# Patient Record
Sex: Female | Born: 1955
Health system: Southern US, Community
[De-identification: ages and names within clinical notes are randomized; demographics above are authoritative.]

## PROBLEM LIST (undated history)

## (undated) DIAGNOSIS — K219 Gastro-esophageal reflux disease without esophagitis: Secondary | ICD-10-CM

## (undated) DIAGNOSIS — T7840XA Allergy, unspecified, initial encounter: Secondary | ICD-10-CM

## (undated) DIAGNOSIS — F32A Depression, unspecified: Secondary | ICD-10-CM

## (undated) DIAGNOSIS — M1990 Unspecified osteoarthritis, unspecified site: Secondary | ICD-10-CM

## (undated) DIAGNOSIS — E669 Obesity, unspecified: Secondary | ICD-10-CM

## (undated) DIAGNOSIS — E782 Mixed hyperlipidemia: Secondary | ICD-10-CM

## (undated) DIAGNOSIS — I10 Essential (primary) hypertension: Secondary | ICD-10-CM

## (undated) DIAGNOSIS — G4700 Insomnia, unspecified: Secondary | ICD-10-CM

## (undated) DIAGNOSIS — F329 Major depressive disorder, single episode, unspecified: Secondary | ICD-10-CM

## (undated) DIAGNOSIS — F419 Anxiety disorder, unspecified: Secondary | ICD-10-CM

## (undated) HISTORY — DX: Mixed hyperlipidemia: E78.2

## (undated) HISTORY — DX: Major depressive disorder, single episode, unspecified: F32.9

## (undated) HISTORY — DX: Insomnia, unspecified: G47.00

## (undated) HISTORY — DX: Gastro-esophageal reflux disease without esophagitis: K21.9

## (undated) HISTORY — DX: Depression, unspecified: F32.A

## (undated) HISTORY — DX: Anxiety disorder, unspecified: F41.9

## (undated) HISTORY — DX: Obesity, unspecified: E66.9

---
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
## 1997-11-02 DIAGNOSIS — I10 Essential (primary) hypertension: Secondary | ICD-10-CM

## 1997-11-02 HISTORY — DX: Essential (primary) hypertension: I10

## 1997-11-16 DIAGNOSIS — F322 Major depressive disorder, single episode, severe without psychotic features: Secondary | ICD-10-CM | POA: Insufficient documentation

## 1998-08-04 DIAGNOSIS — T7840XA Allergy, unspecified, initial encounter: Secondary | ICD-10-CM

## 1998-08-04 HISTORY — DX: Allergy, unspecified, initial encounter: T78.40XA

## 2002-08-04 HISTORY — PX: CHOLECYSTECTOMY: SHX55

## 2005-05-27 ENCOUNTER — Ambulatory Visit: Payer: Self-pay

## 2006-04-27 ENCOUNTER — Ambulatory Visit: Payer: Self-pay | Admitting: Neurology

## 2006-06-03 ENCOUNTER — Ambulatory Visit: Payer: Self-pay

## 2008-08-04 DIAGNOSIS — M1990 Unspecified osteoarthritis, unspecified site: Secondary | ICD-10-CM

## 2008-08-04 HISTORY — DX: Unspecified osteoarthritis, unspecified site: M19.90

## 2010-04-10 ENCOUNTER — Ambulatory Visit: Payer: Self-pay

## 2011-07-01 ENCOUNTER — Ambulatory Visit: Payer: Self-pay | Admitting: "Endocrinology

## 2011-08-05 HISTORY — PX: COLONOSCOPY: SHX174

## 2013-03-03 ENCOUNTER — Ambulatory Visit: Payer: Self-pay | Admitting: Gastroenterology

## 2013-03-03 LAB — HM COLONOSCOPY

## 2013-03-04 LAB — PATHOLOGY REPORT

## 2013-03-21 ENCOUNTER — Ambulatory Visit: Payer: Self-pay | Admitting: Family Medicine

## 2014-09-19 ENCOUNTER — Ambulatory Visit: Payer: Self-pay | Admitting: Family Medicine

## 2014-09-19 DIAGNOSIS — Z1231 Encounter for screening mammogram for malignant neoplasm of breast: Secondary | ICD-10-CM | POA: Diagnosis not present

## 2014-10-05 DIAGNOSIS — F411 Generalized anxiety disorder: Secondary | ICD-10-CM | POA: Diagnosis not present

## 2014-10-10 DIAGNOSIS — K219 Gastro-esophageal reflux disease without esophagitis: Secondary | ICD-10-CM | POA: Diagnosis not present

## 2014-10-10 DIAGNOSIS — Z720 Tobacco use: Secondary | ICD-10-CM | POA: Diagnosis not present

## 2014-10-10 DIAGNOSIS — I10 Essential (primary) hypertension: Secondary | ICD-10-CM | POA: Diagnosis not present

## 2014-10-10 DIAGNOSIS — G250 Essential tremor: Secondary | ICD-10-CM | POA: Diagnosis not present

## 2015-02-20 ENCOUNTER — Encounter: Payer: Self-pay | Admitting: Family Medicine

## 2015-02-20 ENCOUNTER — Other Ambulatory Visit: Payer: Self-pay | Admitting: Family Medicine

## 2015-02-20 ENCOUNTER — Ambulatory Visit (INDEPENDENT_AMBULATORY_CARE_PROVIDER_SITE_OTHER): Payer: Medicare Other | Admitting: Family Medicine

## 2015-02-20 VITALS — BP 101/69 | HR 61 | Temp 97.6°F | Resp 16 | Ht 60.0 in | Wt 180.6 lb

## 2015-02-20 DIAGNOSIS — Z889 Allergy status to unspecified drugs, medicaments and biological substances status: Secondary | ICD-10-CM

## 2015-02-20 DIAGNOSIS — Z9109 Other allergy status, other than to drugs and biological substances: Secondary | ICD-10-CM | POA: Insufficient documentation

## 2015-02-20 DIAGNOSIS — I10 Essential (primary) hypertension: Secondary | ICD-10-CM | POA: Insufficient documentation

## 2015-02-20 DIAGNOSIS — K219 Gastro-esophageal reflux disease without esophagitis: Secondary | ICD-10-CM | POA: Insufficient documentation

## 2015-02-20 MED ORDER — PROPRANOLOL HCL 20 MG PO TABS: 20.0000 mg | ORAL_TABLET | Freq: Two times a day (BID) | ORAL | Status: DC

## 2015-02-20 MED ORDER — OMEPRAZOLE 20 MG PO CPDR: 20.0000 mg | DELAYED_RELEASE_CAPSULE | Freq: Every day | ORAL | Status: DC

## 2015-02-20 NOTE — Progress Notes (Signed)
Name: Elizabeth CollieLoretta Andrews [name]   MRN: [id number]    DOB: 09/04/1955   Date:02/20/2015       Progress Note  Subjective  Chief Complaint  Chief Complaint  Patient presents with  . Hypertension    HPI  For f/u of HBP.  BP at home never above 120 systolic.  Feeling well overall.  Seeing Psych for depression.   Past Medical History  Diagnosis Date  . Anxiety   . Insomnia   . GERD (gastroesophageal reflux disease)   . Obesity   . Depression     History  Substance Use Topics  . Smoking status: Current Every Day Smoker -- 1.50 packs/day for 40 years    Types: Cigarettes  . Smokeless tobacco: Never Used  . Alcohol Use: No     Current outpatient prescriptions:  .  citalopram (CELEXA) 40 MG tablet, , Disp: , Rfl:  .  clonazePAM (KLONOPIN) 0.5 MG tablet, 0.25 mg. , Disp: , Rfl:  .  lisinopril (PRINIVIL,ZESTRIL) 5 MG tablet, , Disp: , Rfl:  .  loratadine (ALLERGY RELIEF) 10 MG tablet, Take 10 mg by mouth daily., Disp: , Rfl:  .  omeprazole (PRILOSEC) 20 MG capsule, , Disp: , Rfl:  .  propranolol (INDERAL) 20 MG tablet, , Disp: , Rfl:  .  QUEtiapine (SEROQUEL) 300 MG tablet, , Disp: , Rfl:   No Known Allergies  Review of Systems  Constitutional: Negative.  Negative for fever, chills, weight loss and malaise/fatigue.  Eyes: Negative.  Negative for blurred vision and double vision.  Respiratory: Negative for cough, sputum production, shortness of breath and wheezing.   Cardiovascular: Negative.  Negative for chest pain, palpitations, orthopnea and leg swelling.  Gastrointestinal: Negative.  Negative for heartburn, abdominal pain, constipation and blood in stool.  Genitourinary: Negative.  Negative for dysuria, urgency and frequency.  Musculoskeletal: Negative.  Negative for myalgias and joint pain.  Skin: Negative.  Negative for rash.  Neurological: Negative.  Negative for dizziness, tingling, tremors, seizures and weakness.  Psychiatric/Behavioral: Positive for depression. The  patient has insomnia.       Objective  Filed Vitals:   02/20/15 1525  BP: 101/69  Pulse: 61  Temp: 97.6 F (36.4 C)  Resp: 16  Height: 5' (1.524 m)  Weight: 180 lb 9.6 oz (81.92 kg)     Physical Exam  Constitutional: She is oriented to person, place, and time and well-developed, well-nourished, and in no distress. No distress.  HENT:  Head: Normocephalic and atraumatic.  Eyes: Conjunctivae and EOM are normal. Pupils are equal, round, and reactive to light. No scleral icterus.  Neck: Normal range of motion. Neck supple. No thyromegaly present.  Cardiovascular: Normal rate, regular rhythm, normal heart sounds and intact distal pulses.  Exam reveals no gallop and no friction rub.   No murmur heard. Pulmonary/Chest: Effort normal and breath sounds normal. No respiratory distress. She has no wheezes. She has no rales.  Abdominal: Soft. Bowel sounds are normal. She exhibits no mass. There is no tenderness.  Musculoskeletal: She exhibits no edema.  Lymphadenopathy:    She has no cervical adenopathy.  Neurological: She is alert and oriented to person, place, and time. No cranial nerve deficit.  Psychiatric:  Affect is slightly depressed.  Vitals reviewed.       Assessment & Plan  1. Essential hypertension -discontinue Lisinopril  2. Gastroesophageal reflux disease without esophagitis   3. Multiple allergies

## 2015-03-07 DIAGNOSIS — H5213 Myopia, bilateral: Secondary | ICD-10-CM | POA: Diagnosis not present

## 2015-05-22 ENCOUNTER — Encounter: Payer: Self-pay | Admitting: Family Medicine

## 2015-05-23 ENCOUNTER — Ambulatory Visit: Payer: Medicare Other | Admitting: Family Medicine

## 2015-05-30 ENCOUNTER — Encounter: Payer: Self-pay | Admitting: Family Medicine

## 2015-05-30 ENCOUNTER — Ambulatory Visit (INDEPENDENT_AMBULATORY_CARE_PROVIDER_SITE_OTHER): Payer: Medicare Other | Admitting: Family Medicine

## 2015-05-30 VITALS — BP 122/79 | HR 65 | Temp 97.6°F | Resp 16 | Ht 60.0 in | Wt 180.6 lb

## 2015-05-30 DIAGNOSIS — Z23 Encounter for immunization: Secondary | ICD-10-CM | POA: Diagnosis not present

## 2015-05-30 DIAGNOSIS — Z0000 Encounter for general adult medical examination without abnormal findings: Secondary | ICD-10-CM

## 2015-05-30 NOTE — Progress Notes (Signed)
Patient: Elizabeth [name], Female    DOB: 01/23/1956, 59 y.o.   MRN: [id number] Visit Date: 05/30/2015  Today's Provider: Fidel LevyJames Hawkins Jr, MD  Chief Complaint  Patient presents with  . Medicare Wellness    Initial  . Manic Behavior    bipolar disorder- disabled    Subjective:   Initial preventative physical exam Elizabeth [name] is a 59 y.o. female who presents today for her Initial Preventative Physical Exam. She feels well. She reports exercising . She reports she is sleeping fairly well.  HPI  Review of Systems  Social History   Social History  . Marital Status: Widowed    Spouse Name: N/A  . Number of Children: N/A  . Years of Education: N/A   Occupational History  . Not on file.   Social History Main Topics  . Smoking status: Current Every Day Smoker -- 1.50 packs/day for 40 years    Types: Cigarettes  . Smokeless tobacco: Never Used  . Alcohol Use: No  . Drug Use: No  . Sexual Activity: Yes    Birth Control/ Protection: Injection   Other Topics Concern  . Not on file   Social History Narrative    Patient Active Problem List   Diagnosis Date Noted  . Hypertension 02/20/2015  . GERD (gastroesophageal reflux disease) 02/20/2015  . Multiple allergies 02/20/2015    Past Surgical History  Procedure Laterality Date  . Cholecystectomy  2004    Her family history includes Alzheimer's disease in her mother; Hypertension in her father and sister; Multiple sclerosis in her sister; Stroke in her father.    Previous Medications   CITALOPRAM (CELEXA) 40 MG TABLET       CLONAZEPAM (KLONOPIN) 0.5 MG TABLET    0.25 mg.    LORATADINE (ALLERGY RELIEF) 10 MG TABLET    Take 10 mg by mouth daily.   OMEPRAZOLE (PRILOSEC) 20 MG CAPSULE    Take 1 capsule (20 mg total) by mouth daily.   PROPRANOLOL (INDERAL) 20 MG TABLET    Take 1 tablet (20 mg total) by mouth 2 (two) times daily.   QUETIAPINE (SEROQUEL) 300 MG TABLET        Patient Care Team: Janeann ForehandJames H  Hawkins Jr., MD as PCP - General (Family Medicine)     Objective:   Vitals: BP 122/79 mmHg  Pulse 65  Temp(Src) 97.6 F (36.4 C) (Oral)  Resp 16  Ht 5' (1.524 m)  Wt 180 lb 9.6 oz (81.92 kg)  BMI 35.27 kg/m2  Physical Exam    Hearing Screening   125Hz  250Hz  500Hz  1000Hz  2000Hz  4000Hz  8000Hz   Right ear:   Pass Pass Pass Pass   Left ear:   Pass Pass Pass Pass   Vision Screening Comments: Pt has eye exam done in August and was normal.  Activities of Daily Living In your present state of health, do you have any difficulty performing the following activities: 05/30/2015 02/20/2015  Hearing? N N  Vision? N N  Difficulty concentrating or making decisions? N N  Walking or climbing stairs? N N  Dressing or bathing? N N  Doing errands, shopping? N N  Preparing Food and eating ? N -  Using the Toilet? N -  In the past six months, have you accidently leaked urine? N -  Do you have problems with loss of bowel control? N -  Managing your Medications? N -  Managing your Finances? N -  Housekeeping or managing your Housekeeping? N -  Fall Risk Assessment Fall Risk  05/30/2015 05/30/2015  Falls in the past year? No No     Patient reports there are not safety devices in place in shower at home.   Depression Screen PHQ 2/9 Scores 05/30/2015 02/20/2015  PHQ - 2 Score 3 1  PHQ- 9 Score 9 7    MMSE MMSE - Mini Mental State Exam 05/30/2015  Orientation to time 5  Orientation to Place 5  Registration 3  Attention/ Calculation 5  Recall 3  Language- name 2 objects 2  Language- repeat 1  Language- follow 3 step command 3  Language- read & follow direction 1  Write a sentence 1  Copy design 1  Total score 30      Assessment & Plan:     Initial Preventative Physical Exam  Reviewed patient's Family Medical History Reviewed and updated list of patient's medical providers Assessment of cognitive impairment was done Assessed patient's functional ability Established a  written schedule for health screening services Health Risk Assessent Completed and Reviewed  Exercise Activities and Dietary recommendations Goals    . Weight < 200 lb (90.719 kg)     Pt wants to loose 20 pound in one year.       Immunization History  Administered Date(s) Administered  . Pneumococcal Polysaccharide-23 02/25/2013  . Tdap 02/25/2013    Health Maintenance  Topic Date Due  . INFLUENZA VACCINE  03/04/2016  . MAMMOGRAM  07/04/2016  . PAP SMEAR  06/04/2017  . TETANUS/TDAP  02/26/2023  . COLONOSCOPY  03/05/2023  . Hepatitis C Screening  Completed  . HIV Screening  Completed      Discussed health benefits of physical activity, and encouraged her to engage in regular exercise appropriate for her age and condition.    ------------------------------------------------------------------------------------------------------------   Problem List Items Addressed This Visit    None       Venora Maples, MD [facility]  05/30/2015  1. Medicare annual wellness visit, initial   2. Need for influenza vaccination  - Flu Vaccine QUAD 36+ mos PF IM (Fluarix & Fluzone Quad PF)

## 2015-05-30 NOTE — Patient Instructions (Signed)
Health Maintenance  Topic Date Due  . INFLUENZA VACCINE  03/04/2016  . MAMMOGRAM  07/04/2016  . PAP SMEAR  06/04/2017  . TETANUS/TDAP  02/26/2023  . COLONOSCOPY  03/05/2023  . Hepatitis C Screening  Completed  . HIV Screening  Completed

## 2015-07-11 ENCOUNTER — Ambulatory Visit (INDEPENDENT_AMBULATORY_CARE_PROVIDER_SITE_OTHER): Payer: Medicare Other | Admitting: Family Medicine

## 2015-07-11 ENCOUNTER — Encounter: Payer: Self-pay | Admitting: Family Medicine

## 2015-07-11 VITALS — BP 120/82 | HR 61 | Resp 16 | Ht 60.0 in | Wt 184.5 lb

## 2015-07-11 DIAGNOSIS — Z0000 Encounter for general adult medical examination without abnormal findings: Secondary | ICD-10-CM | POA: Diagnosis not present

## 2015-07-11 DIAGNOSIS — K5792 Diverticulitis of intestine, part unspecified, without perforation or abscess without bleeding: Secondary | ICD-10-CM | POA: Diagnosis not present

## 2015-07-11 DIAGNOSIS — F32A Depression, unspecified: Secondary | ICD-10-CM

## 2015-07-11 DIAGNOSIS — I10 Essential (primary) hypertension: Secondary | ICD-10-CM

## 2015-07-11 DIAGNOSIS — F329 Major depressive disorder, single episode, unspecified: Secondary | ICD-10-CM | POA: Diagnosis not present

## 2015-07-11 DIAGNOSIS — K219 Gastro-esophageal reflux disease without esophagitis: Secondary | ICD-10-CM

## 2015-07-11 DIAGNOSIS — F39 Unspecified mood [affective] disorder: Secondary | ICD-10-CM | POA: Insufficient documentation

## 2015-07-11 NOTE — Progress Notes (Signed)
Name: Elizabeth [name]   MRN: [id number]    DOB: 1955-11-20   Date:07/11/2015       Progress Note  Subjective  Chief Complaint  Chief Complaint  Patient presents with  . Annual Exam    Bladder Control is an issue     HPI Here for annual complete physical.  Has depression and sees Psychiatrist.  Also with tremors an GERD.  Doing well overall.    No problem-specific assessment & plan notes found for this encounter.   Past Medical History  Diagnosis Date  . Anxiety   . Insomnia   . GERD (gastroesophageal reflux disease)   . Obesity   . Depression     Past Surgical History  Procedure Laterality Date  . Colonoscopy  08/05/2011  . Cholecystectomy  2004    repeat 2014    Family History  Problem Relation Age of Onset  . Alzheimer's disease Mother   . Stroke Father   . Hypertension Father   . Multiple sclerosis Sister   . Hypertension Sister     Social History   Social History  . Marital Status: Widowed    Spouse Name: N/A  . Number of Children: N/A  . Years of Education: N/A   Occupational History  . Not on file.   Social History Main Topics  . Smoking status: Current Every Day Smoker -- 1.50 packs/day for 40 years    Types: Cigarettes  . Smokeless tobacco: Never Used  . Alcohol Use: No  . Drug Use: No  . Sexual Activity: Yes    Birth Control/ Protection: Injection   Other Topics Concern  . Not on file   Social History Narrative     Current outpatient prescriptions:  .  aspirin 81 MG tablet, Take 81 mg by mouth daily., Disp: , Rfl:  .  citalopram (CELEXA) 40 MG tablet, , Disp: , Rfl:  .  clonazePAM (KLONOPIN) 0.5 MG tablet, 0.25 mg. , Disp: , Rfl:  .  loratadine (ALLERGY RELIEF) 10 MG tablet, Take 10 mg by mouth daily., Disp: , Rfl:  .  omeprazole (PRILOSEC) 20 MG capsule, Take 1 capsule (20 mg total) by mouth daily., Disp: 30 capsule, Rfl: 12 .  propranolol (INDERAL) 20 MG tablet, Take 1 tablet (20 mg total) by mouth 2 (two) times daily., Disp:  60 tablet, Rfl: 12 .  QUEtiapine (SEROQUEL) 300 MG tablet, Take 150 mg by mouth at bedtime. 1/2 tab, Disp: , Rfl:   No Known Allergies   Review of Systems  Constitutional: Negative for fever, chills, weight loss and malaise/fatigue.  HENT: Positive for congestion. Negative for hearing loss.   Eyes: Negative for blurred vision and double vision.  Respiratory: Negative for cough, shortness of breath and wheezing.   Cardiovascular: Negative for chest pain, palpitations and leg swelling.  Gastrointestinal: Positive for nausea, diarrhea and blood in stool. Negative for heartburn, abdominal pain and constipation.  Genitourinary: Negative for dysuria, urgency and frequency.  Skin: Negative for itching and rash.  Neurological: Negative for dizziness, tremors, weakness and headaches.  Psychiatric/Behavioral: Positive for depression. The patient is nervous/anxious.       Objective  Filed Vitals:   07/11/15 1520  BP: 120/82  Pulse: 61  Resp: 16  Height: 5' (1.524 m)  Weight: 184 lb 8 oz (83.689 kg)    Physical Exam  Constitutional: She is oriented to person, place, and time. No distress.  HENT:  Head: Normocephalic and atraumatic.  Right Ear: External ear normal.  Left Ear: External ear normal.  Nose: Nose normal.  Mouth/Throat: Oropharynx is clear and moist.  Eyes: Conjunctivae and EOM are normal. Pupils are equal, round, and reactive to light. No scleral icterus.  Fundoscopic exam:      The right eye shows no arteriolar narrowing, no AV nicking, no exudate, no hemorrhage and no papilledema.       The left eye shows no arteriolar narrowing, no AV nicking, no exudate, no hemorrhage and no papilledema.  Neck: Normal range of motion. Neck supple. Carotid bruit is not present. No thyromegaly present.  Cardiovascular: Normal rate, regular rhythm, normal heart sounds and intact distal pulses.  Exam reveals no gallop and no friction rub.   No murmur heard. Pulmonary/Chest: Effort normal  and breath sounds normal. No respiratory distress. She has no wheezes. She has no rales. She exhibits no tenderness. Right breast exhibits no inverted nipple, no mass, no nipple discharge, no skin change and no tenderness. Left breast exhibits no inverted nipple, no mass, no nipple discharge, no skin change and no tenderness. Breasts are symmetrical.  Abdominal: Soft. Bowel sounds are normal. She exhibits no distension, no abdominal bruit and no mass. There is no tenderness.  Genitourinary: Vagina normal, uterus normal, cervix normal, right adnexa normal and left adnexa normal.  Bimanual exam only.  Musculoskeletal: Normal range of motion. She exhibits no edema.  Lymphadenopathy:    She has no cervical adenopathy.  Neurological: She is alert and oriented to person, place, and time. No cranial nerve deficit. Gait normal. Coordination normal.  Skin: Skin is warm and dry.  Vitals reviewed.      No results found for this or any previous visit (from the past 2160 hour(s)).   Assessment & Plan  Problem List Items Addressed This Visit      Cardiovascular and Mediastinum   Hypertension   Relevant Medications   aspirin 81 MG tablet   Other Relevant Orders   Comprehensive Metabolic Panel (CMET)   Lipid Profile     Digestive   GERD (gastroesophageal reflux disease)   Relevant Orders   CBC with Differential   B12   Folate     Other   Diverticulitis   Annual physical exam - Primary   Relevant Orders   MM Digital Screening   Depression   Relevant Orders   TSH   Vitamin D (25 hydroxy)      Meds ordered this encounter  Medications  . aspirin 81 MG tablet    Sig: Take 81 mg by mouth daily.   1. Annual physical exam  - MM Digital Screening; Future  2. Essential hypertension -conmt. Atenolol - Comprehensive Metabolic Panel (CMET) - Lipid Profile  3. Diverticulitis of intestine without perforation or abscess without bleeding   4. Gastroesophageal reflux disease without  esophagitis -cont. POmeprazole - CBC with Differential - B12 - Folate  5. Depression -cont. To see Psych. - TSH - Vitamin D (25 hydroxy)

## 2015-08-08 DIAGNOSIS — I10 Essential (primary) hypertension: Secondary | ICD-10-CM | POA: Diagnosis not present

## 2015-08-08 DIAGNOSIS — K219 Gastro-esophageal reflux disease without esophagitis: Secondary | ICD-10-CM | POA: Diagnosis not present

## 2015-08-09 LAB — CBC WITH DIFFERENTIAL/PLATELET
BASOS ABS: 0.1 10*3/uL (ref 0.0–0.2)
Basos: 1 %
EOS (ABSOLUTE): 0.2 10*3/uL (ref 0.0–0.4)
EOS: 2 %
HEMATOCRIT: 44.2 % (ref 34.0–46.6)
HEMOGLOBIN: 15.1 g/dL (ref 11.1–15.9)
IMMATURE GRANS (ABS): 0 10*3/uL (ref 0.0–0.1)
Immature Granulocytes: 0 %
LYMPHS: 22 %
Lymphocytes Absolute: 1.6 10*3/uL (ref 0.7–3.1)
MCH: 31.7 pg (ref 26.6–33.0)
MCHC: 34.2 g/dL (ref 31.5–35.7)
MCV: 93 fL (ref 79–97)
MONOCYTES: 6 %
Monocytes Absolute: 0.5 10*3/uL (ref 0.1–0.9)
NEUTROS ABS: 5 10*3/uL (ref 1.4–7.0)
Neutrophils: 69 %
Platelets: 396 10*3/uL — ABNORMAL HIGH (ref 150–379)
RBC: 4.77 x10E6/uL (ref 3.77–5.28)
RDW: 13.6 % (ref 12.3–15.4)
WBC: 7.3 10*3/uL (ref 3.4–10.8)

## 2015-08-09 LAB — COMPREHENSIVE METABOLIC PANEL
ALK PHOS: 89 IU/L (ref 39–117)
ALT: 9 IU/L (ref 0–32)
AST: 16 IU/L (ref 0–40)
Albumin/Globulin Ratio: 1.6 (ref 1.1–2.5)
Albumin: 4.5 g/dL (ref 3.5–5.5)
BUN/Creatinine Ratio: 12 (ref 9–23)
BUN: 16 mg/dL (ref 6–24)
Bilirubin Total: 0.7 mg/dL (ref 0.0–1.2)
CALCIUM: 9.9 mg/dL (ref 8.7–10.2)
CHLORIDE: 100 mmol/L (ref 96–106)
CO2: 21 mmol/L (ref 18–29)
CREATININE: 1.35 mg/dL — AB (ref 0.57–1.00)
GFR calc Af Amer: 50 mL/min/{1.73_m2} — ABNORMAL LOW (ref >59)
GFR, EST NON AFRICAN AMERICAN: 43 mL/min/{1.73_m2} — AB (ref >59)
GLOBULIN, TOTAL: 2.9 g/dL (ref 1.5–4.5)
GLUCOSE: 88 mg/dL (ref 65–99)
POTASSIUM: 4.9 mmol/L (ref 3.5–5.2)
SODIUM: 142 mmol/L (ref 134–144)
Total Protein: 7.4 g/dL (ref 6.0–8.5)

## 2015-08-09 LAB — FOLATE: Folate: 13.7 ng/mL (ref >3.0)

## 2015-08-09 LAB — LIPID PANEL
CHOL/HDL RATIO: 4.4 ratio (ref 0.0–4.4)
CHOLESTEROL TOTAL: 253 mg/dL — AB (ref 100–199)
HDL: 58 mg/dL (ref >39)
LDL Calculated: 173 mg/dL — ABNORMAL HIGH (ref 0–99)
TRIGLYCERIDES: 108 mg/dL (ref 0–149)
VLDL Cholesterol Cal: 22 mg/dL (ref 5–40)

## 2015-08-09 LAB — ABN OPTION 3

## 2015-08-09 LAB — VITAMIN B12: VITAMIN B 12: 410 pg/mL (ref 211–946)

## 2015-08-09 LAB — TSH: TSH: 3.7 u[IU]/mL (ref 0.450–4.500)

## 2015-08-09 LAB — VITAMIN D 25 HYDROXY (VIT D DEFICIENCY, FRACTURES): VIT D 25 HYDROXY: 14.1 ng/mL — AB (ref 30.0–100.0)

## 2015-09-25 ENCOUNTER — Ambulatory Visit: Payer: Medicare Other

## 2015-10-02 ENCOUNTER — Other Ambulatory Visit: Payer: Self-pay | Admitting: Family Medicine

## 2015-10-02 ENCOUNTER — Encounter: Payer: Self-pay | Admitting: Family Medicine

## 2015-10-02 DIAGNOSIS — R899 Unspecified abnormal finding in specimens from other organs, systems and tissues: Secondary | ICD-10-CM

## 2015-10-09 ENCOUNTER — Ambulatory Visit
Admission: RE | Admit: 2015-10-09 | Discharge: 2015-10-09 | Disposition: A | Discharge status: Home / Self Care | Payer: Medicare Other | Source: Ambulatory Visit | Attending: Family Medicine | Admitting: Family Medicine

## 2015-10-09 DIAGNOSIS — Z0000 Encounter for general adult medical examination without abnormal findings: Secondary | ICD-10-CM

## 2015-10-09 DIAGNOSIS — Z1231 Encounter for screening mammogram for malignant neoplasm of breast: Secondary | ICD-10-CM | POA: Diagnosis not present

## 2015-10-23 ENCOUNTER — Encounter: Payer: Self-pay | Admitting: Family Medicine

## 2015-12-12 DIAGNOSIS — R899 Unspecified abnormal finding in specimens from other organs, systems and tissues: Secondary | ICD-10-CM | POA: Diagnosis not present

## 2015-12-13 LAB — COMPREHENSIVE METABOLIC PANEL
ALK PHOS: 78 IU/L (ref 39–117)
ALT: 13 IU/L (ref 0–32)
AST: 13 IU/L (ref 0–40)
Albumin/Globulin Ratio: 1.5 (ref 1.2–2.2)
Albumin: 4.1 g/dL (ref 3.6–4.8)
BILIRUBIN TOTAL: 0.3 mg/dL (ref 0.0–1.2)
BUN / CREAT RATIO: 9 — AB (ref 12–28)
BUN: 11 mg/dL (ref 8–27)
CALCIUM: 9.4 mg/dL (ref 8.7–10.3)
CHLORIDE: 102 mmol/L (ref 96–106)
CO2: 21 mmol/L (ref 18–29)
CREATININE: 1.24 mg/dL — AB (ref 0.57–1.00)
GFR calc Af Amer: 55 mL/min/{1.73_m2} — ABNORMAL LOW (ref >59)
GFR calc non Af Amer: 47 mL/min/{1.73_m2} — ABNORMAL LOW (ref >59)
Globulin, Total: 2.7 g/dL (ref 1.5–4.5)
Glucose: 102 mg/dL — ABNORMAL HIGH (ref 65–99)
Potassium: 4.2 mmol/L (ref 3.5–5.2)
Sodium: 139 mmol/L (ref 134–144)
TOTAL PROTEIN: 6.8 g/dL (ref 6.0–8.5)

## 2015-12-14 ENCOUNTER — Encounter: Payer: Self-pay | Admitting: Family Medicine

## 2016-01-22 ENCOUNTER — Ambulatory Visit (INDEPENDENT_AMBULATORY_CARE_PROVIDER_SITE_OTHER): Payer: Medicare Other | Admitting: Family Medicine

## 2016-01-22 ENCOUNTER — Encounter: Payer: Self-pay | Admitting: Family Medicine

## 2016-01-22 VITALS — BP 130/80 | HR 63 | Temp 98.3°F | Resp 16 | Ht 60.0 in | Wt 183.0 lb

## 2016-01-22 DIAGNOSIS — I10 Essential (primary) hypertension: Secondary | ICD-10-CM

## 2016-01-22 DIAGNOSIS — K219 Gastro-esophageal reflux disease without esophagitis: Secondary | ICD-10-CM | POA: Diagnosis not present

## 2016-01-22 DIAGNOSIS — F329 Major depressive disorder, single episode, unspecified: Secondary | ICD-10-CM

## 2016-01-22 DIAGNOSIS — F32A Depression, unspecified: Secondary | ICD-10-CM

## 2016-01-22 MED ORDER — OMEPRAZOLE 20 MG PO CPDR: 20.0000 mg | DELAYED_RELEASE_CAPSULE | Freq: Every day | ORAL | Status: DC

## 2016-01-22 MED ORDER — PROPRANOLOL HCL 20 MG PO TABS: 20.0000 mg | ORAL_TABLET | Freq: Two times a day (BID) | ORAL | Status: DC

## 2016-01-22 NOTE — Patient Instructions (Signed)
Plan CMP, CBC, Lipid panel on return. 

## 2016-01-22 NOTE — Progress Notes (Signed)
Name: Elizabeth [name]   MRN: [id number]    DOB: 10/01/1955   Date:01/22/2016       Progress Note  Subjective  Chief Complaint  Chief Complaint  Patient presents with  . Hypertension    HPI Here for f/u of HBP.  Also has bipolar disorder and sees Psych for this.  GERD doing well.  No problem-specific assessment & plan notes found for this encounter.   Past Medical History  Diagnosis Date  . Anxiety   . Insomnia   . GERD (gastroesophageal reflux disease)   . Obesity   . Depression     Past Surgical History  Procedure Laterality Date  . Colonoscopy  08/05/2011  . Cholecystectomy  2004    repeat 2014    Family History  Problem Relation Age of Onset  . Alzheimer's disease Mother   . Stroke Father   . Hypertension Father   . Multiple sclerosis Sister   . Hypertension Sister   . Breast cancer Maternal Aunt [id number]    Social History   Social History  . Marital Status: Widowed    Spouse Name: N/A  . Number of Children: N/A  . Years of Education: N/A   Occupational History  . Not on file.   Social History Main Topics  . Smoking status: Current Every Day Smoker -- 1.50 packs/day for 40 years    Types: Cigarettes  . Smokeless tobacco: Never Used  . Alcohol Use: No  . Drug Use: No  . Sexual Activity: Yes    Birth Control/ Protection: Injection   Other Topics Concern  . Not on file   Social History Narrative     Current outpatient prescriptions:  .  aspirin 81 MG tablet, Take 81 mg by mouth daily., Disp: , Rfl:  .  Cholecalciferol (VITAMIN D-3) 1000 units CAPS, Take 1 capsule by mouth daily., Disp: , Rfl:  .  citalopram (CELEXA) 40 MG tablet, , Disp: , Rfl:  .  loratadine (ALLERGY RELIEF) 10 MG tablet, Take 10 mg by mouth daily., Disp: , Rfl:  .  Lurasidone HCl (LATUDA) 60 MG TABS, Take by mouth., Disp: , Rfl:  .  omeprazole (PRILOSEC) 20 MG capsule, Take 1 capsule (20 mg total) by mouth daily., Disp: 30 capsule, Rfl: 12 .  propranolol (INDERAL) 20 MG  tablet, Take 1 tablet (20 mg total) by mouth 2 (two) times daily., Disp: 60 tablet, Rfl: 12  Not on File   Review of Systems  Constitutional: Negative for fever, chills, weight loss and malaise/fatigue.  HENT: Negative for hearing loss.   Eyes: Negative for blurred vision and double vision.  Respiratory: Negative for cough, shortness of breath and wheezing.   Cardiovascular: Negative for chest pain, palpitations and leg swelling.  Gastrointestinal: Negative for heartburn, abdominal pain and blood in stool.  Genitourinary: Negative for dysuria, urgency and frequency.  Musculoskeletal: Negative for myalgias and joint pain.  Skin: Negative for rash.  Neurological: Negative for dizziness, tremors, weakness and headaches.  Psychiatric/Behavioral: Negative for depression. The patient is not nervous/anxious.       Objective  Filed Vitals:   01/22/16 1333 01/22/16 1432  BP: 136/84 130/80  Pulse: 63   Temp: 98.3 F (36.8 C)   TempSrc: Oral   Resp: 16   Height: 5' (1.524 m)   Weight: 183 lb (83.008 kg)     Physical Exam  Constitutional: She is oriented to person, place, and time and well-developed, well-nourished, and in no distress. No distress.  HENT:  Head: Normocephalic and atraumatic.  Eyes: Conjunctivae and EOM are normal. Pupils are equal, round, and reactive to light. No scleral icterus.  Neck: Normal range of motion. Neck supple. No thyromegaly present.  Cardiovascular: Normal rate, regular rhythm and normal heart sounds.  Exam reveals no gallop and no friction rub.   No murmur heard. Pulmonary/Chest: Effort normal and breath sounds normal. No respiratory distress. She has no wheezes. She has no rales.  Musculoskeletal: She exhibits edema.  Lymphadenopathy:    She has no cervical adenopathy.  Neurological: She is alert and oriented to person, place, and time.  Psychiatric:  Affect ok today  Vitals reviewed.      Recent Results (from the past 2160 hour(s))   Comprehensive metabolic panel     Status: Abnormal   Collection Time: 12/12/15  9:54 AM  Result Value Ref Range   Glucose 102 (H) 65 - 99 mg/dL   BUN 11 8 - 27 mg/dL   Creatinine, Ser 1.61 (H) 0.57 - 1.00 mg/dL   GFR calc non Af Amer 47 (L) >59 mL/min/1.73   GFR calc Af Amer 55 (L) >59 mL/min/1.73   BUN/Creatinine Ratio 9 (L) 12 - 28   Sodium 139 134 - 144 mmol/L   Potassium 4.2 3.5 - 5.2 mmol/L   Chloride 102 96 - 106 mmol/L   CO2 21 18 - 29 mmol/L   Calcium 9.4 8.7 - 10.3 mg/dL   Total Protein 6.8 6.0 - 8.5 g/dL   Albumin 4.1 3.6 - 4.8 g/dL   Globulin, Total 2.7 1.5 - 4.5 g/dL   Albumin/Globulin Ratio 1.5 1.2 - 2.2   Bilirubin Total 0.3 0.0 - 1.2 mg/dL   Alkaline Phosphatase 78 39 - 117 IU/L   AST 13 0 - 40 IU/L   ALT 13 0 - 32 IU/L     Assessment & Plan  Problem List Items Addressed This Visit      Cardiovascular and Mediastinum   Hypertension - Primary   Relevant Medications   propranolol (INDERAL) 20 MG tablet     Digestive   GERD (gastroesophageal reflux disease)   Relevant Medications   omeprazole (PRILOSEC) 20 MG capsule     Other   Depression      Meds ordered this encounter  Medications  . Lurasidone HCl (LATUDA) 60 MG TABS    Sig: Take by mouth.  . Cholecalciferol (VITAMIN D-3) 1000 units CAPS    Sig: Take 1 capsule by mouth daily.  . propranolol (INDERAL) 20 MG tablet    Sig: Take 1 tablet (20 mg total) by mouth 2 (two) times daily.    Dispense:  60 tablet    Refill:  12  . omeprazole (PRILOSEC) 20 MG capsule    Sig: Take 1 capsule (20 mg total) by mouth daily.    Dispense:  30 capsule    Refill:  12   1. Essential hypertension  - propranolol (INDERAL) 20 MG tablet; Take 1 tablet (20 mg total) by mouth 2 (two) times daily.  Dispense: 60 tablet; Refill: 12  2. Gastroesophageal reflux disease without esophagitis  - omeprazole (PRILOSEC) 20 MG capsule; Take 1 capsule (20 mg total) by mouth daily.  Dispense: 30 capsule; Refill: 12  3.  Depression Cont to see Psych and tgake meds from him.

## 2016-01-29 ENCOUNTER — Telehealth: Payer: Medicare Other | Admitting: Family

## 2016-01-29 ENCOUNTER — Encounter: Payer: Self-pay | Admitting: Family Medicine

## 2016-01-29 DIAGNOSIS — J3089 Other allergic rhinitis: Secondary | ICD-10-CM

## 2016-01-29 MED ORDER — FLUTICASONE PROPIONATE 50 MCG/ACT NA SUSP: 2.0000 | Freq: Every day | NASAL | Status: DC

## 2016-01-29 NOTE — Progress Notes (Signed)
E visit for Allergic Rhinitis We are sorry that you are not feeling well.  Her is how we plan to help! You are not contagious.   Based on what you have shared with me it looks like you have Allergic Rhinitis.  Rhinitis is when a reaction occurs that causes nasal congestion, runny nose, sneezing, and itching.  Most types of rhinitis are caused by an inflammation and are associated with symptoms in the eyes ears or throat. There are several types of rhinitis.  The most common are acute rhinitis, which is usually caused by a viral illness, allergic or seasonal rhinitis, and nonallergic or year-round rhinitis.  Nasal allergies occur certain times of the year.  Allergic rhinitis is caused when allergens in the air trigger the release of histamine in the body.  Histamine causes itching, swelling, and fluid to build up in the fragile linings of the nasal passages, sinuses and eyelids.  An itchy nose and clear discharge are common.  I recommend the following over the counter treatments: You should take a daily dose of antihistamine  I also would recommend a nasal spray: Flonase 2 sprays into each nostril once daily   HOME CARE:   You can use an over-the-counter saline nasal spray as needed  Avoid areas where there is heavy dust, mites, or molds  Stay indoors on windy days during the pollen season  Keep windows closed in home, at least in bedroom; use air conditioner.  Use high-efficiency house air filter  Keep windows closed in car, turn AC on re-circulate  Avoid playing out with dog during pollen season  GET HELP RIGHT AWAY IF:   If your symptoms do not improve within 10 days  You become short of breath  You develop yellow or green discharge from your nose for over 3 days  You have coughing fits  MAKE SURE YOU:   Understand these instructions  Will watch your condition  Will get help right away if you are not doing well or get worse  Thank you for choosing an e-visit. Your  e-visit answers were reviewed by a board certified advanced clinical practitioner to complete your personal care plan. Depending upon the condition, your plan could have included both over the counter or prescription medications. Please review your pharmacy choice. Be sure that the pharmacy you have chosen is open so that you can pick up your prescription now.  If there is a problem you may message your provider in MyChart to have the prescription routed to another pharmacy. Your safety is important to us. If you have drug allergies check your prescription carefully.  For the next 24 hours, you can use MyChart to ask questions about today's visit, request a non-urgent call back, or ask for a work or school excuse from your e-visit provider. You will get an email in the next two days asking about your experience. I hope that your e-visit has been valuable and will speed your recovery.

## 2016-02-20 ENCOUNTER — Encounter: Payer: Self-pay | Admitting: Family Medicine

## 2016-04-29 ENCOUNTER — Ambulatory Visit (INDEPENDENT_AMBULATORY_CARE_PROVIDER_SITE_OTHER): Payer: Medicare Other | Admitting: Family Medicine

## 2016-04-29 ENCOUNTER — Encounter: Payer: Self-pay | Admitting: Family Medicine

## 2016-04-29 VITALS — BP 130/85 | HR 71 | Temp 99.2°F | Resp 16 | Ht 60.0 in | Wt 182.0 lb

## 2016-04-29 DIAGNOSIS — Z23 Encounter for immunization: Secondary | ICD-10-CM | POA: Diagnosis not present

## 2016-04-29 DIAGNOSIS — I10 Essential (primary) hypertension: Secondary | ICD-10-CM

## 2016-04-29 DIAGNOSIS — K219 Gastro-esophageal reflux disease without esophagitis: Secondary | ICD-10-CM

## 2016-04-29 MED ORDER — OMEPRAZOLE 20 MG PO CPDR: 20.0000 mg | DELAYED_RELEASE_CAPSULE | Freq: Every day | ORAL | 12 refills | Status: DC

## 2016-04-29 MED ORDER — PROPRANOLOL HCL 20 MG PO TABS: 20.0000 mg | ORAL_TABLET | Freq: Two times a day (BID) | ORAL | 12 refills | Status: DC

## 2016-04-29 NOTE — Progress Notes (Signed)
Name: Elizabeth [name]   MRN: [id number]    DOB: 12-13-1955   Date:04/29/2016       Progress Note  Subjective  Chief Complaint  Chief Complaint  Patient presents with  . Hypertension    HPI Here for f/u of HBP and GERD.  She has bipolar disorder and is doing well on her current meds.    No problem-specific Assessment & Plan notes found for this encounter.   Past Medical History:  Diagnosis Date  . Anxiety   . Depression   . GERD (gastroesophageal reflux disease)   . Insomnia   . Obesity     Past Surgical History:  Procedure Laterality Date  . CHOLECYSTECTOMY  2004   repeat 2014  . COLONOSCOPY  08/05/2011    Family History  Problem Relation Age of Onset  . Alzheimer's disease Mother   . Stroke Father   . Hypertension Father   . Multiple sclerosis Sister   . Hypertension Sister   . Breast cancer Maternal Aunt 46    Social History   Social History  . Marital status: Widowed    Spouse name: N/A  . Number of children: N/A  . Years of education: N/A   Occupational History  . Not on file.   Social History Main Topics  . Smoking status: Current Every Day Smoker    Packs/day: 1.50    Years: 40.00    Types: Cigarettes  . Smokeless tobacco: Never Used  . Alcohol use No  . Drug use: No  . Sexual activity: Yes    Birth control/ protection: Injection   Other Topics Concern  . Not on file   Social History Narrative  . No narrative on file     Current Outpatient Prescriptions:  .  aspirin 81 MG tablet, Take 81 mg by mouth daily., Disp: , Rfl:  .  Cholecalciferol (VITAMIN D-3) 1000 units CAPS, Take 1 capsule by mouth daily., Disp: , Rfl:  .  citalopram (CELEXA) 40 MG tablet, , Disp: , Rfl:  .  fluticasone (FLONASE) 50 MCG/ACT nasal spray, Place 2 sprays into both nostrils daily., Disp: 16 g, Rfl: 0 .  loratadine (ALLERGY RELIEF) 10 MG tablet, Take 10 mg by mouth daily., Disp: , Rfl:  .  Lurasidone HCl (LATUDA) 60 MG TABS, Take by mouth., Disp: , Rfl:   .  omeprazole (PRILOSEC) 20 MG capsule, Take 1 capsule (20 mg total) by mouth daily., Disp: 30 capsule, Rfl: 12 .  propranolol (INDERAL) 20 MG tablet, Take 1 tablet (20 mg total) by mouth 2 (two) times daily., Disp: 60 tablet, Rfl: 12  Not on File   Review of Systems  Constitutional: Negative for chills, fever, malaise/fatigue and weight loss.  HENT: Negative for hearing loss.   Eyes: Negative for blurred vision and double vision.  Respiratory: Negative for cough, shortness of breath and wheezing.   Cardiovascular: Negative for chest pain, palpitations and leg swelling.  Gastrointestinal: Negative for abdominal pain, blood in stool, constipation and heartburn.  Genitourinary: Negative for dysuria, frequency and urgency.  Musculoskeletal: Negative for joint pain and myalgias.  Skin: Negative for rash.  Neurological: Negative for dizziness, tremors, weakness and headaches.  Psychiatric/Behavioral: Negative for depression. The patient is not nervous/anxious.       Objective  Vitals:   04/29/16 1339 04/29/16 1435  BP: 120/83 130/85  Pulse: 71   Resp: 16   Temp: 99.2 F (37.3 C)   TempSrc: Oral   Weight: 182 lb (82.6 kg)  Height: 5' (1.524 m)     Physical Exam  Constitutional: She is oriented to person, place, and time and well-developed, well-nourished, and in no distress. No distress.  HENT:  Head: Normocephalic and atraumatic.  Eyes: Conjunctivae and EOM are normal. Pupils are equal, round, and reactive to light. No scleral icterus.  Neck: Normal range of motion. Neck supple. Carotid bruit is not present. No thyromegaly present.  Cardiovascular: Normal rate, regular rhythm and normal heart sounds.  Exam reveals no gallop and no friction rub.   No murmur heard. Pulmonary/Chest: Effort normal and breath sounds normal. No respiratory distress. She has no wheezes. She has no rales.  Musculoskeletal: She exhibits no edema.  Lymphadenopathy:    She has no cervical  adenopathy.  Neurological: She is alert and oriented to person, place, and time.  Psychiatric: Mood, memory, affect and judgment normal.  Vitals reviewed.      No results found for this or any previous visit (from the past 2160 hour(s)).   Assessment & Plan  Problem List Items Addressed This Visit      Cardiovascular and Mediastinum   Hypertension   Relevant Medications   propranolol (INDERAL) 20 MG tablet     Digestive   GERD (gastroesophageal reflux disease)   Relevant Medications   omeprazole (PRILOSEC) 20 MG capsule    Other Visit Diagnoses    Immunization due    -  Primary   Relevant Orders   Flu Vaccine QUAD 36+ mos PF IM (Fluarix & Fluzone Quad PF)      Meds ordered this encounter  Medications  . propranolol (INDERAL) 20 MG tablet    Sig: Take 1 tablet (20 mg total) by mouth 2 (two) times daily.    Dispense:  60 tablet    Refill:  12  . omeprazole (PRILOSEC) 20 MG capsule    Sig: Take 1 capsule (20 mg total) by mouth daily.    Dispense:  30 capsule    Refill:  12   1. Essential hypertension  - propranolol (INDERAL) 20 MG tablet; Take 1 tablet (20 mg total) by mouth 2 (two) times daily.  Dispense: 60 tablet; Refill: 12  2. Gastroesophageal reflux disease without esophagitis  - omeprazole (PRILOSEC) 20 MG capsule; Take 1 capsule (20 mg total) by mouth daily.  Dispense: 30 capsule; Refill: 12  3. Immunization due  - Flu Vaccine QUAD 36+ mos PF IM (Fluarix & Fluzone Quad PF)

## 2016-10-14 ENCOUNTER — Ambulatory Visit: Payer: Medicare Other | Admitting: Family Medicine

## 2016-10-21 ENCOUNTER — Ambulatory Visit (INDEPENDENT_AMBULATORY_CARE_PROVIDER_SITE_OTHER): Payer: Medicare Other | Admitting: Family Medicine

## 2016-10-21 ENCOUNTER — Encounter: Payer: Self-pay | Admitting: Family Medicine

## 2016-10-21 VITALS — BP 123/65 | HR 58 | Temp 98.5°F | Resp 16 | Ht 60.0 in | Wt 178.0 lb

## 2016-10-21 DIAGNOSIS — F3340 Major depressive disorder, recurrent, in remission, unspecified: Secondary | ICD-10-CM

## 2016-10-21 DIAGNOSIS — K219 Gastro-esophageal reflux disease without esophagitis: Secondary | ICD-10-CM

## 2016-10-21 DIAGNOSIS — I10 Essential (primary) hypertension: Secondary | ICD-10-CM

## 2016-10-21 DIAGNOSIS — Z889 Allergy status to unspecified drugs, medicaments and biological substances status: Secondary | ICD-10-CM

## 2016-10-21 MED ORDER — FLUTICASONE PROPIONATE 50 MCG/ACT NA SUSP: 2.0000 | Freq: Every day | NASAL | 12 refills | Status: DC

## 2016-10-21 NOTE — Progress Notes (Signed)
Name: Elizabeth [name]   MRN: [id number]    DOB: Feb 11, 1956   Date:10/21/2016       Progress Note  Subjective  Chief Complaint  Chief Complaint  Patient presents with  . Hypertension  . Gastroesophageal Reflux    HPI Hedre for f/u of HBP and GERD.  Sees Psych for depression and doing ok on med combination.  No problem-specific Assessment & Plan notes found for this encounter.   Past Medical History:  Diagnosis Date  . Anxiety   . Depression   . GERD (gastroesophageal reflux disease)   . Insomnia   . Obesity     Past Surgical History:  Procedure Laterality Date  . CHOLECYSTECTOMY  2004   repeat 2014  . COLONOSCOPY  08/05/2011    Family History  Problem Relation Age of Onset  . Breast cancer Maternal Aunt 70  . Alzheimer's disease Mother   . Stroke Father   . Hypertension Father   . Multiple sclerosis Sister   . Hypertension Sister     Social History   Social History  . Marital status: Widowed    Spouse name: N/A  . Number of children: N/A  . Years of education: N/A   Occupational History  . Not on file.   Social History Main Topics  . Smoking status: Current Every Day Smoker    Packs/day: 1.50    Years: 40.00    Types: Cigarettes  . Smokeless tobacco: Never Used  . Alcohol use No  . Drug use: No  . Sexual activity: Yes    Birth control/ protection: Injection   Other Topics Concern  . Not on file   Social History Narrative  . No narrative on file     Current Outpatient Prescriptions:  .  aspirin 81 MG tablet, Take 81 mg by mouth daily., Disp: , Rfl:  .  Cholecalciferol (VITAMIN D-3) 1000 units CAPS, Take 1 capsule by mouth daily., Disp: , Rfl:  .  citalopram (CELEXA) 40 MG tablet, , Disp: , Rfl:  .  fluticasone (FLONASE) 50 MCG/ACT nasal spray, Place 2 sprays into both nostrils daily., Disp: 16 g, Rfl: 12 .  loratadine (ALLERGY RELIEF) 10 MG tablet, Take 10 mg by mouth daily., Disp: , Rfl:  .  omeprazole (PRILOSEC) 20 MG capsule, Take 1  capsule (20 mg total) by mouth daily., Disp: 30 capsule, Rfl: 12 .  propranolol (INDERAL) 20 MG tablet, Take 1 tablet (20 mg total) by mouth 2 (two) times daily., Disp: 60 tablet, Rfl: 12 .  ARIPiprazole (ABILIFY) 5 MG tablet, Take 5 mg by mouth daily., Disp: , Rfl:   Not on File   Review of Systems  Constitutional: Negative for chills, fever, malaise/fatigue and weight loss.  HENT: Negative for hearing loss and tinnitus.   Eyes: Negative for blurred vision and double vision.  Respiratory: Negative for cough, shortness of breath and wheezing.   Cardiovascular: Negative for chest pain, palpitations and leg swelling.  Gastrointestinal: Negative for abdominal pain, blood in stool and heartburn.  Genitourinary: Negative for dysuria, frequency and urgency.  Musculoskeletal: Negative for joint pain and myalgias.  Skin: Negative for rash.  Neurological: Negative for dizziness, tingling, tremors, weakness and headaches.      Objective  Vitals:   10/21/16 1531  BP: 123/65  Pulse: (!) 58  Resp: 16  Temp: 98.5 F (36.9 C)  TempSrc: Oral  Weight: 178 lb (80.7 kg)  Height: 5' (1.524 m)    Physical Exam  Constitutional: She  is oriented to person, place, and time and well-developed, well-nourished, and in no distress. No distress.  HENT:  Head: Normocephalic and atraumatic.  Eyes: Conjunctivae and EOM are normal. Pupils are equal, round, and reactive to light. No scleral icterus.  Neck: Normal range of motion. Neck supple. No thyromegaly present.  Cardiovascular: Normal rate, regular rhythm and normal heart sounds.  Exam reveals no gallop and no friction rub.   No murmur heard. Pulmonary/Chest: Effort normal and breath sounds normal. No respiratory distress. She has no wheezes. She has no rales.  Abdominal: Soft. Bowel sounds are normal. She exhibits no distension and no mass. There is no tenderness.  Musculoskeletal: She exhibits no edema.  Lymphadenopathy:    She has no cervical  adenopathy.  Neurological: She is alert and oriented to person, place, and time.  Vitals reviewed.      No results found for this or any previous visit (from the past 2160 hour(s)).   Assessment & Plan  Problem List Items Addressed This Visit      Cardiovascular and Mediastinum   Hypertension - Primary     Digestive   GERD (gastroesophageal reflux disease)     Other   Multiple allergies   Relevant Medications   fluticasone (FLONASE) 50 MCG/ACT nasal spray   Depression      Meds ordered this encounter  Medications  . ARIPiprazole (ABILIFY) 5 MG tablet    Sig: Take 5 mg by mouth daily.  . fluticasone (FLONASE) 50 MCG/ACT nasal spray    Sig: Place 2 sprays into both nostrils daily.    Dispense:  16 g    Refill:  12   1. Essential hypertension Cont Propranolol  2. Gastroesophageal reflux disease without esophagitis Cont Omeprazole.  May try to take every other day  3. Recurrent major depressive disorder, in remission (HCC) Cont to see Psych  4. Multiple allergies  - fluticasone (FLONASE) 50 MCG/ACT nasal spray; Place 2 sprays into both nostrils daily.  Dispense: 16 g; Refill: 12

## 2017-02-13 ENCOUNTER — Other Ambulatory Visit: Payer: Self-pay

## 2017-02-13 DIAGNOSIS — I10 Essential (primary) hypertension: Secondary | ICD-10-CM

## 2017-02-13 MED ORDER — PROPRANOLOL HCL 20 MG PO TABS: 20.0000 mg | ORAL_TABLET | Freq: Two times a day (BID) | ORAL | 1 refills | Status: DC

## 2017-02-17 ENCOUNTER — Ambulatory Visit (INDEPENDENT_AMBULATORY_CARE_PROVIDER_SITE_OTHER): Payer: Medicare Other

## 2017-02-17 VITALS — BP 108/70 | HR 78 | Temp 99.0°F | Resp 16 | Ht 59.0 in | Wt 173.2 lb

## 2017-02-17 DIAGNOSIS — Z0000 Encounter for general adult medical examination without abnormal findings: Secondary | ICD-10-CM | POA: Diagnosis not present

## 2017-02-17 DIAGNOSIS — Z1231 Encounter for screening mammogram for malignant neoplasm of breast: Secondary | ICD-10-CM

## 2017-02-17 NOTE — Progress Notes (Signed)
Subjective:   Elizabeth CollieLoretta Andrews [name] is a 61 y.o. female who presents for Medicare Annual (Subsequent) preventive examination.  Review of Systems:  Cardiac Risk Factors include: obesity (BMI >30kg/m2);hypertension;smoking/ tobacco exposure     Objective:     Vitals: BP 108/70 (BP Location: Left Arm, Patient Position: Sitting)   Pulse 78   Temp 99 F (37.2 C)   Resp 16   Ht 4\' 11"  (1.499 m)   Wt 173 lb 3.2 oz (78.6 kg)   BMI 34.98 kg/m   Body mass index is 34.98 kg/m.   Tobacco History  Smoking Status  . Current Every Day Smoker  . Packs/day: 0.50  . Years: 40.00  . Types: Cigarettes  Smokeless Tobacco  . Never Used     Ready to quit: No Counseling given: No   Past Medical History:  Diagnosis Date  . Anxiety   . Depression   . GERD (gastroesophageal reflux disease)   . Insomnia   . Obesity    Past Surgical History:  Procedure Laterality Date  . CHOLECYSTECTOMY  2004   repeat 2014  . COLONOSCOPY  08/05/2011   Family History  Problem Relation Age of Onset  . Breast cancer Maternal Aunt 70  . Alzheimer's disease Mother   . Stroke Father   . Hypertension Father   . Multiple sclerosis Sister   . Hypertension Sister   . Colon cancer Sister    History  Sexual Activity  . Sexual activity: Yes  . Birth control/ protection: Injection    Outpatient Encounter Prescriptions as of 02/17/2017  Medication Sig  . ARIPiprazole (ABILIFY) 5 MG tablet Take 5 mg by mouth daily.  Marland Kitchen. aspirin 81 MG tablet Take 81 mg by mouth daily.  . Cholecalciferol (VITAMIN D-3) 1000 units CAPS Take 1 capsule by mouth daily.  . citalopram (CELEXA) 40 MG tablet   . fluticasone (FLONASE) 50 MCG/ACT nasal spray Place 2 sprays into both nostrils daily.  Marland Kitchen. loratadine (ALLERGY RELIEF) 10 MG tablet Take 10 mg by mouth daily.  Marland Kitchen. omeprazole (PRILOSEC) 20 MG capsule Take 1 capsule (20 mg total) by mouth daily.  . propranolol (INDERAL) 20 MG tablet Take 1 tablet (20 mg total) by mouth 2 (two)  times daily.   No facility-administered encounter medications on file as of 02/17/2017.     Activities of Daily Living In your present state of health, do you have any difficulty performing the following activities: 02/17/2017 10/21/2016  Hearing? N N  Vision? N N  Difficulty concentrating or making decisions? N N  Walking or climbing stairs? N N  Dressing or bathing? N N  Doing errands, shopping? N N  Preparing Food and eating ? N -  Using the Toilet? N -  In the past six months, have you accidently leaked urine? N -  Do you have problems with loss of bowel control? N -  Managing your Medications? N -  Managing your Finances? N -  Housekeeping or managing your Housekeeping? N -  Some recent data might be hidden    Patient Care Team: Galen ManilaKennedy, Lauren Renee, NP as PCP - General (Nurse Practitioner) Galen ManilaKennedy, Lauren Renee, NP as Nurse Practitioner (Nurse Practitioner)    Assessment:     Exercise Activities and Dietary recommendations Current Exercise Habits: The patient does not participate in regular exercise at present, Exercise limited by: None identified  Goals    . Quit smoking / using tobacco          Smoking cessation  discussed    . Weight < 200 lb (90.719 kg)          Pt wants to loose 20 pound in one year.      Fall Risk Fall Risk  02/17/2017 10/21/2016 04/29/2016 01/22/2016 05/30/2015  Falls in the past year? No No No No No   Depression Screen PHQ 2/9 Scores 02/17/2017 10/21/2016 04/29/2016 01/22/2016  PHQ - 2 Score 0 0 0 0  PHQ- 9 Score - - - -     Cognitive Function MMSE - Mini Mental State Exam 05/30/2015  Orientation to time 5  Orientation to Place 5  Registration 3  Attention/ Calculation 5  Recall 3  Language- name 2 objects 2  Language- repeat 1  Language- follow 3 step command 3  Language- read & follow direction 1  Write a sentence 1  Copy design 1  Total score 30     6CIT Screen 02/17/2017  What Year? 0 points  What month? 0 points  What  time? 0 points  Count back from 20 0 points  Months in reverse 0 points  Repeat phrase 0 points  Total Score 0    Immunization History  Administered Date(s) Administered  . Influenza,inj,Quad PF,36+ Mos 05/30/2015, 04/29/2016  . Pneumococcal Polysaccharide-23 02/25/2013  . Tdap 02/25/2013   Screening Tests Health Maintenance  Topic Date Due  . INFLUENZA VACCINE  03/04/2017  . PAP SMEAR  06/04/2017  . MAMMOGRAM  10/08/2017  . TETANUS/TDAP  02/26/2023  . COLONOSCOPY  03/05/2023  . Hepatitis C Screening  Completed  . HIV Screening  Completed      Plan:     I have personally reviewed and addressed the Medicare Annual Wellness questionnaire and have noted the following in the patient's chart:  A. Medical and social history B. Use of alcohol, tobacco or illicit drugs  C. Current medications and supplements D. Functional ability and status E.  Nutritional status F.  Physical activity G. Advance directives H. List of other physicians I.  Hospitalizations, surgeries, and ER visits in previous 12 months J.  Vitals K. Screenings such as hearing and vision if needed, cognitive and depression L. Referrals and appointments   In addition, I have reviewed and discussed with patient certain preventive protocols, quality metrics, and best practice recommendations. A written personalized care plan for preventive services as well as general preventive health recommendations were provided to patient.   Signed,  Marin Roberts, LPN Nurse Health Advisor   MD Recommendations:none

## 2017-02-17 NOTE — Patient Instructions (Addendum)
Ms. Sacca , Thank you for taking time to come for your Medicare Wellness Visit. I appreciate your ongoing commitment to your health goals. Please review the following plan we discussed and let me know if I can assist you in the future.   Screening recommendations/referrals: Colonoscopy: completed 03/04/2013 Mammogram: order placed, call [phone] to schedule Bone Density: due at age 61 Recommended yearly ophthalmology/optometry visit for glaucoma screening and checkup Recommended yearly dental visit for hygiene and checkup  Vaccinations: Influenza vaccine: up to date, due 04/2017 Pneumococcal vaccine: up to date Tdap vaccine: up to date Shingles vaccine: due, check with your insurance company for coverage    Advanced directives: Advance directive discussed with you today. I have provided a copy for you to complete at home and have notarized. Once this is complete please bring a copy in to our office so we can scan it into your chart.  Conditions/risks identified: Smoking cessation discussed  Next appointment: Follow up on 02/24/2017 at 2:40pm with Lissa Merlin. Follow up in one year for your annual wellness exam.  Preventive Care 40-64 Years, Female Preventive care refers to lifestyle choices and visits with your health care provider that can promote health and wellness. What does preventive care include?  A yearly physical exam. This is also called an annual well check.  Dental exams once or twice a year.  Routine eye exams. Ask your health care provider how often you should have your eyes checked.  Personal lifestyle choices, including:  Daily care of your teeth and gums.  Regular physical activity.  Eating a healthy diet.  Avoiding tobacco and drug use.  Limiting alcohol use.  Practicing safe sex.  Taking low-dose aspirin daily starting at age 24.  Taking vitamin and mineral supplements as recommended by your health care provider. What happens during an annual  well check? The services and screenings done by your health care provider during your annual well check will depend on your age, overall health, lifestyle risk factors, and family history of disease. Counseling  Your health care provider may ask you questions about your:  Alcohol use.  Tobacco use.  Drug use.  Emotional well-being.  Home and relationship well-being.  Sexual activity.  Eating habits.  Work and work Statistician.  Method of birth control.  Menstrual cycle.  Pregnancy history. Screening  You may have the following tests or measurements:  Height, weight, and BMI.  Blood pressure.  Lipid and cholesterol levels. These may be checked every 5 years, or more frequently if you are over 49 years old.  Skin check.  Lung cancer screening. You may have this screening every year starting at age 71 if you have a 30-pack-year history of smoking and currently smoke or have quit within the past 15 years.  Fecal occult blood test (FOBT) of the stool. You may have this test every year starting at age 71.  Flexible sigmoidoscopy or colonoscopy. You may have a sigmoidoscopy every 5 years or a colonoscopy every 10 years starting at age 29.  Hepatitis C blood test.  Hepatitis B blood test.  Sexually transmitted disease (STD) testing.  Diabetes screening. This is done by checking your blood sugar (glucose) after you have not eaten for a while (fasting). You may have this done every 1-3 years.  Mammogram. This may be done every 1-2 years. Talk to your health care provider about when you should start having regular mammograms. This may depend on whether you have a family history of breast cancer.  BRCA-related  cancer screening. This may be done if you have a family history of breast, ovarian, tubal, or peritoneal cancers.  Pelvic exam and Pap test. This may be done every 3 years starting at age 35. Starting at age 20, this may be done every 5 years if you have a Pap test in  combination with an HPV test.  Bone density scan. This is done to screen for osteoporosis. You may have this scan if you are at high risk for osteoporosis. Discuss your test results, treatment options, and if necessary, the need for more tests with your health care provider. Vaccines  Your health care provider may recommend certain vaccines, such as:  Influenza vaccine. This is recommended every year.  Tetanus, diphtheria, and acellular pertussis (Tdap, Td) vaccine. You may need a Td booster every 10 years.  Zoster vaccine. You may need this after age 61.  Pneumococcal 13-valent conjugate (PCV13) vaccine. You may need this if you have certain conditions and were not previously vaccinated.  Pneumococcal polysaccharide (PPSV23) vaccine. You may need one or two doses if you smoke cigarettes or if you have certain conditions. Talk to your health care provider about which screenings and vaccines you need and how often you need them. This information is not intended to replace advice given to you by your health care provider. Make sure you discuss any questions you have with your health care provider. Document Released: 08/17/2015 Document Revised: 04/09/2016 Document Reviewed: 05/22/2015 Elsevier Interactive Patient Education  2017 [place] Prevention in the Home Falls can cause injuries. They can happen to people of all ages. There are many things you can do to make your home safe and to help prevent falls. What can I do on the outside of my home?  Regularly fix the edges of walkways and driveways and fix any cracks.  Remove anything that might make you trip as you walk through a door, such as a raised step or threshold.  Trim any bushes or trees on the path to your home.  Use bright outdoor lighting.  Clear any walking paths of anything that might make someone trip, such as rocks or tools.  Regularly check to see if handrails are loose or broken. Make sure that both  sides of any steps have handrails.  Any raised decks and porches should have guardrails on the edges.  Have any leaves, snow, or ice cleared regularly.  Use sand or salt on walking paths during winter.  Clean up any spills in your garage right away. This includes oil or grease spills. What can I do in the bathroom?  Use night lights.  Install grab bars by the toilet and in the tub and shower. Do not use towel bars as grab bars.  Use non-skid mats or decals in the tub or shower.  If you need to sit down in the shower, use a plastic, non-slip stool.  Keep the floor dry. Clean up any water that spills on the floor as soon as it happens.  Remove soap buildup in the tub or shower regularly.  Attach bath mats securely with double-sided non-slip rug tape.  Do not have throw rugs and other things on the floor that can make you trip. What can I do in the bedroom?  Use night lights.  Make sure that you have a light by your bed that is easy to reach.  Do not use any sheets or blankets that are too big for your bed. They should  not hang down onto the floor.  Have a firm chair that has side arms. You can use this for support while you get dressed.  Do not have throw rugs and other things on the floor that can make you trip. What can I do in the kitchen?  Clean up any spills right away.  Avoid walking on wet floors.  Keep items that you use a lot in easy-to-reach places.  If you need to reach something above you, use a strong step stool that has a grab bar.  Keep electrical cords out of the way.  Do not use floor polish or wax that makes floors slippery. If you must use wax, use non-skid floor wax.  Do not have throw rugs and other things on the floor that can make you trip. What can I do with my stairs?  Do not leave any items on the stairs.  Make sure that there are handrails on both sides of the stairs and use them. Fix handrails that are broken or loose. Make sure that  handrails are as long as the stairways.  Check any carpeting to make sure that it is firmly attached to the stairs. Fix any carpet that is loose or worn.  Avoid having throw rugs at the top or bottom of the stairs. If you do have throw rugs, attach them to the floor with carpet tape.  Make sure that you have a light switch at the top of the stairs and the bottom of the stairs. If you do not have them, ask someone to add them for you. What else can I do to help prevent falls?  Wear shoes that:  Do not have high heels.  Have rubber bottoms.  Are comfortable and fit you well.  Are closed at the toe. Do not wear sandals.  If you use a stepladder:  Make sure that it is fully opened. Do not climb a closed stepladder.  Make sure that both sides of the stepladder are locked into place.  Ask someone to hold it for you, if possible.  Clearly mark and make sure that you can see:  Any grab bars or handrails.  First and last steps.  Where the edge of each step is.  Use tools that help you move around (mobility aids) if they are needed. These include:  Canes.  Walkers.  Scooters.  Crutches.  Turn on the lights when you go into a dark area. Replace any light bulbs as soon as they burn out.  Set up your furniture so you have a clear path. Avoid moving your furniture around.  If any of your floors are uneven, fix them.  If there are any pets around you, be aware of where they are.  Review your medicines with your doctor. Some medicines can make you feel dizzy. This can increase your chance of falling. Ask your doctor what other things that you can do to help prevent falls. This information is not intended to replace advice given to you by your health care provider. Make sure you discuss any questions you have with your health care provider. Document Released: 05/17/2009 Document Revised: 12/27/2015 Document Reviewed: 08/25/2014 Elsevier Interactive Patient Education  2017  [employer].   Steps to Quit Smoking Smoking tobacco can be bad for your health. It can also affect almost every organ in your body. Smoking puts you and people around you at risk for many serious long-lasting (chronic) diseases. Quitting smoking is hard, but it is one of  the best things that you can do for your health. It is never too late to quit. What are the benefits of quitting smoking? When you quit smoking, you lower your risk for getting serious diseases and conditions. They can include:  Lung cancer or lung disease.  Heart disease.  Stroke.  Heart attack.  Not being able to have children (infertility).  Weak bones (osteoporosis) and broken bones (fractures).  If you have coughing, wheezing, and shortness of breath, those symptoms may get better when you quit. You may also get sick less often. If you are pregnant, quitting smoking can help to lower your chances of having a baby of low birth weight. What can I do to help me quit smoking? Talk with your doctor about what can help you quit smoking. Some things you can do (strategies) include:  Quitting smoking totally, instead of slowly cutting back how much you smoke over a period of time.  Going to in-person counseling. You are more likely to quit if you go to many counseling sessions.  Using resources and support systems, such as: ? Database administrator with a Social worker. ? Phone quitlines. ? Careers information officer. ? Support groups or group counseling. ? Text messaging programs. ? Mobile phone apps or applications.  Taking medicines. Some of these medicines may have nicotine in them. If you are pregnant or breastfeeding, do not take any medicines to quit smoking unless your doctor says it is okay. Talk with your doctor about counseling or other things that can help you.  Talk with your doctor about using more than one strategy at the same time, such as taking medicines while you are also going to in-person counseling. This  can help make quitting easier. What things can I do to make it easier to quit? Quitting smoking might feel very hard at first, but there is a lot that you can do to make it easier. Take these steps:  Talk to your family and friends. Ask them to support and encourage you.  Call phone quitlines, reach out to support groups, or work with a Social worker.  Ask people who smoke to not smoke around you.  Avoid places that make you want (trigger) to smoke, such as: ? Bars. ? Parties. ? Smoke-break areas at work.  Spend time with people who do not smoke.  Lower the stress in your life. Stress can make you want to smoke. Try these things to help your stress: ? Getting regular exercise. ? Deep-breathing exercises. ? Yoga. ? Meditating. ? Doing a body scan. To do this, close your eyes, focus on one area of your body at a time from head to toe, and notice which parts of your body are tense. Try to relax the muscles in those areas.  Download or buy apps on your mobile phone or tablet that can help you stick to your quit plan. There are many free apps, such as QuitGuide from the [employer] Office manager for Disease Control and Prevention). You can find more support from smokefree.gov and other websites.  This information is not intended to replace advice given to you by your health care provider. Make sure you discuss any questions you have with your health care provider. Document Released: 05/17/2009 Document Revised: 03/18/2016 Document Reviewed: 12/05/2014 Elsevier Interactive Patient Education  2018 [employer].

## 2017-02-24 ENCOUNTER — Ambulatory Visit: Payer: Medicare Other | Admitting: Nurse Practitioner

## 2017-03-03 ENCOUNTER — Ambulatory Visit (INDEPENDENT_AMBULATORY_CARE_PROVIDER_SITE_OTHER): Payer: Medicare Other | Admitting: Nurse Practitioner

## 2017-03-03 ENCOUNTER — Encounter: Payer: Self-pay | Admitting: Nurse Practitioner

## 2017-03-03 VITALS — BP 124/73 | HR 64 | Temp 98.9°F | Ht 59.0 in | Wt 174.0 lb

## 2017-03-03 DIAGNOSIS — Z79899 Other long term (current) drug therapy: Secondary | ICD-10-CM

## 2017-03-03 DIAGNOSIS — I10 Essential (primary) hypertension: Secondary | ICD-10-CM

## 2017-03-03 DIAGNOSIS — E559 Vitamin D deficiency, unspecified: Secondary | ICD-10-CM

## 2017-03-03 DIAGNOSIS — K219 Gastro-esophageal reflux disease without esophagitis: Secondary | ICD-10-CM

## 2017-03-03 DIAGNOSIS — Z780 Asymptomatic menopausal state: Secondary | ICD-10-CM

## 2017-03-03 MED ORDER — PROPRANOLOL HCL 20 MG PO TABS: 20.0000 mg | ORAL_TABLET | Freq: Two times a day (BID) | ORAL | 1 refills | Status: DC

## 2017-03-03 MED ORDER — SM WRIST CUFF BP MONITOR MISC: 1.0000 [IU] | Freq: Every day | 0 refills | Status: AC

## 2017-03-03 MED ORDER — OMEPRAZOLE 20 MG PO CPDR: 20.0000 mg | DELAYED_RELEASE_CAPSULE | Freq: Every day | ORAL | 12 refills | Status: DC

## 2017-03-03 NOTE — Assessment & Plan Note (Signed)
Stable.  Pt taking omeprazole 20 mg once daily without breakthrough symptoms.  Plan: 1. Continue omeprazole 20 mg once daily.  Reviewed side effects of short and long-term use. 2. Pt will need screening for osteoporosis. 3. Follow up 6 months.

## 2017-03-03 NOTE — Progress Notes (Signed)
Subjective:    Patient ID: Elizabeth CollieLoretta Andrews [name], female    DOB: 02/05/1956, 61 y.o.   MRN: [id number]  Elizabeth [name] is a 61 y.o. female presenting on 03/03/2017 for Hypertension   HPI Hypertension She is not checking BP at home.    150/101 - Current medications: propranolol 40 mg once daily, tolerating well without side effects - Pt denies headache, lightheadedness, dizziness, changes in vision, chest tightness/pressure, palpitations, leg swelling, sudden loss of speech or loss of consciousness. - Smoking 10-12 cigarettes per day.  Stable over time. Not ready to quit (worried about gaining weight). - She  reports no regular exercise routine. But is joining a gym. - her diet is moderate in salt, moderate in fat, and moderate in carbohydrates.   GERD No symptoms of heartburn w/ omeprazole 20 mg once daily.  Has tried stopping in past without success. Pt has not had screening for osteoporosis to date.   Mental Health Dr. Milus [facility]isterLatice - [facility].  Pt reports feeling well managed.   Social History  Substance Use Topics  . Smoking status: Current Every Day Smoker    Packs/day: 0.50    Years: 40.00    Types: Cigarettes  . Smokeless tobacco: Never Used  . Alcohol use No     Comment: once a month     Review of Systems Per HPI unless specifically indicated above      Objective:    BP 124/73 (BP Location: Right Arm, Patient Position: Sitting, Cuff Size: Normal)   Pulse 64   Temp 98.9 F (37.2 C) (Oral)   Ht 4\' 11"  (1.499 m)   Wt 174 lb (78.9 kg)   BMI 35.14 kg/m   Wt Readings from Last 3 Encounters:  03/03/17 174 lb (78.9 kg)  02/17/17 173 lb 3.2 oz (78.6 kg)  10/21/16 178 lb (80.7 kg)    Physical Exam  General - overweight, well-appearing, NAD HEENT - Normocephalic, atraumatic, PERRL, EOMI, patent nares w/o congestion, oropharynx clear, MMM Neck - supple, non-tender, no LAD, no thyromegaly Heart - RRR, no murmurs heard Lungs - Clear throughout  all lobes, no wheezing, crackles, or rhonchi. Normal work of breathing. Abdomen - soft, NTND, no masses, no hepatosplenomegaly, active bowel sounds Extremeties - non-tender, no edema, cap refill < 2 seconds, peripheral pulses intact +2 bilaterally Skin - warm, dry, no rashes Neuro - awake, alert, oriented x3, normal gait Psych - Normal mood and affect, normal behavior    Results for orders placed or performed in visit on 10/02/15  Comprehensive metabolic panel  Result Value Ref Range   Glucose 102 (H) 65 - 99 mg/dL   BUN 11 8 - 27 mg/dL   Creatinine, Ser 1.471.24 (H) 0.57 - 1.00 mg/dL   GFR calc non Af Amer 47 (L) >59 mL/min/1.73   GFR calc Af Amer 55 (L) >59 mL/min/1.73   BUN/Creatinine Ratio 9 (L) 12 - 28   Sodium 139 134 - 144 mmol/L   Potassium 4.2 3.5 - 5.2 mmol/L   Chloride 102 96 - 106 mmol/L   CO2 21 18 - 29 mmol/L   Calcium 9.4 8.7 - 10.3 mg/dL   Total Protein 6.8 6.0 - 8.5 g/dL   Albumin 4.1 3.6 - 4.8 g/dL   Globulin, Total 2.7 1.5 - 4.5 g/dL   Albumin/Globulin Ratio 1.5 1.2 - 2.2   Bilirubin Total 0.3 0.0 - 1.2 mg/dL   Alkaline Phosphatase 78 39 - 117 IU/L   AST 13 0 - 40  IU/L   ALT 13 0 - 32 IU/L      Assessment & Plan:   Problem List Items Addressed This Visit      Cardiovascular and Mediastinum   Hypertension    Controlled hypertension.  BP at goal.  Pt working on lifestyle modifications for low salt diet and is preparing to exercise at gym 3 days per week.  Taking medications tolerating well without side effects.   Plan: 1. Continue taking propranolol 40 mg once daily.  Pt likely needs extended release or 40 mg twice daily.  Reviewed w/ pt.  Pt to call in 1-2 weeks if BP readings at home are different daytime and nighttime. 2. Obtain labs today: lipid, cmp, a1c  3. Encouraged heart health diet and increasing exercise. 4. Check BP 1-2 x per week at home, keep log, and bring to clinic at next appointment. 5. Follow up 6 months.  Brief follow up 2 months at annual  physical.       Relevant Medications   propranolol (INDERAL) 20 MG tablet   Other Relevant Orders   Lipid panel   Comprehensive metabolic panel   Hemoglobin A1c     Digestive   GERD (gastroesophageal reflux disease) - Primary    Stable.  Pt taking omeprazole 20 mg once daily without breakthrough symptoms.  Plan: 1. Continue omeprazole 20 mg once daily.  Reviewed side effects of short and long-term use. 2. Pt will need screening for osteoporosis. 3. Follow up 6 months.      Relevant Medications   omeprazole (PRILOSEC) 20 MG capsule   Other Relevant Orders   CBC with Differential/Platelet    Other Visit Diagnoses    Encounter for long-term current use of high risk medication       Relevant Orders   Lipid panel   CBC with Differential/Platelet   Comprehensive metabolic panel   Hemoglobin A1c   Asymptomatic postmenopausal estrogen deficiency     Pt w/o prior DEXA scan.  Proceed w/ screening as pt is now postmenopausal.  Plan: 1. Pt to call to schedule when she calls for previously ordered mammogram. 2. Proceed w/ DEXA screen. 3. Follow up after results received.   Relevant Orders   DG Bone Density   Vitamin D deficiency     Pt previously deficient in vitamin D.  No recent check.  Pt previously took 1,000 IU daily vitamin D3.  Plan: 1. Recheck Vitamin D level 2. Followup at annual physical.   Relevant Orders   Vitamin D (25 hydroxy)      Meds ordered this encounter  Medications  . DISCONTD: omeprazole (PRILOSEC) 20 MG capsule    Sig: Take 1 capsule (20 mg total) by mouth daily.    Dispense:  30 capsule    Refill:  12    Order Specific Question:   Supervising Provider    Answer:   Smitty Cords [[id number]]  . Blood Pressure Monitoring (SM WRIST CUFF BP MONITOR) MISC    Sig: 1 Units by Does not apply route daily.    Dispense:  1 each    Refill:  0    Order Specific Question:   Supervising Provider    Answer:   Smitty Cords [[id number]]  .  propranolol (INDERAL) 20 MG tablet    Sig: Take 1 tablet (20 mg total) by mouth 2 (two) times daily.    Dispense:  60 tablet    Refill:  1    Order Specific Question:  Supervising Provider    Answer:   Smitty CordsKARAMALEGOS, ALEXANDER J [[id number]]  . omeprazole (PRILOSEC) 20 MG capsule    Sig: Take 1 capsule (20 mg total) by mouth daily.    Dispense:  30 capsule    Refill:  12      Follow up plan: Return in about 2 months (around 05/03/2017) for annual physical, 6 months for blood pressure.   Wilhelmina McardleLauren Harshith Pursell, DNP, AGPCNP-BC Adult Gerontology Primary Care Nurse Practitioner [facility] 03/03/2017, 3:32 PM

## 2017-03-03 NOTE — Progress Notes (Signed)
I have reviewed this encounter including the documentation in this note and/or discussed this patient with the provider, Wilhelmina McardleLauren Kennedy, AGPCNP-BC. I am certifying that I agree with the content of this note as supervising physician.  Saralyn PilarAlexander Havanah Nelms, DO [facility] 03/03/2017, 5:31 PM

## 2017-03-03 NOTE — Patient Instructions (Addendum)
Margaretha GlassingLoretta, Thank you for coming in to clinic today.  1. For your hypertension: - Keep taking propranolol 40 mg once daily. - START checking blood pressures  Let me know if your bp is higher in evening or before taking medicines. - You will be due for FASTING BLOOD WORK (no food or drink after midnight before, only water or coffee without cream/sugar on the morning of)  - Please go ahead and schedule a "Lab Only" visit in the morning at the clinic for lab draw in the next 7 days  For Lab Results, once available within 2-3 days of blood draw, you can can log in to MyChart online to view your results and a brief explanation. Also, we can discuss results at next follow-up visit.   2. For GERD: - Continue your omeprazole 20 mg once daily.   Please schedule a follow-up appointment with Elizabeth [name], AGNP. Return in about 2 months (around 05/03/2017) for annual physical, 6 months for blood pressure.  If you have any other questions or concerns, please feel free to call the clinic or send a message through MyChart. You may also schedule an earlier appointment if necessary.  You will receive a survey after today's visit either digitally by e-mail or paper by [employer] mail. Your experiences and feedback matter to us.  Please respond so we know how we are doing as we provide care for you.   Elizabeth McardleLauren Montez Cuda, DNP, AGNP-BC Adult Gerontology Nurse Practitioner [facility], [facility]

## 2017-03-03 NOTE — Assessment & Plan Note (Addendum)
Controlled hypertension.  BP at goal.  Pt working on lifestyle modifications for low salt diet and is preparing to exercise at gym 3 days per week.  Taking medications tolerating well without side effects.   Plan: 1. Continue taking propranolol 40 mg once daily.  Pt likely needs extended release or 40 mg twice daily.  Reviewed w/ pt.  Pt to call in 1-2 weeks if BP readings at home are different daytime and nighttime. 2. Obtain labs today: lipid, cmp, a1c  3. Encouraged heart health diet and increasing exercise. 4. Check BP 1-2 x per week at home, keep log, and bring to clinic at next appointment. 5. Follow up 6 months.  Brief follow up 2 months at annual physical.

## 2017-03-05 ENCOUNTER — Other Ambulatory Visit: Payer: Medicare Other

## 2017-03-05 LAB — LIPID PANEL
Cholesterol: 202 mg/dL — ABNORMAL HIGH (ref <200)
HDL: 47 mg/dL — ABNORMAL LOW (ref >50)
LDL Cholesterol: 128 mg/dL — ABNORMAL HIGH (ref <100)
Total CHOL/HDL Ratio: 4.3 Ratio (ref <5.0)
Triglycerides: 137 mg/dL (ref <150)
VLDL: 27 mg/dL (ref <30)

## 2017-03-05 LAB — CBC WITH DIFFERENTIAL/PLATELET
Basophils Absolute: 77 cells/uL (ref 0–200)
Basophils Relative: 1 %
Eosinophils Absolute: 231 cells/uL (ref 15–500)
Eosinophils Relative: 3 %
HCT: 41.4 % (ref 35.0–45.0)
Hemoglobin: 13.9 g/dL (ref 11.7–15.5)
Lymphocytes Relative: 27 %
Lymphs Abs: 2079 cells/uL (ref 850–3900)
MCH: 31.6 pg (ref 27.0–33.0)
MCHC: 33.6 g/dL (ref 32.0–36.0)
MCV: 94.1 fL (ref 80.0–100.0)
MPV: 9.8 fL (ref 7.5–12.5)
Monocytes Absolute: 385 cells/uL (ref 200–950)
Monocytes Relative: 5 %
Neutro Abs: 4928 cells/uL (ref 1500–7800)
Neutrophils Relative %: 64 %
Platelets: 344 10*3/uL (ref 140–400)
RBC: 4.4 MIL/uL (ref 3.80–5.10)
RDW: 13.4 % (ref 11.0–15.0)
WBC: 7.7 10*3/uL (ref 3.8–10.8)

## 2017-03-05 LAB — COMPREHENSIVE METABOLIC PANEL
ALT: 8 U/L (ref 6–29)
AST: 12 U/L (ref 10–35)
Albumin: 3.8 g/dL (ref 3.6–5.1)
Alkaline Phosphatase: 86 U/L (ref 33–130)
BUN: 8 mg/dL (ref 7–25)
CO2: 24 mmol/L (ref 20–31)
Calcium: 9.5 mg/dL (ref 8.6–10.4)
Chloride: 108 mmol/L (ref 98–110)
Creat: 1.17 mg/dL — ABNORMAL HIGH (ref 0.50–0.99)
Glucose, Bld: 83 mg/dL (ref 65–99)
Potassium: 4.4 mmol/L (ref 3.5–5.3)
Sodium: 139 mmol/L (ref 135–146)
Total Bilirubin: 0.5 mg/dL (ref 0.2–1.2)
Total Protein: 6.4 g/dL (ref 6.1–8.1)

## 2017-03-06 ENCOUNTER — Encounter: Payer: Self-pay | Admitting: Nurse Practitioner

## 2017-03-06 LAB — VITAMIN D 25 HYDROXY (VIT D DEFICIENCY, FRACTURES): Vit D, 25-Hydroxy: 35 ng/mL (ref 30–100)

## 2017-03-06 LAB — HEMOGLOBIN A1C
Hgb A1c MFr Bld: 5.2 % (ref <5.7)
Mean Plasma Glucose: 103 mg/dL

## 2017-04-06 ENCOUNTER — Encounter: Payer: Self-pay | Admitting: Nurse Practitioner

## 2017-04-07 ENCOUNTER — Ambulatory Visit: Payer: Medicare Other | Admitting: Nurse Practitioner

## 2017-04-07 ENCOUNTER — Telehealth: Payer: Self-pay

## 2017-04-07 NOTE — Telephone Encounter (Signed)
Pt advised as per Leotis ShamesLauren and appt scheduled.

## 2017-04-08 ENCOUNTER — Ambulatory Visit: Payer: Medicare Other | Admitting: Nurse Practitioner

## 2017-04-09 ENCOUNTER — Encounter: Payer: Self-pay | Admitting: Nurse Practitioner

## 2017-04-09 ENCOUNTER — Ambulatory Visit (INDEPENDENT_AMBULATORY_CARE_PROVIDER_SITE_OTHER): Payer: Medicare Other | Admitting: Nurse Practitioner

## 2017-04-09 VITALS — BP 153/71 | HR 57 | Temp 98.4°F | Ht 59.0 in | Wt 176.2 lb

## 2017-04-09 DIAGNOSIS — R197 Diarrhea, unspecified: Secondary | ICD-10-CM | POA: Diagnosis not present

## 2017-04-09 DIAGNOSIS — R112 Nausea with vomiting, unspecified: Secondary | ICD-10-CM

## 2017-04-09 DIAGNOSIS — R1030 Lower abdominal pain, unspecified: Secondary | ICD-10-CM | POA: Diagnosis not present

## 2017-04-09 MED ORDER — ONDANSETRON 4 MG PO TBDP: 4.0000 mg | ORAL_TABLET | Freq: Three times a day (TID) | ORAL | 0 refills | Status: DC | PRN

## 2017-04-09 MED ORDER — METRONIDAZOLE 500 MG PO TABS: 500.0000 mg | ORAL_TABLET | Freq: Three times a day (TID) | ORAL | 0 refills | Status: AC

## 2017-04-09 MED ORDER — CIPROFLOXACIN HCL 500 MG PO TABS: 500.0000 mg | ORAL_TABLET | Freq: Two times a day (BID) | ORAL | 0 refills | Status: DC

## 2017-04-09 NOTE — Patient Instructions (Addendum)
Margaretha GlassingLoretta, Thank you for coming in to clinic today.  1. CT scan will be tomorrow at med [facility] [place].  - GET labs done at [facility] today.  2. FOR antibiotics: START metronidazole 500 mg three times daily x 10 days and ciprofloxacin 500 mg two times daily x 10 days. - Take a probiotic. OTC pill or yogurt. - Continue to drink lots of fluids.  Please schedule a follow-up appointment with Wilhelmina McardleLauren Trinita Devlin, AGNP. Return 3-5 days if symptoms worsen or fail to improve.   If you have any other questions or concerns, please feel free to call the clinic or send a message through MyChart. You may also schedule an earlier appointment if necessary.  You will receive a survey after today's visit either digitally by e-mail or paper by [employer] mail. Your experiences and feedback matter to us.  Please respond so we know how we are doing as we provide care for you.   Wilhelmina McardleLauren Wyett Narine, DNP, AGNP-BC Adult Gerontology Nurse Practitioner [facility], [facility]

## 2017-04-09 NOTE — Progress Notes (Signed)
Subjective:    Patient ID: Elizabeth [name], female    DOB: 1956/04/22, 61 y.o.   MRN: [id number]  Elizabeth [name] is a 61 y.o. female presenting on 04/09/2017 for Diarrhea (nauseated x 2 weeks, pt feels like it could be her Diverticulitis)   HPI  Nausea/Diarrhea x 2 weeks - Nausea worst in am. Takes dramamine to help w/ nausea. Has had 2-3 episodes vomiting in last 2 weeks w/ clear light green emesis. - Diarrhea all night last night. Initially water x 2-3 days.  Then started (eating better/ bland foods).  Usually helps and hasn't helped this time.  - Has had pain across lower abdomen.  Hurting on right side x 2-3 days then quit hurting w/ persistent lower abdominal pain.  Occasional cramping.  Pain does not change related to eating.  - Now bowel movements small, mucousy, soft.  Denies black stool, bloody stool.  No BM immediately after eating. - is drinking lots of fluids.  Has cut back caffeine since illness.  Now sprite and water.  About 1 liter per day of Sprite (reg).  Has routine abdominal complaints but usually resolves quickly (in 1-3 days). - Avoids fried foods. - Hx cholecystectomy.  - No antibiotics in last 2 years or more.  Social History  Substance Use Topics  . Smoking status: Current Every Day Smoker    Packs/day: 0.50    Years: 40.00    Types: Cigarettes  . Smokeless tobacco: Never Used  . Alcohol use No     Comment: once a month     Review of Systems Per HPI unless specifically indicated above     Objective:    There were no vitals taken for this visit.  Wt Readings from Last 3 Encounters:  03/03/17 174 lb (78.9 kg)  02/17/17 173 lb 3.2 oz (78.6 kg)  10/21/16 178 lb (80.7 kg)    Physical Exam General - obese, ill-appearing, NAD HEENT - Normocephalic, atraumatic, PERRL, EOMI, patent nares w/o congestion, oropharynx clear, MMM Neck - supple, non-tender, no LAD Heart - RRR, no murmurs heard Lungs - Clear throughout all lobes, no wheezing,  crackles, or rhonchi. Normal work of breathing. Abdomen - soft, generalized tenderness throughout > lower abdomen (LLQ, RLQ), nondistended, no masses, no hepatosplenomegaly, active bowel sounds Extremeties - non-tender, no edema, cap refill < 2 seconds, peripheral pulses intact +2 bilaterally Skin - warm, dry, no rashes Neuro - awake, alert, oriented x3, intact muscle strength 5/5 bilaterally, intact distal sensation to light touch, normal coordination, normal gait Psych - Normal mood and affect, normal behavior    Results for orders placed or performed in visit on 03/03/17  Lipid panel  Result Value Ref Range   Cholesterol 202 (H) <200 mg/dL   Triglycerides 137 <150 mg/dL   HDL 47 (L) >50 mg/dL   Total CHOL/HDL Ratio 4.3 <5.0 Ratio   VLDL 27 <30 mg/dL   LDL Cholesterol 128 (H) <100 mg/dL  CBC with Differential/Platelet  Result Value Ref Range   WBC 7.7 3.8 - 10.8 K/uL   RBC 4.40 3.80 - 5.10 MIL/uL   Hemoglobin 13.9 11.7 - 15.5 g/dL   HCT 41.4 35.0 - 45.0 %   MCV 94.1 80.0 - 100.0 fL   MCH 31.6 27.0 - 33.0 pg   MCHC 33.6 32.0 - 36.0 g/dL   RDW 13.4 11.0 - 15.0 %   Platelets 344 140 - 400 K/uL   MPV 9.8 7.5 - 12.5 fL   Neutro Abs 4,928  1,500 - 7,800 cells/uL   Lymphs Abs 2,079 850 - 3,900 cells/uL   Monocytes Absolute 385 200 - 950 cells/uL   Eosinophils Absolute 231 15 - 500 cells/uL   Basophils Absolute 77 0 - 200 cells/uL   Neutrophils Relative % 64 %   Lymphocytes Relative 27 %   Monocytes Relative 5 %   Eosinophils Relative 3 %   Basophils Relative 1 %   Smear Review Criteria for review not met   Comprehensive metabolic panel  Result Value Ref Range   Sodium 139 135 - 146 mmol/L   Potassium 4.4 3.5 - 5.3 mmol/L   Chloride 108 98 - 110 mmol/L   CO2 24 20 - 31 mmol/L   Glucose, Bld 83 65 - 99 mg/dL   BUN 8 7 - 25 mg/dL   Creat 1.17 (H) 0.50 - 0.99 mg/dL   Total Bilirubin 0.5 0.2 - 1.2 mg/dL   Alkaline Phosphatase 86 33 - 130 U/L   AST 12 10 - 35 U/L   ALT 8 6 -  29 U/L   Total Protein 6.4 6.1 - 8.1 g/dL   Albumin 3.8 3.6 - 5.1 g/dL   Calcium 9.5 8.6 - 10.4 mg/dL  Hemoglobin A1c  Result Value Ref Range   Hgb A1c MFr Bld 5.2 <5.7 %   Mean Plasma Glucose 103 mg/dL  VITAMIN D 25 Hydroxy (Vit-D Deficiency, Fractures)  Result Value Ref Range   Vit D, 25-Hydroxy 35 30 - 100 ng/mL      Assessment & Plan:   Problem List Items Addressed This Visit    None    Visit Diagnoses    Diarrhea of presumed infectious origin    -  Primary Acute illness w/ nausea diarrhea x 2 weeks.  Is likely bacterial gastroenteritis vs diverticulitis.  Pt w/o acute abdominal pain w/ palpation.  No palpable masses.  Pt passing solid stool now so is unlikely ileus.  Pt w/ hx of cholecystectomy so excludes this as cause of symptoms. Currently appears well hydrated.  Plan: 1. Continue adequate fluids.  Encourage  2. START metronidazole 500 mg tid x 10 days.  START ciprofloxacin 500 mg bid x 10 days. 3. Take probiotic or eat yogurt w/ antibiotic. 4. Follow up 3-5 days prn if symptoms persist or do not improve.   Relevant Medications   metroNIDAZOLE (FLAGYL) 500 MG tablet   ciprofloxacin (CIPRO) 500 MG tablet   ondansetron (ZOFRAN-ODT) 4 MG disintegrating tablet   Lower abdominal pain     Non-specific abdominal pain.  Pt w/ history of diverticulosis and diverticulitis.  See AP for diarrhea above.    Plan: 1. CBC, CMP today at [facility]. 2. CT scan evaluate diverticulosis tomorrow at [facility]. 3. Follow up if no improvement on antibiotics.  Reviewed return criteria and emergency situations of change in status (sudden sharp pain, sudden relief of pain, fever, chills, dehydration).   Relevant Medications   metroNIDAZOLE (FLAGYL) 500 MG tablet   ciprofloxacin (CIPRO) 500 MG tablet      Other Relevant Orders   CT ABDOMEN PELVIS WO CONTRAST   CBC with Differential/Platelet (Completed)   Comprehensive metabolic panel (Completed)   Non-intractable vomiting with nausea,  unspecified vomiting type     Acute, transient nausea w/ occasional vomiting.  Pt using dramamine for relief.   Plan: 1. Start ondansetron 4 mg tid prn nausea. 2. Encourage fluids.  If unable to keep adequate hydration, seek care in ED. 3. Follow up 3-5 days prn.   Relevant  Medications   ondansetron (ZOFRAN-ODT) 4 MG disintegrating tablet      Meds ordered this encounter  Medications  . Omega-3 Fatty Acids (FISH OIL) 500 MG CAPS    Sig: Take 1 capsule by mouth 2 (two) times daily.  Marland Kitchen dimenhyDRINATE (MOTION SICKNESS) 50 MG tablet    Sig: Take 50 mg by mouth every 8 (eight) hours as needed.  Marland Kitchen DISCONTD: ondansetron (ZOFRAN-ODT) 4 MG disintegrating tablet    Sig: Take 1 tablet (4 mg total) by mouth every 8 (eight) hours as needed for nausea or vomiting.    Dispense:  20 tablet    Refill:  0  . metroNIDAZOLE (FLAGYL) 500 MG tablet    Sig: Take 1 tablet (500 mg total) by mouth 3 (three) times daily.    Dispense:  30 tablet    Refill:  0  . ciprofloxacin (CIPRO) 500 MG tablet    Sig: Take 1 tablet (500 mg total) by mouth 2 (two) times daily.    Dispense:  20 tablet    Refill:  0  . ondansetron (ZOFRAN-ODT) 4 MG disintegrating tablet    Sig: Take 1 tablet (4 mg total) by mouth every 8 (eight) hours as needed for nausea or vomiting.    Dispense:  20 tablet    Refill:  0    Follow up plan: Return 3-5 days if symptoms worsen or fail to improve.   Cassell Smiles, DNP, AGPCNP-BC Adult Gerontology Primary Care Nurse Practitioner [facility] 04/09/2017, 1:30 PM

## 2017-04-10 LAB — COMPREHENSIVE METABOLIC PANEL
ALT: 11 IU/L (ref 0–32)
AST: 15 IU/L (ref 0–40)
Albumin/Globulin Ratio: 1.4 (ref 1.2–2.2)
Albumin: 3.4 g/dL — ABNORMAL LOW (ref 3.6–4.8)
Alkaline Phosphatase: 83 IU/L (ref 39–117)
BUN/Creatinine Ratio: 5 — ABNORMAL LOW (ref 12–28)
BUN: 6 mg/dL — ABNORMAL LOW (ref 8–27)
Bilirubin Total: 0.5 mg/dL (ref 0.0–1.2)
CO2: 23 mmol/L (ref 20–29)
Calcium: 8.6 mg/dL — ABNORMAL LOW (ref 8.7–10.3)
Chloride: 107 mmol/L — ABNORMAL HIGH (ref 96–106)
Creatinine, Ser: 1.11 mg/dL — ABNORMAL HIGH (ref 0.57–1.00)
GFR calc Af Amer: 62 mL/min/{1.73_m2} (ref >59)
GFR calc non Af Amer: 54 mL/min/{1.73_m2} — ABNORMAL LOW (ref >59)
Globulin, Total: 2.5 g/dL (ref 1.5–4.5)
Glucose: 83 mg/dL (ref 65–99)
Potassium: 3.7 mmol/L (ref 3.5–5.2)
Sodium: 145 mmol/L — ABNORMAL HIGH (ref 134–144)
Total Protein: 5.9 g/dL — ABNORMAL LOW (ref 6.0–8.5)

## 2017-04-10 LAB — CBC WITH DIFFERENTIAL/PLATELET
Basophils Absolute: 0 10*3/uL (ref 0.0–0.2)
Basos: 0 %
EOS (ABSOLUTE): 0.2 10*3/uL (ref 0.0–0.4)
Eos: 2 %
Hematocrit: 34.5 % (ref 34.0–46.6)
Hemoglobin: 11.6 g/dL (ref 11.1–15.9)
Immature Grans (Abs): 0 10*3/uL (ref 0.0–0.1)
Immature Granulocytes: 0 %
Lymphocytes Absolute: 1.6 10*3/uL (ref 0.7–3.1)
Lymphs: 19 %
MCH: 31 pg (ref 26.6–33.0)
MCHC: 33.6 g/dL (ref 31.5–35.7)
MCV: 92 fL (ref 79–97)
Monocytes Absolute: 0.6 10*3/uL (ref 0.1–0.9)
Monocytes: 7 %
Neutrophils Absolute: 6 10*3/uL (ref 1.4–7.0)
Neutrophils: 72 %
Platelets: 376 10*3/uL (ref 150–379)
RBC: 3.74 x10E6/uL — ABNORMAL LOW (ref 3.77–5.28)
RDW: 13.7 % (ref 12.3–15.4)
WBC: 8.4 10*3/uL (ref 3.4–10.8)

## 2017-04-15 ENCOUNTER — Other Ambulatory Visit: Payer: Self-pay

## 2017-04-15 DIAGNOSIS — R1030 Lower abdominal pain, unspecified: Secondary | ICD-10-CM

## 2017-04-27 ENCOUNTER — Encounter: Payer: Self-pay | Admitting: Nurse Practitioner

## 2017-04-27 ENCOUNTER — Ambulatory Visit
Admission: RE | Admit: 2017-04-27 | Discharge: 2017-04-27 | Disposition: A | Discharge status: Home / Self Care | Payer: Medicare Other | Source: Ambulatory Visit | Attending: Nurse Practitioner | Admitting: Nurse Practitioner

## 2017-04-27 DIAGNOSIS — Z780 Asymptomatic menopausal state: Secondary | ICD-10-CM | POA: Insufficient documentation

## 2017-04-27 DIAGNOSIS — Z1231 Encounter for screening mammogram for malignant neoplasm of breast: Secondary | ICD-10-CM | POA: Diagnosis present

## 2017-04-27 DIAGNOSIS — M85851 Other specified disorders of bone density and structure, right thigh: Secondary | ICD-10-CM | POA: Insufficient documentation

## 2017-04-27 DIAGNOSIS — M8580 Other specified disorders of bone density and structure, unspecified site: Secondary | ICD-10-CM | POA: Insufficient documentation

## 2017-05-05 ENCOUNTER — Encounter: Payer: Self-pay | Admitting: Nurse Practitioner

## 2017-05-05 ENCOUNTER — Other Ambulatory Visit: Payer: Self-pay | Admitting: Nurse Practitioner

## 2017-05-05 ENCOUNTER — Ambulatory Visit (INDEPENDENT_AMBULATORY_CARE_PROVIDER_SITE_OTHER): Payer: Medicare Other | Admitting: Nurse Practitioner

## 2017-05-05 VITALS — BP 151/77 | HR 55 | Temp 98.4°F | Ht 59.0 in | Wt 171.4 lb

## 2017-05-05 DIAGNOSIS — Z0000 Encounter for general adult medical examination without abnormal findings: Secondary | ICD-10-CM

## 2017-05-05 DIAGNOSIS — I10 Essential (primary) hypertension: Secondary | ICD-10-CM

## 2017-05-05 DIAGNOSIS — R259 Unspecified abnormal involuntary movements: Secondary | ICD-10-CM | POA: Diagnosis not present

## 2017-05-05 DIAGNOSIS — B3731 Acute candidiasis of vulva and vagina: Secondary | ICD-10-CM

## 2017-05-05 DIAGNOSIS — Z23 Encounter for immunization: Secondary | ICD-10-CM

## 2017-05-05 DIAGNOSIS — B373 Candidiasis of vulva and vagina: Secondary | ICD-10-CM

## 2017-05-05 DIAGNOSIS — G252 Other specified forms of tremor: Secondary | ICD-10-CM | POA: Insufficient documentation

## 2017-05-05 DIAGNOSIS — Z124 Encounter for screening for malignant neoplasm of cervix: Secondary | ICD-10-CM | POA: Diagnosis not present

## 2017-05-05 MED ORDER — PROPRANOLOL HCL 40 MG PO TABS: 40.0000 mg | ORAL_TABLET | Freq: Two times a day (BID) | ORAL | 5 refills | Status: DC

## 2017-05-05 MED ORDER — ZOSTER VAC RECOMB ADJUVANTED 50 MCG/0.5ML IM SUSR: 0.5000 mL | Freq: Once | INTRAMUSCULAR | 1 refills | Status: AC

## 2017-05-05 MED ORDER — FLUCONAZOLE 150 MG PO TABS: 150.0000 mg | ORAL_TABLET | Freq: Once | ORAL | 0 refills | Status: DC

## 2017-05-05 NOTE — Progress Notes (Signed)
Subjective:    Patient ID: Elizabeth [name], female    DOB: Jan 30, 1956, 61 y.o.   MRN: [id number]  Marieke Lubke is a 61 y.o. female presenting on 05/05/2017 for Annual Exam   HPI Annual Physical Exam Patient has been feeling well.  They have acute concerns today about worsening resting tremor. Sleeps 9-10 hours per night uninterrupted and feels is "just enough."  HEALTH MAINTENANCE: Weight/BMI: obese, but down 5 lbs  Goal is 140 lbs. Physical activity: no extra exercise, has gym membership.  Housework otherwise. Diet: cutting back portion sizes, some fried foods, low sugars/sweets, limiting w/ carbs, vegetables and fruits Seatbelt: always Sunscreen: regularly PAP: due today (Hx genital warts)  Mammogram: 04/27/17 - awaiting result DEXA: complete 04/27/17 Colon Cancer Screen: 03/2013 HIV/HEP C: done and negative Optometry: two years ago Dentistry: 2007 - no trouble with teeth.  Sensitivity on right side, chip on left side.  VACCINES: Tetanus: up to date Influenza: needs today Pneumonia: has had one Shingles: interested, but medicare will not cover in clinic - go to Rx.  Resting Tremor Pt notes she is having resting tremor more frequently than in past.  Feels her propranolol has helped in past, but doesn't seem to be helping now.  Otherwise is not having any other symptoms.  Past Medical History:  Diagnosis Date  . Anxiety   . Depression   . GERD (gastroesophageal reflux disease)   . Insomnia   . Obesity    Past Surgical History:  Procedure Laterality Date  . CHOLECYSTECTOMY  2004   repeat 2014  . COLONOSCOPY  08/05/2011   Social History   Social History  . Marital status: Widowed    Spouse name: N/A  . Number of children: N/A  . Years of education: N/A   Occupational History  . Not on file.   Social History Main Topics  . Smoking status: Current Every Day Smoker    Packs/day: 0.25    Years: 40.00    Types: Cigarettes  . Smokeless tobacco:  Never Used     Comment: pt smoking 8 cigs daily  . Alcohol use No     Comment: once a month   . Drug use: No  . Sexual activity: Yes    Birth control/ protection: Injection   Other Topics Concern  . Not on file   Social History Narrative  . No narrative on file   Family History  Problem Relation Age of Onset  . Breast cancer Maternal Aunt 70  . Alzheimer's disease Mother   . Stroke Father   . Hypertension Father   . Multiple sclerosis Sister   . Hypertension Sister   . Colon cancer Sister   . Breast cancer Cousin        first cousin   Current Outpatient Prescriptions on File Prior to Visit  Medication Sig  . ARIPiprazole (ABILIFY) 5 MG tablet Take 5 mg by mouth daily.  Marland Kitchen aspirin 81 MG tablet Take 81 mg by mouth daily.  . Blood Pressure Monitoring (SM WRIST CUFF BP MONITOR) MISC 1 Units by Does not apply route daily.  . Cholecalciferol (VITAMIN D-3) 1000 units CAPS Take 1 capsule by mouth daily.  . citalopram (CELEXA) 40 MG tablet   . fluticasone (FLONASE) 50 MCG/ACT nasal spray Place 2 sprays into both nostrils daily.  Marland Kitchen loratadine (ALLERGY RELIEF) 10 MG tablet Take 10 mg by mouth daily.  . Omega-3 Fatty Acids (FISH OIL) 500 MG CAPS Take 1 capsule by mouth  2 (two) times daily.  Marland Kitchen omeprazole (PRILOSEC) 20 MG capsule Take 1 capsule (20 mg total) by mouth daily.  . propranolol (INDERAL) 20 MG tablet Take 1 tablet (20 mg total) by mouth 2 (two) times daily.   No current facility-administered medications on file prior to visit.     Review of Systems  Neurological: Positive for tremors.   Per HPI unless specifically indicated above     Objective:    BP (!) 151/77 (BP Location: Right Arm, Patient Position: Sitting, Cuff Size: Normal)   Pulse (!) 55   Temp 98.4 F (36.9 C) (Oral)   Ht  (1.499 m)   Wt 171 lb 6.4 oz (77.7 kg)   BMI 34.62 kg/m   Wt Readings from Last 3 Encounters:  05/05/17 171 lb 6.4 oz (77.7 kg)  04/09/17 176 lb 3.2 oz (79.9 kg)  03/03/17 174  lb (78.9 kg)    Physical Exam  General - obese, well-appearing, NAD HEENT - Normocephalic, atraumatic, PERRL, EOMI, patent nares w/o congestion, oropharynx clear, MMM Neck - supple, non-tender, no LAD, no thyromegaly, no carotid bruit Heart - RRR, no murmurs heard Lungs - Clear throughout all lobes, no wheezing, crackles, or rhonchi. Normal work of breathing. Abdomen - soft, NTND, no masses, no hepatosplenomegaly, active bowel sounds GU - Normal external female genitalia without lesions or fusion. Vaginal canal without lesions. Normal appearing cervix without lesions or friability. Thick white discharge on exam. Bimanual exam without adnexal masses, enlarged uterus, or cervical motion tenderness. Extremeties - non-tender, no edema, cap refill < 2 seconds, peripheral pulses intact +2 bilaterally Skin - warm, dry, no rashes Neuro - awake, alert, oriented x3, CN II-X intact, intact muscle strength 5/5 bilaterally, intact distal sensation to light touch, normal coordination, normal gait, essential tremor present at rest in right arm/hand resolves w/ movement/muscle activation Psych - Normal mood and affect, normal behavior   Results for orders placed or performed in visit on 04/09/17  CBC with Differential/Platelet  Result Value Ref Range   WBC 8.4 3.4 - 10.8 x10E3/uL   RBC 3.74 (L) 3.77 - 5.28 x10E6/uL   Hemoglobin 11.6 11.1 - 15.9 g/dL   Hematocrit 91.4 78.2 - 46.6 %   MCV 92 79 - 97 fL   MCH 31.0 26.6 - 33.0 pg   MCHC 33.6 31.5 - 35.7 g/dL   RDW 95.6 21.3 - 08.6 %   Platelets 376 150 - 379 x10E3/uL   Neutrophils 72 Not Estab. %   Lymphs 19 Not Estab. %   Monocytes 7 Not Estab. %   Eos 2 Not Estab. %   Basos 0 Not Estab. %   Neutrophils Absolute 6.0 1.4 - 7.0 x10E3/uL   Lymphocytes Absolute 1.6 0.7 - 3.1 x10E3/uL   Monocytes Absolute 0.6 0.1 - 0.9 x10E3/uL   EOS (ABSOLUTE) 0.2 0.0 - 0.4 x10E3/uL   Basophils Absolute 0.0 0.0 - 0.2 x10E3/uL   Immature Granulocytes 0 Not Estab. %    Immature Grans (Abs) 0.0 0.0 - 0.1 x10E3/uL  Comprehensive metabolic panel  Result Value Ref Range   Glucose 83 65 - 99 mg/dL   BUN 6 (L) 8 - 27 mg/dL   Creatinine, Ser 5.78 (H) 0.57 - 1.00 mg/dL   GFR calc non Af Amer 54 (L) >59 mL/min/1.73   GFR calc Af Amer 62 >59 mL/min/1.73   BUN/Creatinine Ratio 5 (L) 12 - 28   Sodium 145 (H) 134 - 144 mmol/L   Potassium 3.7 3.5 - 5.2 mmol/L  Chloride 107 (H) 96 - 106 mmol/L   CO2 23 20 - 29 mmol/L   Calcium 8.6 (L) 8.7 - 10.3 mg/dL   Total Protein 5.9 (L) 6.0 - 8.5 g/dL   Albumin 3.4 (L) 3.6 - 4.8 g/dL   Globulin, Total 2.5 1.5 - 4.5 g/dL   Albumin/Globulin Ratio 1.4 1.2 - 2.2   Bilirubin Total 0.5 0.0 - 1.2 mg/dL   Alkaline Phosphatase 83 39 - 117 IU/L   AST 15 0 - 40 IU/L   ALT 11 0 - 32 IU/L      Assessment & Plan:   Problem List Items Addressed This Visit      Cardiovascular and Mediastinum   Hypertension    Uncontrolled hypertension today w/ BP above goal.  Pt working on lifestyle modifications for low salt diet and is preparing to exercise at gym 3 days per week.  Taking medications tolerating well without side effects.   Plan: 1. INCREASE propranolol: Take 40 mg twice daily.   2. Encouraged heart healthy diet and increasing exercise. 3. Check BP 1-2 x per week at home, keep log, and bring to clinic at next appointment. 4. Follow up in January as scheduled.       Relevant Medications   propranolol (INDERAL) 40 MG tablet     Other   Resting tremor    Worsening severity, but not interfering w/ daily activities.  Plan: 1. Increase propranolol to 40 mg bid instead of once daily. 2. Continue to monitor.  May be start of parkinson's disease.  Consider neurology referral if needed. 3. Follow up as scheduled in January.       Other Visit Diagnoses    Needs flu shot    -  Primary Administer quadrivalent flu today age < 37.   Relevant Orders   Flu Vaccine QUAD 6+ mos PF IM (Fluarix Quad PF) (Completed)   Need for  shingles vaccine     Order sent to commercial pharmacy.  Ensure is repeated in 2-6 months after first dose.    Vaginal candidiasis     Identified on pap.  Treat w/ diflucan 150 mg tablet.  Take one tablet once.    Cervical cancer screening     Cervical cancer screening due.  Pt agrees to pap testing today.  Vaginal and bimanual exam are normal.  Plan: 1. ThinPrep pap w/ reflex HPV.    Relevant Orders   Pap Lb, rfx HPV ASCU   Encounter for annual physical exam     Physical exam with findings as addressed above.  Well adult with no other acute concerns.  Plan: 1. Obtain health maintenance screenings. 2. Return 1 year for annual physical.       Meds ordered this encounter  Medications  . Zoster Vac Recomb Adjuvanted [facility]) injection    Sig: Inject 0.5 mLs into the muscle once. Repeat in 2 months    Dispense:  0.5 mL    Refill:  1    Order Specific Question:   Supervising Provider    Answer:   Smitty Cords [[id number]]  . propranolol (INDERAL) 40 MG tablet    Sig: Take 1 tablet (40 mg total) by mouth 2 (two) times daily.    Dispense:  60 tablet    Refill:  5    Order Specific Question:   Supervising Provider    Answer:   Smitty Cords [[id number]]  . fluconazole (DIFLUCAN) 150 MG tablet    Sig: Take 1 tablet (150  mg total) by mouth once.    Dispense:  1 tablet    Refill:  0    Order Specific Question:   Supervising Provider    Answer:   Smitty Cords [[id number]]      Follow up plan: Return as scheduled in January.  Wilhelmina Mcardle, DNP, AGPCNP-BC Adult Gerontology Primary Care Nurse Practitioner [facility] 05/05/2017, 2:48 PM

## 2017-05-05 NOTE — Patient Instructions (Addendum)
Martine, Thank you for coming in to clinic today.  1. If you do not hear about your mammogram results in about 2 weeks, please call us or Breast center in [place].  2. You have a yeast infection. Take diflucan  once today.  3. FOr your tremor and blood pressure: - INCREASE propranolol to total of 40 mg twice daily.  Until new bottle, take 2 tablets twice daily.   When you get your new bottle, Take 40 mg tablet.  One tablet twice daily.  Please schedule a follow-up appointment with Wilhelmina Mcardle, AGNP. Return for as scheduled in January.  If you have any other questions or concerns, please feel free to call the clinic or send a message through MyChart. You may also schedule an earlier appointment if necessary.  You will receive a survey after today's visit either digitally by e-mail or paper by [employer]. Your experiences and feedback matter to [place].  Please respond so we know how we are doing as we provide care for you.   Wilhelmina Mcardle, DNP, AGNP-BC Adult Gerontology Nurse Practitioner [facility], [facility]

## 2017-05-06 LAB — PAP LB, RFX HPV ASCU: PAP Smear Comment: 0

## 2017-05-06 MED ORDER — FLUCONAZOLE 150 MG PO TABS: 150.0000 mg | ORAL_TABLET | Freq: Once | ORAL | 0 refills | Status: AC

## 2017-05-07 ENCOUNTER — Encounter: Payer: Self-pay | Admitting: Nurse Practitioner

## 2017-05-12 NOTE — Assessment & Plan Note (Signed)
Uncontrolled hypertension today w/ BP above goal.  Pt working on lifestyle modifications for low salt diet and is preparing to exercise at gym 3 days per week.  Taking medications tolerating well without side effects.   Plan: 1. INCREASE propranolol: Take 40 mg twice daily.   2. Encouraged heart healthy diet and increasing exercise. 3. Check BP 1-2 x per week at home, keep log, and bring to clinic at next appointment. 4. Follow up in January as scheduled.

## 2017-05-12 NOTE — Assessment & Plan Note (Signed)
Worsening severity, but not interfering w/ daily activities.  Plan: 1. Increase propranolol to 40 mg bid instead of once daily. 2. Continue to monitor.  May be start of parkinson's disease.  Consider neurology referral if needed. 3. Follow up as scheduled in January.

## 2017-05-15 ENCOUNTER — Ambulatory Visit: Payer: Medicare Other

## 2017-05-25 ENCOUNTER — Encounter: Payer: Self-pay | Admitting: Nurse Practitioner

## 2017-07-02 ENCOUNTER — Encounter: Payer: Self-pay | Admitting: Nurse Practitioner

## 2017-07-06 ENCOUNTER — Other Ambulatory Visit: Payer: Self-pay

## 2017-07-06 DIAGNOSIS — Z889 Allergy status to unspecified drugs, medicaments and biological substances status: Secondary | ICD-10-CM

## 2017-07-06 DIAGNOSIS — K219 Gastro-esophageal reflux disease without esophagitis: Secondary | ICD-10-CM

## 2017-07-06 DIAGNOSIS — I10 Essential (primary) hypertension: Secondary | ICD-10-CM

## 2017-07-06 MED ORDER — OMEPRAZOLE 20 MG PO CPDR: 20.0000 mg | DELAYED_RELEASE_CAPSULE | Freq: Every day | ORAL | 3 refills | Status: DC

## 2017-07-06 MED ORDER — FLUTICASONE PROPIONATE 50 MCG/ACT NA SUSP: 2.0000 | Freq: Every day | NASAL | 1 refills | Status: DC

## 2017-07-06 MED ORDER — PROPRANOLOL HCL 40 MG PO TABS: 40.0000 mg | ORAL_TABLET | Freq: Two times a day (BID) | ORAL | 1 refills | Status: DC

## 2017-08-24 ENCOUNTER — Other Ambulatory Visit: Payer: Self-pay | Admitting: Nurse Practitioner

## 2017-08-24 DIAGNOSIS — I10 Essential (primary) hypertension: Secondary | ICD-10-CM

## 2017-09-01 ENCOUNTER — Ambulatory Visit (INDEPENDENT_AMBULATORY_CARE_PROVIDER_SITE_OTHER): Payer: Medicare Other | Admitting: Nurse Practitioner

## 2017-09-01 ENCOUNTER — Encounter: Payer: Self-pay | Admitting: Nurse Practitioner

## 2017-09-01 VITALS — BP 158/52 | Temp 98.6°F | Ht 59.0 in | Wt 177.4 lb

## 2017-09-01 DIAGNOSIS — R259 Unspecified abnormal involuntary movements: Secondary | ICD-10-CM | POA: Diagnosis not present

## 2017-09-01 DIAGNOSIS — E782 Mixed hyperlipidemia: Secondary | ICD-10-CM

## 2017-09-01 DIAGNOSIS — I10 Essential (primary) hypertension: Secondary | ICD-10-CM | POA: Diagnosis not present

## 2017-09-01 DIAGNOSIS — G252 Other specified forms of tremor: Secondary | ICD-10-CM

## 2017-09-01 MED ORDER — OMEGA-3-ACID ETHYL ESTERS 1 G PO CAPS: 1.0000 g | ORAL_CAPSULE | Freq: Two times a day (BID) | ORAL | 2 refills | Status: DC

## 2017-09-01 MED ORDER — PROPRANOLOL HCL 40 MG PO TABS: 60.0000 mg | ORAL_TABLET | Freq: Two times a day (BID) | ORAL | 0 refills | Status: DC

## 2017-09-01 NOTE — Progress Notes (Signed)
Subjective:    Patient ID: Elizabeth [name], female    DOB: 27-Apr-1956, 62 y.o.   MRN: [id number]  Elizabeth [name] is a 62 y.o. female presenting on 09/01/2017 for Hypertension (follow-up)   HPI Hypertension - She is checking BP at home or outside of clinic.  Readings inaccurate on home machine - Current medications: propranolol 40 mg bid, tolerating well without side effects - She is not currently symptomatic. - Pt denies headache, lightheadedness, dizziness, changes in vision, chest tightness/pressure, palpitations, leg swelling, sudden loss of speech or loss of consciousness. - She  reports no regular exercise routine. - Her diet is high in salt, high in fat, and moderate in carbohydrates. Is increasing vegetables.  Tremor No significant improvement in tremor on propranolol.  Pt notes she had previously taken a higher dose of propranolol and it was effective at helping control her tremor.  Abilify as prescribed most recently by psychiatry caused increased tremor.  Pt notes she had tremor and has taken propranolol since initial onset of tremors with an antipsychotic many years ago.  Is only a resting tremor.  Does not occur with activity.  Pt does have a family history of parkinson's disease and is somewhat concerned about her tremor as a symptom of Parkinson's.  Tremor is only noted in hands and occurs bilaterally.  Social History   Tobacco Use  . Smoking status: Current Every Day Smoker    Packs/day: 0.25    Years: 40.00    Pack years: 10.00    Types: Cigarettes  . Smokeless tobacco: Never Used  . Tobacco comment: pt smoking 8 cigs daily  Substance Use Topics  . Alcohol use: No    Comment: once a month   . Drug use: No    Review of Systems Per HPI unless specifically indicated above     Objective:    BP (!) 158/52 (BP Location: Right Wrist, Patient Position: Sitting) Comment: pt peronal bp medication  Temp 98.6 F (37 C) (Oral)   Ht 4\' 11"  (1.499 m)   Wt  177 lb 6.4 oz (80.5 kg)   BMI 35.83 kg/m  Pulse: Wt Readings from Last 3 Encounters:  09/01/17 177 lb 6.4 oz (80.5 kg)  05/05/17 171 lb 6.4 oz (77.7 kg)  04/09/17 176 lb 3.2 oz (79.9 kg)    Physical Exam  General - obese, well-appearing, NAD HEENT - Normocephalic, atraumatic Heart - RRR, normal S1/S2, no murmurs heard Lungs - Clear throughout all lobes, no wheezing, crackles, or rhonchi. Normal work of breathing. Extremeties - non-tender, no edema, cap refill < 2 seconds, peripheral pulses intact +2 bilaterally Skin - warm, dry Neuro - awake, alert, oriented x3, CN II-X intact, intact muscle strength 5/5 bilaterally, intact distal sensation to light touch, normal coordination, normal gait, resting tremor Psych - Normal mood and affect, normal behavior   Results for orders placed or performed in visit on 05/05/17  Pap Lb, rfx HPV ASCU  Result Value Ref Range   DIAGNOSIS: Comment    Specimen adequacy: Comment    Clinician Provided ICD10 Comment    Performed by: Comment    PAP Smear Comment .    Note: Comment    PAP Reflex Comment       Assessment & Plan:   Problem List Items Addressed This Visit      Cardiovascular and Mediastinum   Hypertension    Uncontrolled hypertension today w/ BP above goal.  BP also elevated at last visit.  Pt  has increased to propranolol 40 mg bid, but has persistently elevated BP.  Pt working on lifestyle modifications for low salt diet.  SHe has not increased physical activity.  Taking medications tolerating well without side effects.   Plan: 1. INCREASE propranolol: Take 60 mg twice daily.  If well tolerated without HR < 55 or any dizziness, will change to Propranolol XR 120 mg tablet. - Consider lisinopril or HCTZ if hypertensive with increased propranolol. 2. Encouraged heart healthy diet and increasing exercise. 3. Check BP 1-2 x per week at home, keep log, and bring to clinic at next appointment. 4. Follow up 3 months.       Relevant  Medications   propranolol (INDERAL) 40 MG tablet   omega-3 acid ethyl esters (LOVAZA) 1 g capsule     Other   Resting tremor    Stable for pt, but notes her daughter is now speaking about it more frequently.  No change with propranolol 40 mg bid.  Is not interfering w/ daily activities.  Plan: 1. Increase propranolol to 60 mg bid instead of once daily. 2. Continue to monitor.  May be start of parkinson's disease.  Consider neurology referral if needed. 3. Follow up 3 months and make neurology referral at that time with no improvement.      Mixed hyperlipidemia - Primary    Patient with mixed hyperlipidemia.  Should start statin medication if any ASCVD event symptoms, but currently pt declines and is not absolutely indicated.  Pt is not able to afford fish oil currently, so requests possible prescription.  START lovaza one tablet 2x daily for fish oil supplement.      Relevant Medications   propranolol (INDERAL) 40 MG tablet   omega-3 acid ethyl esters (LOVAZA) 1 g capsule      Meds ordered this encounter  Medications  . propranolol (INDERAL) 40 MG tablet    Sig: Take 1.5 tablets (60 mg total) by mouth 2 (two) times daily.    Dispense:  180 tablet    Refill:  0    Order Specific Question:   Supervising Provider    Answer:   Smitty CordsKARAMALEGOS, ALEXANDER J [[id number]]  . omega-3 acid ethyl esters (LOVAZA) 1 g capsule    Sig: Take 1 capsule (1 g total) by mouth 2 (two) times daily.    Dispense:  60 capsule    Refill:  2    Order Specific Question:   Supervising Provider    Answer:   Smitty CordsKARAMALEGOS, ALEXANDER J [[id number]]   Follow up plan: Return in about 3 months (around 11/30/2017) for hypertension and tremor.  Wilhelmina McardleLauren Odel Schmid, DNP, AGPCNP-BC Adult Gerontology Primary Care Nurse Practitioner [facility] 09/01/2017, 5:55 PM

## 2017-09-01 NOTE — Assessment & Plan Note (Signed)
Patient with mixed hyperlipidemia.  Should start statin medication if any ASCVD event symptoms, but currently pt declines and is not absolutely indicated.  Pt is not able to afford fish oil currently, so requests possible prescription.  START lovaza one tablet 2x daily for fish oil supplement.

## 2017-09-01 NOTE — Patient Instructions (Addendum)
Elizabeth [name], Thank you for coming in to clinic today.  1. For your blood pressure, you currently have great control.  Your tremor is most likely causing the wrist cuff to provide false readings.  If you get another BP machine, make sure it is an upper arm cuff.  2. For your tremor, Increase medication to 60 mg twice daily.  Take 1.5 tablets twice daily.  We can switch to a long acting pill if you keep your HR above 55 with this dose.    Please schedule a follow-up appointment with Wilhelmina McardleLauren Joud Pettinato, AGNP. Return in about 3 months (around 11/30/2017) for hypertension and tremor.  If you have any other questions or concerns, please feel free to call the clinic or send a message through MyChart. You may also schedule an earlier appointment if necessary.  You will receive a survey after today's visit either digitally by e-mail or paper by [employer] mail. Your experiences and feedback matter to us.  Please respond so we know how we are doing as we provide care for you.   Wilhelmina McardleLauren Karolyn Messing, DNP, AGNP-BC Adult Gerontology Nurse Practitioner [facility], [facility]

## 2017-09-01 NOTE — Assessment & Plan Note (Signed)
Uncontrolled hypertension today w/ BP above goal.  BP also elevated at last visit.  Pt has increased to propranolol 40 mg bid, but has persistently elevated BP.  Pt working on lifestyle modifications for low salt diet.  SHe has not increased physical activity.  Taking medications tolerating well without side effects.   Plan: 1. INCREASE propranolol: Take 60 mg twice daily.  If well tolerated without HR < 55 or any dizziness, will change to Propranolol XR 120 mg tablet. - Consider lisinopril or HCTZ if hypertensive with increased propranolol. 2. Encouraged heart healthy diet and increasing exercise. 3. Check BP 1-2 x per week at home, keep log, and bring to clinic at next appointment. 4. Follow up 3 months.

## 2017-09-01 NOTE — Assessment & Plan Note (Signed)
Stable for pt, but notes her daughter is now speaking about it more frequently.  No change with propranolol 40 mg bid.  Is not interfering w/ daily activities.  Plan: 1. Increase propranolol to 60 mg bid instead of once daily. 2. Continue to monitor.  May be start of parkinson's disease.  Consider neurology referral if needed. 3. Follow up 3 months and make neurology referral at that time with no improvement.

## 2017-09-21 ENCOUNTER — Encounter: Payer: Self-pay | Admitting: Nurse Practitioner

## 2017-09-21 DIAGNOSIS — G252 Other specified forms of tremor: Secondary | ICD-10-CM

## 2017-09-21 DIAGNOSIS — R259 Unspecified abnormal involuntary movements: Principal | ICD-10-CM

## 2017-09-21 MED ORDER — PROPRANOLOL HCL ER 120 MG PO CP24: 120.0000 mg | ORAL_CAPSULE | Freq: Every day | ORAL | 1 refills | Status: DC

## 2017-09-30 ENCOUNTER — Encounter: Payer: Self-pay | Admitting: Nurse Practitioner

## 2017-11-22 ENCOUNTER — Other Ambulatory Visit: Payer: Self-pay | Admitting: Nurse Practitioner

## 2017-11-22 DIAGNOSIS — Z889 Allergy status to unspecified drugs, medicaments and biological substances status: Secondary | ICD-10-CM

## 2017-12-01 ENCOUNTER — Ambulatory Visit: Payer: Medicare Other | Admitting: Nurse Practitioner

## 2017-12-09 ENCOUNTER — Telehealth: Payer: Self-pay

## 2017-12-10 NOTE — Telephone Encounter (Signed)
Medication refill

## 2017-12-11 ENCOUNTER — Encounter: Payer: Self-pay | Admitting: Nurse Practitioner

## 2017-12-11 ENCOUNTER — Ambulatory Visit (INDEPENDENT_AMBULATORY_CARE_PROVIDER_SITE_OTHER): Payer: Medicare Other | Admitting: Nurse Practitioner

## 2017-12-11 VITALS — BP 142/72 | HR 57 | Temp 98.5°F | Resp 16 | Ht 59.0 in | Wt 182.6 lb

## 2017-12-11 DIAGNOSIS — I10 Essential (primary) hypertension: Secondary | ICD-10-CM | POA: Diagnosis not present

## 2017-12-11 DIAGNOSIS — F39 Unspecified mood [affective] disorder: Secondary | ICD-10-CM

## 2017-12-11 DIAGNOSIS — R259 Unspecified abnormal involuntary movements: Secondary | ICD-10-CM | POA: Diagnosis not present

## 2017-12-11 DIAGNOSIS — G252 Other specified forms of tremor: Secondary | ICD-10-CM

## 2017-12-11 DIAGNOSIS — F419 Anxiety disorder, unspecified: Secondary | ICD-10-CM | POA: Insufficient documentation

## 2017-12-11 MED ORDER — CITALOPRAM HYDROBROMIDE 40 MG PO TABS: 40.0000 mg | ORAL_TABLET | Freq: Every day | ORAL | 3 refills | Status: DC

## 2017-12-11 MED ORDER — LISINOPRIL 10 MG PO TABS: 10.0000 mg | ORAL_TABLET | Freq: Every day | ORAL | 1 refills | Status: DC

## 2017-12-11 MED ORDER — ARIPIPRAZOLE 5 MG PO TABS: 5.0000 mg | ORAL_TABLET | Freq: Every day | ORAL | 3 refills | Status: DC

## 2017-12-11 MED ORDER — FISH OIL 500 MG PO CAPS: 1.0000 | ORAL_CAPSULE | Freq: Every day | ORAL | Status: DC

## 2017-12-11 NOTE — Patient Instructions (Addendum)
Elizabeth [name],   Thank you for coming in to clinic today.  1. Add lisinopril 10 mg once daily for blood pressure.  Labs in 2 weeks  Please schedule a follow-up appointment with Wilhelmina Mcardle, AGNP. Return in about 3 months (around 03/13/2018) for hypertension.  If you have any other questions or concerns, please feel free to call the clinic or send a message through MyChart. You may also schedule an earlier appointment if necessary.  You will receive a survey after today's visit either digitally by e-mail or paper by [employer]. Your experiences and feedback matter to [place].  Please respond so we know how we are doing as we provide care for you.   Wilhelmina Mcardle, DNP, AGNP-BC Adult Gerontology Nurse Practitioner [facility], [facility]

## 2017-12-11 NOTE — Progress Notes (Signed)
Subjective:    Patient ID: Elizabeth [name], female    DOB: 06-15-1956, 62 y.o.   MRN: [id number]  Elizabeth [name] is a 62 y.o. female presenting on 12/11/2017 for Hypertension   HPI Hypertension - She is checking BP at home or outside of clinic.    - Current medications propranolol tolerating well without side effects - She is not currently symptomatic. - Pt denies headache, lightheadedness, dizziness, changes in vision, chest tightness/pressure, palpitations, leg swelling, sudden loss of speech or loss of consciousness.  Tremor Stable and slightly improved from 80 mg to 120 mg dose.  Pt tolerating propranolol ER 120 mg once daily without side effects.  Mood Disorder Has been very stable with [facility] management and is no longer working on anything with her therapist.  She has very little interaction with psychiatrist.   - Uses redirection and refocusing on positive.  Now has a rescue dog that is providing companionship. Depression screen [facility] 2/9 12/11/2017 02/17/2017 10/21/2016 04/29/2016 01/22/2016  Decreased Interest 0 0 0 0 0  Down, Depressed, Hopeless 0 0 0 0 0  PHQ - 2 Score 0 0 0 0 0  Altered sleeping 0 - - - -  Tired, decreased energy 1 - - - -  Change in appetite 0 - - - -  Feeling bad or failure about yourself  0 - - - -  Trouble concentrating 0 - - - -  Moving slowly or fidgety/restless 0 - - - -  Suicidal thoughts 0 - - - -  PHQ-9 Score 1 - - - -  Difficult doing work/chores Somewhat difficult - - - -    GAD 7 : Generalized Anxiety Score 12/11/2017  Nervous, Anxious, on Edge 2  Control/stop worrying 2  Worry too much - different things 2  Trouble relaxing 2  Restless 2  Easily annoyed or irritable 2  Afraid - awful might happen 2  Total GAD 7 Score 14  Anxiety Difficulty Somewhat difficult     Social History   Tobacco Use  . Smoking status: Current Every Day Smoker    Packs/day: 0.25    Years: 40.00    Pack years: 10.00    Types: Cigarettes  .  Smokeless tobacco: Never Used  . Tobacco comment: pt smoking 8 cigs daily  Substance Use Topics  . Alcohol use: No    Comment: once a month   . Drug use: No    Review of Systems Per HPI unless specifically indicated above     Objective:    BP (!) 142/72   Pulse (!) 57   Temp 98.5 F (36.9 C) (Oral)   Resp 16   Ht  (1.499 m)   Wt 182 lb 9.6 oz (82.8 kg)   BMI 36.88 kg/m    Wt Readings from Last 3 Encounters:  12/11/17 182 lb 9.6 oz (82.8 kg)  09/01/17 177 lb 6.4 oz (80.5 kg)  05/05/17 171 lb 6.4 oz (77.7 kg)    Physical Exam  Constitutional: She is oriented to person, place, and time. She appears well-developed and well-nourished. No distress.  HENT:  Head: Normocephalic and atraumatic.  Neck: Normal range of motion. Neck supple. Carotid bruit is not present.  Cardiovascular: Normal rate, regular rhythm, S1 normal, S2 normal, normal heart sounds and intact distal pulses.  Pulmonary/Chest: Effort normal and breath sounds normal. No respiratory distress.  Abdominal: Soft. Bowel sounds are normal. She exhibits no distension.  Musculoskeletal: She exhibits no edema (pedal).  Neurological: She is alert and oriented to person, place, and time. She has normal strength. She displays tremor (resting tremor of hands bilaterally). No cranial nerve deficit or sensory deficit. She displays a negative Romberg sign. Coordination and gait normal.  Skin: Skin is warm and dry.  Psychiatric: She has a normal mood and affect. Her behavior is normal. Judgment and thought content normal. Cognition and memory are normal. She expresses no homicidal and no suicidal ideation. She is communicative. She is attentive.  Vitals reviewed.     Results for orders placed or performed in visit on 05/05/17  Pap Lb, rfx HPV ASCU  Result Value Ref Range   DIAGNOSIS: Comment    Specimen adequacy: Comment    Clinician Provided ICD10 Comment    Performed by: Comment    PAP Smear Comment .    Note:  Comment    PAP Reflex Comment       Assessment & Plan:   Problem List Items Addressed This Visit      Cardiovascular and Mediastinum   Hypertension - Primary    Uncontrolled hypertension today w/ BP above goal.  BP also elevated at last visit.  Pt has increased to propranolol 40 mg bid, but has persistently elevated BP.  Pt working on lifestyle modifications for low salt diet.  SHe has not increased physical activity.  Taking medications tolerating well without side effects.   Plan: 1. INCREASE propranolol: Take 60 mg twice daily.  If well tolerated without HR < 55 or any dizziness, will change to Propranolol XR 120 mg tablet. - START lisinopril 10 mg once daily.  Discussed possible side effects of angioedema (rare), cough (common and reversible), kidney damage (rare, increase monitoring with start). 2. Encouraged heart healthy diet and increasing exercise. 3. Check BP 1-2 x per week at home, keep log, and bring to clinic at next appointment. 4. Follow up 3 months.       Relevant Medications   lisinopril (PRINIVIL,ZESTRIL) 10 MG tablet   Other Relevant Orders   Basic Metabolic Panel (BMET) (Completed)     Other   Mood disorder (HCC)    Stable in past and currently.  Pt requests transition to PCP as she has not had any significant changes in psychiatric care in last 12 months.  Encouraged pt to use her coping mechanisms for non-pharm stress management.  Continue medication today without changes. Refills provided today.  Followup 6 months.      Relevant Medications   ARIPiprazole (ABILIFY) 5 MG tablet   citalopram (CELEXA) 40 MG tablet   Resting tremor    Stable for pt and improved on propranolol at 80 mg daily.  Is not interfering w/ daily activities.  Plan: 1. Continue propranolol ER at 120 mg daily. 2. Continue to monitor.  May be start of parkinson's disease.  Consider neurology referral if needed. 3. Follow up 3 months and make neurology referral at that time if needed.  Pt  may consider in future.      Anxiety   Relevant Medications   ARIPiprazole (ABILIFY) 5 MG tablet   citalopram (CELEXA) 40 MG tablet      Meds ordered this encounter  Medications  . lisinopril (PRINIVIL,ZESTRIL) 10 MG tablet    Sig: Take 1 tablet (10 mg total) by mouth daily.    Dispense:  90 tablet    Refill:  1    Order Specific Question:   Supervising Provider    Answer:   Smitty Cords [[id number]]  .  ARIPiprazole (ABILIFY) 5 MG tablet    Sig: Take 1 tablet (5 mg total) by mouth daily.    Dispense:  30 tablet    Refill:  3    Order Specific Question:   Supervising Provider    Answer:   Smitty Cords [[id number]]  . citalopram (CELEXA) 40 MG tablet    Sig: Take 1 tablet (40 mg total) by mouth daily.    Dispense:  30 tablet    Refill:  3    Order Specific Question:   Supervising Provider    Answer:   Smitty Cords [[id number]]  . Omega-3 Fatty Acids (FISH OIL) 500 MG CAPS    Sig: Take 1 capsule (500 mg total) by mouth daily.    Order Specific Question:   Supervising Provider    Answer:   Smitty Cords [[id number]]    Follow up plan: Return in about 3 months (around 03/13/2018) for hypertension.  Wilhelmina Mcardle, DNP, AGPCNP-BC Adult Gerontology Primary Care Nurse Practitioner [facility] 01/01/2018, 2:20 PM

## 2017-12-23 ENCOUNTER — Other Ambulatory Visit: Payer: Medicare Other

## 2017-12-23 ENCOUNTER — Other Ambulatory Visit: Payer: Self-pay

## 2017-12-23 DIAGNOSIS — I10 Essential (primary) hypertension: Secondary | ICD-10-CM

## 2017-12-24 LAB — BASIC METABOLIC PANEL
BUN/Creatinine Ratio: 13 (calc) (ref 6–22)
BUN: 17 mg/dL (ref 7–25)
CO2: 27 mmol/L (ref 20–32)
Calcium: 9.9 mg/dL (ref 8.6–10.4)
Chloride: 107 mmol/L (ref 98–110)
Creat: 1.27 mg/dL — ABNORMAL HIGH (ref 0.50–0.99)
Glucose, Bld: 98 mg/dL (ref 65–99)
Potassium: 4 mmol/L (ref 3.5–5.3)
Sodium: 139 mmol/L (ref 135–146)

## 2018-01-01 ENCOUNTER — Encounter: Payer: Self-pay | Admitting: Nurse Practitioner

## 2018-01-01 NOTE — Assessment & Plan Note (Addendum)
Stable for pt and improved on propranolol at 80 mg daily.  Is not interfering w/ daily activities.  Plan: 1. Continue propranolol ER at 120 mg daily. 2. Continue to monitor.  May be start of parkinson's disease.  Consider neurology referral if needed. 3. Follow up 3 months and make neurology referral at that time if needed.  Pt may consider in future.

## 2018-01-01 NOTE — Assessment & Plan Note (Signed)
Uncontrolled hypertension today w/ BP above goal.  BP also elevated at last visit.  Pt has increased to propranolol 40 mg bid, but has persistently elevated BP.  Pt working on lifestyle modifications for low salt diet.  SHe has not increased physical activity.  Taking medications tolerating well without side effects.   Plan: 1. INCREASE propranolol: Take 60 mg twice daily.  If well tolerated without HR < 55 or any dizziness, will change to Propranolol XR 120 mg tablet. - START lisinopril 10 mg once daily.  Discussed possible side effects of angioedema (rare), cough (common and reversible), kidney damage (rare, increase monitoring with start). 2. Encouraged heart healthy diet and increasing exercise. 3. Check BP 1-2 x per week at home, keep log, and bring to clinic at next appointment. 4. Follow up 3 months.

## 2018-01-01 NOTE — Assessment & Plan Note (Signed)
Stable in past and currently.  Pt requests transition to PCP as she has not had any significant changes in psychiatric care in last 12 months.  Encouraged pt to use her coping mechanisms for non-pharm stress management.  Continue medication today without changes. Refills provided today.  Followup 6 months.

## 2018-01-20 ENCOUNTER — Encounter: Payer: Self-pay | Admitting: Nurse Practitioner

## 2018-01-20 DIAGNOSIS — T464X5A Adverse effect of angiotensin-converting-enzyme inhibitors, initial encounter: Secondary | ICD-10-CM

## 2018-01-20 DIAGNOSIS — I10 Essential (primary) hypertension: Secondary | ICD-10-CM

## 2018-01-20 DIAGNOSIS — R05 Cough: Secondary | ICD-10-CM

## 2018-01-21 ENCOUNTER — Other Ambulatory Visit: Payer: Self-pay | Admitting: Nurse Practitioner

## 2018-01-21 DIAGNOSIS — R259 Unspecified abnormal involuntary movements: Principal | ICD-10-CM

## 2018-01-21 DIAGNOSIS — G252 Other specified forms of tremor: Secondary | ICD-10-CM

## 2018-01-25 MED ORDER — LOSARTAN POTASSIUM 50 MG PO TABS: 50.0000 mg | ORAL_TABLET | Freq: Every day | ORAL | 3 refills | Status: DC

## 2018-01-25 NOTE — Addendum Note (Signed)
Addended by: Wilhelmina McardleKENNEDY, Ariday Brinker R on: 01/25/2018 01:26 PM   Modules accepted: Orders

## 2018-01-25 NOTE — Telephone Encounter (Signed)
Called pt to follow up.  Will start losartan 50 mg once daily.  Pt verbalizes understanding and agrees with this plan.

## 2018-02-23 ENCOUNTER — Telehealth: Payer: Self-pay | Admitting: Nurse Practitioner

## 2018-02-23 NOTE — Telephone Encounter (Signed)
Called to schedule Medicare Annual Wellness Visit with Nurse Health Advisor. If patient returns call, please schedule AWV with [facility] any date  °Thank you! °For any questions please contact: °Kathryn Brown [phone]  °Or Skype me at: [email or web address]  ° ° °

## 2018-03-15 ENCOUNTER — Ambulatory Visit: Payer: Medicare Other | Admitting: Nurse Practitioner

## 2018-03-23 ENCOUNTER — Other Ambulatory Visit: Payer: Self-pay

## 2018-03-23 ENCOUNTER — Ambulatory Visit (INDEPENDENT_AMBULATORY_CARE_PROVIDER_SITE_OTHER): Payer: Medicare Other

## 2018-03-23 ENCOUNTER — Ambulatory Visit (INDEPENDENT_AMBULATORY_CARE_PROVIDER_SITE_OTHER): Payer: Medicare Other | Admitting: Nurse Practitioner

## 2018-03-23 ENCOUNTER — Encounter: Payer: Self-pay | Admitting: Nurse Practitioner

## 2018-03-23 VITALS — BP 120/82 | HR 64 | Temp 98.4°F | Ht 59.0 in | Wt 183.2 lb

## 2018-03-23 VITALS — BP 120/82 | HR 64 | Temp 98.4°F | Resp 16 | Ht 59.0 in | Wt 183.2 lb

## 2018-03-23 DIAGNOSIS — Z0000 Encounter for general adult medical examination without abnormal findings: Secondary | ICD-10-CM

## 2018-03-23 DIAGNOSIS — F419 Anxiety disorder, unspecified: Secondary | ICD-10-CM

## 2018-03-23 DIAGNOSIS — I10 Essential (primary) hypertension: Secondary | ICD-10-CM | POA: Diagnosis not present

## 2018-03-23 DIAGNOSIS — F39 Unspecified mood [affective] disorder: Secondary | ICD-10-CM

## 2018-03-23 DIAGNOSIS — K219 Gastro-esophageal reflux disease without esophagitis: Secondary | ICD-10-CM

## 2018-03-23 DIAGNOSIS — R259 Unspecified abnormal involuntary movements: Secondary | ICD-10-CM

## 2018-03-23 DIAGNOSIS — Z1231 Encounter for screening mammogram for malignant neoplasm of breast: Secondary | ICD-10-CM | POA: Diagnosis not present

## 2018-03-23 DIAGNOSIS — Z1239 Encounter for other screening for malignant neoplasm of breast: Secondary | ICD-10-CM

## 2018-03-23 DIAGNOSIS — G252 Other specified forms of tremor: Secondary | ICD-10-CM

## 2018-03-23 MED ORDER — CITALOPRAM HYDROBROMIDE 40 MG PO TABS: 40.0000 mg | ORAL_TABLET | Freq: Every day | ORAL | 3 refills | Status: DC

## 2018-03-23 MED ORDER — ARIPIPRAZOLE 5 MG PO TABS: 5.0000 mg | ORAL_TABLET | Freq: Every day | ORAL | 3 refills | Status: DC

## 2018-03-23 MED ORDER — LOSARTAN POTASSIUM 50 MG PO TABS: 50.0000 mg | ORAL_TABLET | Freq: Every day | ORAL | 3 refills | Status: DC

## 2018-03-23 MED ORDER — PROPRANOLOL HCL ER 120 MG PO CP24: 120.0000 mg | ORAL_CAPSULE | Freq: Every day | ORAL | 1 refills | Status: DC

## 2018-03-23 MED ORDER — OMEPRAZOLE 20 MG PO CPDR: 20.0000 mg | DELAYED_RELEASE_CAPSULE | Freq: Every day | ORAL | 3 refills | Status: DC

## 2018-03-23 NOTE — Assessment & Plan Note (Signed)
Significantly improved with self management and reduction in life stress.  GAD7 much improved and shows anxiety is in partial remission.  Plan: 1. Continue abilify 5 mg once daily. 2. Continue citalopram 40 mg once daily. 3. Followup 6 months.  Reviewed signs and symptoms of when patient would need to seek care here sooner or in emergency room.

## 2018-03-23 NOTE — Progress Notes (Signed)
Subjective:    Patient ID: Elizabeth [name], female    DOB: [birth year], 62 y.o.   MRN: [id number]  Elizabeth [name] is a 62 y.o. female presenting on 03/23/2018 for Hypertension   HPI Hypertension - She is checking BP at home or outside of clinic.  Readings 125/80s, lower in 118/70s late at night - Current medications: losartan, propranolol, tolerating well without side effects - She is not currently symptomatic. - Pt denies headache, lightheadedness, dizziness, changes in vision, chest tightness/pressure, palpitations, leg swelling, sudden loss of speech or loss of consciousness. - She  reports no regular exercise routine. - Her diet is moderate in salt, moderate in fat, and moderate in carbohydrates.   Tremor Stable and improved on propranolol.  Patient continues to have resting tremor, that improves with action.  She also has noted some positional increase of tremor in right hand.  Patient also questions whether tremor is related to past use of antipsychotic medications.  No current acute concerns about tremor.  GERD - She is not currently symptomatic.  - Symptoms started several years ago. This has been associated with heartburn, but none has been experienced in several months.   - Symptoms appear to be worsened by lying down after eating and tomato sauce.   - She denies melena, hematochezia, hematemesis, and coffee ground emesis. They have tried proton pump inhibitor: omeprazole 20 mg onc edaily with relief. Risk factors present for GERD include obesity and NSAID use.  - No prior EGD.   Anxiety and Depression Anxiousness is still experienced.  Has been able to "talk myself down."  No panic attacks.   Overall, feels very well.  Denies SI/HI and has no plans to carry out if SI/HI arise.   Depression screen [facility] 2/9 03/23/2018 03/23/2018 12/11/2017 02/17/2017 10/21/2016  Decreased Interest 0 0 0 0 0  Down, Depressed, Hopeless 0 0 0 0 0  PHQ - 2 Score 0 0 0 0 0  Altered sleeping  1 - 0 - -  Tired, decreased energy 1 - 1 - -  Change in appetite 0 - 0 - -  Feeling bad or failure about yourself  0 - 0 - -  Trouble concentrating 0 - 0 - -  Moving slowly or fidgety/restless 0 - 0 - -  Suicidal thoughts 0 - 0 - -  PHQ-9 Score 2 - 1 - -  Difficult doing work/chores Not difficult at all - Somewhat difficult - -    GAD 7 : Generalized Anxiety Score 03/23/2018 12/11/2017  Nervous, Anxious, on Edge 0 2  Control/stop worrying 1 2  Worry too much - different things 0 2  Trouble relaxing 0 2  Restless 1 2  Easily annoyed or irritable 0 2  Afraid - awful might happen 0 2  Total GAD 7 Score 2 14  Anxiety Difficulty Not difficult at all Somewhat difficult    Social History   Tobacco Use  . Smoking status: Current Every Day Smoker    Packs/day: 0.25    Years: 40.00    Pack years: 10.00    Types: Cigarettes  . Smokeless tobacco: Never Used  . Tobacco comment: pt smoking 8 cigs daily  Substance Use Topics  . Alcohol use: No    Comment: about once a month   . Drug use: No    Review of Systems Per HPI unless specifically indicated above     Objective:    BP 120/82 (BP Location: Right Arm, Patient Position: Sitting,  Cuff Size: Normal)   Pulse 64   Temp 98.4 F (36.9 C) (Oral)   Ht 4\' 11"  (1.499 m)   Wt 183 lb 3.2 oz (83.1 kg)   BMI 37.00 kg/m   Wt Readings from Last 3 Encounters:  03/23/18 183 lb 3.2 oz (83.1 kg)  03/23/18 183 lb 3.2 oz (83.1 kg)  12/11/17 182 lb 9.6 oz (82.8 kg)    Physical Exam  Constitutional: She is oriented to person, place, and time. She appears well-developed and well-nourished. No distress.  HENT:  Head: Normocephalic and atraumatic.  Cardiovascular: Normal rate, regular rhythm, S1 normal, S2 normal, normal heart sounds and intact distal pulses.  Pulmonary/Chest: Effort normal and breath sounds normal. No respiratory distress.  Abdominal: Soft. Bowel sounds are normal. She exhibits no distension. There is no hepatosplenomegaly.  There is no tenderness. No hernia.  Neurological: She is alert and oriented to person, place, and time. She has normal reflexes. She displays tremor (Resting > r arm ,present in bilateral upper extremities). No cranial nerve deficit. She displays a negative Romberg sign. Coordination and gait normal.  Skin: Skin is warm and dry. Capillary refill takes less than 2 seconds.  Psychiatric: She has a normal mood and affect. Her speech is normal and behavior is normal. Judgment and thought content normal. Cognition and memory are normal.  Vitals reviewed.    Results for orders placed or performed in visit on 12/23/17  Basic Metabolic Panel (BMET)  Result Value Ref Range   Glucose, Bld 98 65 - 99 mg/dL   BUN 17 7 - 25 mg/dL   Creat 0.861.27 (H) 5.780.50 - 0.99 mg/dL   BUN/Creatinine Ratio 13 6 - 22 (calc)   Sodium 139 135 - 146 mmol/L   Potassium 4.0 3.5 - 5.3 mmol/L   Chloride 107 98 - 110 mmol/L   CO2 27 20 - 32 mmol/L   Calcium 9.9 8.6 - 10.4 mg/dL      Assessment & Plan:   Problem List Items Addressed This Visit      Cardiovascular and Mediastinum   Hypertension - Primary    Controlled hypertension today w/ BP at goal < 130/80.  BP also controlled and at goal when checking at home.  She has not increased physical activity.  Taking medications tolerating well without side effects.   Plan: 1. Continue propranolol ER 120 mg once daily.  - Continue losartan 50 mg once daily. 2. Encouraged heart healthy diet and increasing exercise. 3. Check BP 1-2 x per week at home, keep log, and bring to clinic at next appointment. 4. Follow up 6 months.       Relevant Medications   losartan (COZAAR) 50 MG tablet   propranolol ER (INDERAL LA) 120 MG 24 hr capsule     Digestive   GERD (gastroesophageal reflux disease)    Stable.  Pt taking omeprazole 20 mg once daily without breakthrough symptoms.  Plan: 1. Continue omeprazole 20 mg once daily.  Reviewed side effects of short and long-term use. 2.  Pt with osteopenia left femur.  Is not taking calcium supplement.  Patient verbalizes understanding of mildly increased risk of worsening bone density due to mild decrease in calcium absorption on PPI 3. Follow up 6 months.      Relevant Medications   omeprazole (PRILOSEC) 20 MG capsule     Other   Mood disorder (HCC)    Stable in past and currently.  Pt continues to feel well and has had more improvement  on PHQ9 today in partial remission on medications.  Encouraged pt to use her coping mechanisms for non-pharm stress management.  Continue medication today without changes. Refills provided today.  Followup 6 months.      Relevant Medications   ARIPiprazole (ABILIFY) 5 MG tablet   citalopram (CELEXA) 40 MG tablet   Resting tremor    Stable for pt and improved on propranolol ER 120 mg daily.  Is not interfering w/ daily activities.  Plan: 1. Continue propranolol ER at 120 mg daily. 2. Referral to Parkinson's disease.  3. Follow up 3-6 months.       Relevant Medications   propranolol ER (INDERAL LA) 120 MG 24 hr capsule   Other Relevant Orders   Ambulatory referral to Neurology   Anxiety    Significantly improved with self management and reduction in life stress.  GAD7 much improved and shows anxiety is in partial remission.  Plan: 1. Continue abilify 5 mg once daily. 2. Continue citalopram 40 mg once daily. 3. Followup 6 months.  Reviewed signs and symptoms of when patient would need to seek care here sooner or in emergency room.      Relevant Medications   ARIPiprazole (ABILIFY) 5 MG tablet   citalopram (CELEXA) 40 MG tablet      Meds ordered this encounter  Medications  . ARIPiprazole (ABILIFY) 5 MG tablet    Sig: Take 1 tablet (5 mg total) by mouth daily.    Dispense:  30 tablet    Refill:  3    Order Specific Question:   Supervising Provider    Answer:   Smitty CordsKARAMALEGOS, ALEXANDER J [[id number]]  . citalopram (CELEXA) 40 MG tablet    Sig: Take 1 tablet (40 mg total) by  mouth daily.    Dispense:  30 tablet    Refill:  3    Order Specific Question:   Supervising Provider    Answer:   Smitty CordsKARAMALEGOS, ALEXANDER J [[id number]]  . losartan (COZAAR) 50 MG tablet    Sig: Take 1 tablet (50 mg total) by mouth daily.    Dispense:  90 tablet    Refill:  3    Order Specific Question:   Supervising Provider    Answer:   Smitty CordsKARAMALEGOS, ALEXANDER J [[id number]]  . propranolol ER (INDERAL LA) 120 MG 24 hr capsule    Sig: Take 1 capsule (120 mg total) by mouth daily.    Dispense:  90 capsule    Refill:  1    Order Specific Question:   Supervising Provider    Answer:   Smitty CordsKARAMALEGOS, ALEXANDER J [[id number]]  . omeprazole (PRILOSEC) 20 MG capsule    Sig: Take 1 capsule (20 mg total) by mouth daily.    Dispense:  90 capsule    Refill:  3    Order Specific Question:   Supervising Provider    Answer:   Smitty CordsKARAMALEGOS, ALEXANDER J [[id number]]    Follow up plan: Return in about 3 months (around 06/23/2018) for annual physical AND 6 months for hypertension, anxiety.  Wilhelmina McardleLauren Arabelle Bollig, DNP, AGPCNP-BC Adult Gerontology Primary Care Nurse Practitioner [facility] 03/23/2018, 3:01 PM

## 2018-03-23 NOTE — Patient Instructions (Signed)
Elizabeth CollieLoretta Andrews [name],   Thank you for coming in to clinic today.  1. Continue all medications without changes today.  2. For your tremor: [facility] [address] [place], [place] [address] Phone: [phone]   Please schedule a follow-up appointment with Wilhelmina McardleLauren Kearney Evitt, AGNP. Return in about 3 months (around 06/23/2018) for annual physical AND 6 months for hypertension, anxiety.  If you have any other questions or concerns, please feel free to call the clinic or send a message through MyChart. You may also schedule an earlier appointment if necessary.  You will receive a survey after today's visit either digitally by e-mail or paper by [employer] mail. Your experiences and feedback matter to us.  Please respond so we know how we are doing as we provide care for you.   Wilhelmina McardleLauren Laci Frenkel, DNP, AGNP-BC Adult Gerontology Nurse Practitioner [facility], [facility]

## 2018-03-23 NOTE — Assessment & Plan Note (Signed)
Stable for pt and improved on propranolol ER 120 mg daily.  Is not interfering w/ daily activities.  Plan: 1. Continue propranolol ER at 120 mg daily. 2. Referral to Parkinson's disease.  3. Follow up 3-6 months.

## 2018-03-23 NOTE — Patient Instructions (Signed)
Elizabeth [name] , Thank you for taking time to come for your Medicare Wellness Visit. I appreciate your ongoing commitment to your health goals. Please review the following plan we discussed and let me know if I can assist you in the future.   Screening recommendations/referrals: Colonoscopy: completed 03/04/2013 Mammogram: completed 04/27/2017 Please call [phone] to schedule your mammogram.  Bone Density: completed 04/27/2017 Recommended yearly ophthalmology/optometry visit for glaucoma screening and checkup Recommended yearly dental visit for hygiene and checkup  Vaccinations: Influenza vaccine: due 04/2018 Pneumococcal vaccine: due at age 53 Tdap vaccine: up to date Shingles vaccine: shingrix eligible, check with your insurance company for coverage     Advanced directives: Advance directive discussed with you today. I have provided a copy for you to complete at home and have notarized. Once this is complete please bring a copy in to our office so we can scan it into your chart.  Conditions/risks identified: smoking cessation discussed  Next appointment: Follow up in one year for your annual wellness exam.   Preventive Care 40-64 Years, Female Preventive care refers to lifestyle choices and visits with your health care provider that can promote health and wellness. What does preventive care include?  A yearly physical exam. This is also called an annual well check.  Dental exams once or twice a year.  Routine eye exams. Ask your health care provider how often you should have your eyes checked.  Personal lifestyle choices, including:  Daily care of your teeth and gums.  Regular physical activity.  Eating a healthy diet.  Avoiding tobacco and drug use.  Limiting alcohol use.  Practicing safe sex.  Taking low-dose aspirin daily starting at age 52.  Taking vitamin and mineral supplements as recommended by your health care provider. What happens during an annual well  check? The services and screenings done by your health care provider during your annual well check will depend on your age, overall health, lifestyle risk factors, and family history of disease. Counseling  Your health care provider may ask you questions about your:  Alcohol use.  Tobacco use.  Drug use.  Emotional well-being.  Home and relationship well-being.  Sexual activity.  Eating habits.  Work and work Statistician.  Method of birth control.  Menstrual cycle.  Pregnancy history. Screening  You may have the following tests or measurements:  Height, weight, and BMI.  Blood pressure.  Lipid and cholesterol levels. These may be checked every 5 years, or more frequently if you are over 72 years old.  Skin check.  Lung cancer screening. You may have this screening every year starting at age 62 if you have a 30-pack-year history of smoking and currently smoke or have quit within the past 15 years.  Fecal occult blood test (FOBT) of the stool. You may have this test every year starting at age 68.  Flexible sigmoidoscopy or colonoscopy. You may have a sigmoidoscopy every 5 years or a colonoscopy every 10 years starting at age 35.  Hepatitis C blood test.  Hepatitis B blood test.  Sexually transmitted disease (STD) testing.  Diabetes screening. This is done by checking your blood sugar (glucose) after you have not eaten for a while (fasting). You may have this done every 1-3 years.  Mammogram. This may be done every 1-2 years. Talk to your health care provider about when you should start having regular mammograms. This may depend on whether you have a family history of breast cancer.  BRCA-related cancer screening. This may be done  if you have a family history of breast, ovarian, tubal, or peritoneal cancers.  Pelvic exam and Pap test. This may be done every 3 years starting at age 54. Starting at age 3, this may be done every 5 years if you have a Pap test in  combination with an HPV test.  Bone density scan. This is done to screen for osteoporosis. You may have this scan if you are at high risk for osteoporosis. Discuss your test results, treatment options, and if necessary, the need for more tests with your health care provider. Vaccines  Your health care provider may recommend certain vaccines, such as:  Influenza vaccine. This is recommended every year.  Tetanus, diphtheria, and acellular pertussis (Tdap, Td) vaccine. You may need a Td booster every 10 years.  Zoster vaccine. You may need this after age 34.  Pneumococcal 13-valent conjugate (PCV13) vaccine. You may need this if you have certain conditions and were not previously vaccinated.  Pneumococcal polysaccharide (PPSV23) vaccine. You may need one or two doses if you smoke cigarettes or if you have certain conditions. Talk to your health care provider about which screenings and vaccines you need and how often you need them. This information is not intended to replace advice given to you by your health care provider. Make sure you discuss any questions you have with your health care provider. Document Released: 08/17/2015 Document Revised: 04/09/2016 Document Reviewed: 05/22/2015 Elsevier Interactive Patient Education  2017 [place] Prevention in the Home Falls can cause injuries. They can happen to people of all ages. There are many things you can do to make your home safe and to help prevent falls. What can I do on the outside of my home?  Regularly fix the edges of walkways and driveways and fix any cracks.  Remove anything that might make you trip as you walk through a door, such as a raised step or threshold.  Trim any bushes or trees on the path to your home.  Use bright outdoor lighting.  Clear any walking paths of anything that might make someone trip, such as rocks or tools.  Regularly check to see if handrails are loose or broken. Make sure that both  sides of any steps have handrails.  Any raised decks and porches should have guardrails on the edges.  Have any leaves, snow, or ice cleared regularly.  Use sand or salt on walking paths during winter.  Clean up any spills in your garage right away. This includes oil or grease spills. What can I do in the bathroom?  Use night lights.  Install grab bars by the toilet and in the tub and shower. Do not use towel bars as grab bars.  Use non-skid mats or decals in the tub or shower.  If you need to sit down in the shower, use a plastic, non-slip stool.  Keep the floor dry. Clean up any water that spills on the floor as soon as it happens.  Remove soap buildup in the tub or shower regularly.  Attach bath mats securely with double-sided non-slip rug tape.  Do not have throw rugs and other things on the floor that can make you trip. What can I do in the bedroom?  Use night lights.  Make sure that you have a light by your bed that is easy to reach.  Do not use any sheets or blankets that are too big for your bed. They should not hang down onto the floor.  Have a firm chair that has side arms. You can use this for support while you get dressed.  Do not have throw rugs and other things on the floor that can make you trip. What can I do in the kitchen?  Clean up any spills right away.  Avoid walking on wet floors.  Keep items that you use a lot in easy-to-reach places.  If you need to reach something above you, use a strong step stool that has a grab bar.  Keep electrical cords out of the way.  Do not use floor polish or wax that makes floors slippery. If you must use wax, use non-skid floor wax.  Do not have throw rugs and other things on the floor that can make you trip. What can I do with my stairs?  Do not leave any items on the stairs.  Make sure that there are handrails on both sides of the stairs and use them. Fix handrails that are broken or loose. Make sure that  handrails are as long as the stairways.  Check any carpeting to make sure that it is firmly attached to the stairs. Fix any carpet that is loose or worn.  Avoid having throw rugs at the top or bottom of the stairs. If you do have throw rugs, attach them to the floor with carpet tape.  Make sure that you have a light switch at the top of the stairs and the bottom of the stairs. If you do not have them, ask someone to add them for you. What else can I do to help prevent falls?  Wear shoes that:  Do not have high heels.  Have rubber bottoms.  Are comfortable and fit you well.  Are closed at the toe. Do not wear sandals.  If you use a stepladder:  Make sure that it is fully opened. Do not climb a closed stepladder.  Make sure that both sides of the stepladder are locked into place.  Ask someone to hold it for you, if possible.  Clearly mark and make sure that you can see:  Any grab bars or handrails.  First and last steps.  Where the edge of each step is.  Use tools that help you move around (mobility aids) if they are needed. These include:  Canes.  Walkers.  Scooters.  Crutches.  Turn on the lights when you go into a dark area. Replace any light bulbs as soon as they burn out.  Set up your furniture so you have a clear path. Avoid moving your furniture around.  If any of your floors are uneven, fix them.  If there are any pets around you, be aware of where they are.  Review your medicines with your doctor. Some medicines can make you feel dizzy. This can increase your chance of falling. Ask your doctor what other things that you can do to help prevent falls. This information is not intended to replace advice given to you by your health care provider. Make sure you discuss any questions you have with your health care provider. Document Released: 05/17/2009 Document Revised: 12/27/2015 Document Reviewed: 08/25/2014 Elsevier Interactive Patient Education  2017  [employer].    Steps to Quit Smoking Smoking tobacco can be bad for your health. It can also affect almost every organ in your body. Smoking puts you and people around you at risk for many serious long-lasting (chronic) diseases. Quitting smoking is hard, but it is one of the best things that you can  do for your health. It is never too late to quit. What are the benefits of quitting smoking? When you quit smoking, you lower your risk for getting serious diseases and conditions. They can include:  Lung cancer or lung disease.  Heart disease.  Stroke.  Heart attack.  Not being able to have children (infertility).  Weak bones (osteoporosis) and broken bones (fractures).  If you have coughing, wheezing, and shortness of breath, those symptoms may get better when you quit. You may also get sick less often. If you are pregnant, quitting smoking can help to lower your chances of having a baby of low birth weight. What can I do to help me quit smoking? Talk with your doctor about what can help you quit smoking. Some things you can do (strategies) include:  Quitting smoking totally, instead of slowly cutting back how much you smoke over a period of time.  Going to in-person counseling. You are more likely to quit if you go to many counseling sessions.  Using resources and support systems, such as: ? Database administrator with a Social worker. ? Phone quitlines. ? Careers information officer. ? Support groups or group counseling. ? Text messaging programs. ? Mobile phone apps or applications.  Taking medicines. Some of these medicines may have nicotine in them. If you are pregnant or breastfeeding, do not take any medicines to quit smoking unless your doctor says it is okay. Talk with your doctor about counseling or other things that can help you.  Talk with your doctor about using more than one strategy at the same time, such as taking medicines while you are also going to in-person counseling.  This can help make quitting easier. What things can I do to make it easier to quit? Quitting smoking might feel very hard at first, but there is a lot that you can do to make it easier. Take these steps:  Talk to your family and friends. Ask them to support and encourage you.  Call phone quitlines, reach out to support groups, or work with a Social worker.  Ask people who smoke to not smoke around you.  Avoid places that make you want (trigger) to smoke, such as: ? Bars. ? Parties. ? Smoke-break areas at work.  Spend time with people who do not smoke.  Lower the stress in your life. Stress can make you want to smoke. Try these things to help your stress: ? Getting regular exercise. ? Deep-breathing exercises. ? Yoga. ? Meditating. ? Doing a body scan. To do this, close your eyes, focus on one area of your body at a time from head to toe, and notice which parts of your body are tense. Try to relax the muscles in those areas.  Download or buy apps on your mobile phone or tablet that can help you stick to your quit plan. There are many free apps, such as QuitGuide from the [employer] Office manager for Disease Control and Prevention). You can find more support from smokefree.gov and other websites.  This information is not intended to replace advice given to you by your health care provider. Make sure you discuss any questions you have with your health care provider. Document Released: 05/17/2009 Document Revised: 03/18/2016 Document Reviewed: 12/05/2014 Elsevier Interactive Patient Education  2018 [employer].

## 2018-03-23 NOTE — Assessment & Plan Note (Addendum)
Controlled hypertension today w/ BP at goal < 130/80.  BP also controlled and at goal when checking at home.  She has not increased physical activity.  Taking medications tolerating well without side effects.   Plan: 1. Continue propranolol ER 120 mg once daily.  - Continue losartan 50 mg once daily. 2. Encouraged heart healthy diet and increasing exercise. 3. Check BP 1-2 x per week at home, keep log, and bring to clinic at next appointment. 4. Follow up 6 months.

## 2018-03-23 NOTE — Assessment & Plan Note (Signed)
Stable in past and currently.  Pt continues to feel well and has had more improvement on PHQ9 today in partial remission on medications.  Encouraged pt to use her coping mechanisms for non-pharm stress management.  Continue medication today without changes. Refills provided today.  Followup 6 months.

## 2018-03-23 NOTE — Progress Notes (Signed)
Subjective:   Elizabeth [name] is a 62 y.o. female who presents for Medicare Annual (Subsequent) preventive examination.  Review of Systems:   Cardiac Risk Factors include: advanced age (>44men, >65 women);obesity (BMI >30kg/m2);hypertension;dyslipidemia;smoking/ tobacco exposure     Objective:     Vitals: BP 120/82 (BP Location: Left Arm, Patient Position: Sitting)   Pulse 64   Temp 98.4 F (36.9 C) (Oral)   Resp 16   Ht 4\' 11"  (1.499 m)   Wt 183 lb 3.2 oz (83.1 kg)   BMI 37.00 kg/m   Body mass index is 37 kg/m.  Advanced Directives 03/23/2018 02/17/2017 05/30/2015  Does Patient Have a Medical Advance Directive? No No Yes  Does patient want to make changes to medical advance directive? - - Yes - information given  Would patient like information on creating a medical advance directive? Yes (MAU/Ambulatory/Procedural Areas - Information given) Yes (MAU/Ambulatory/Procedural Areas - Information given) -    Tobacco Social History   Tobacco Use  Smoking Status Current Every Day Smoker  . Packs/day: 0.25  . Years: 40.00  . Pack years: 10.00  . Types: Cigarettes  Smokeless Tobacco Never Used  Tobacco Comment   pt smoking 8 cigs daily     Ready to quit: No Counseling given: Yes Comment: pt smoking 8 cigs daily   Clinical Intake:  Pre-visit preparation completed: Yes  Pain : No/denies pain     Nutritional Status: BMI > 30  Obese Nutritional Risks: None Diabetes: No  How often do you need to have someone help you when you read instructions, pamphlets, or other written materials from your doctor or pharmacy?: 1 - Never What is the last grade level you completed in school?: 12th grade  Interpreter Needed?: No  Information entered by :: Tiffany Hill,LPN   Past Medical History:  Diagnosis Date  . Anxiety   . Depression   . GERD (gastroesophageal reflux disease)   . Insomnia   . Obesity    Past Surgical History:  Procedure Laterality Date  .  CHOLECYSTECTOMY  2004   repeat 2014  . COLONOSCOPY  08/05/2011   Family History  Problem Relation Age of Onset  . Breast cancer Maternal Aunt 70  . Alzheimer's disease Mother   . Stroke Father   . Hypertension Father   . Multiple sclerosis Sister   . Hypertension Sister   . Colon cancer Sister   . Liver cancer Sister   . Breast cancer Cousin        first cousin  . Parkinson's disease Paternal Uncle    Social History   Socioeconomic History  . Marital status: Widowed    Spouse name: Not on file  . Number of children: Not on file  . Years of education: Not on file  . Highest education level: High school graduate  Occupational History  . Not on file  Social Needs  . Financial resource strain: Not hard at all  . Food insecurity:    Worry: Sometimes true    Inability: Never true  . Transportation needs:    Medical: No    Non-medical: No  Tobacco Use  . Smoking status: Current Every Day Smoker    Packs/day: 0.25    Years: 40.00    Pack years: 10.00    Types: Cigarettes  . Smokeless tobacco: Never Used  . Tobacco comment: pt smoking 8 cigs daily  Substance and Sexual Activity  . Alcohol use: No    Comment: about once a month   .  Drug use: No  . Sexual activity: Yes    Birth control/protection: Injection  Lifestyle  . Physical activity:    Days per week: 0 days    Minutes per session: 0 min  . Stress: Not at all  Relationships  . Social connections:    Talks on phone: More than three times a week    Gets together: More than three times a week    Attends religious service: Never    Active member of club or organization: No    Attends meetings of clubs or organizations: Never    Relationship status: Widowed  Other Topics Concern  . Not on file  Social History Narrative  . Not on file    Outpatient Encounter Medications as of 03/23/2018  Medication Sig  . ARIPiprazole (ABILIFY) 5 MG tablet Take 1 tablet (5 mg total) by mouth daily.  Marland Kitchen. aspirin 81 MG tablet  Take 81 mg by mouth daily.  . Cetirizine HCl 10 MG CAPS   . Cholecalciferol (VITAMIN D-3) 1000 units CAPS Take 1 capsule by mouth daily. 2000 iu  . citalopram (CELEXA) 40 MG tablet Take 1 tablet (40 mg total) by mouth daily.  . fluticasone (FLONASE) 50 MCG/ACT nasal spray USE 2 SPRAYS IN EACH  NOSTRIL DAILY  . ibuprofen (ADVIL,MOTRIN) 200 MG tablet Take 200 mg by mouth every 6 (six) hours as needed.  Marland Kitchen. losartan (COZAAR) 50 MG tablet Take 1 tablet (50 mg total) by mouth daily.  . Omega-3 Fatty Acids (FISH OIL) 500 MG CAPS Take 1 capsule (500 mg total) by mouth daily.  Marland Kitchen. omeprazole (PRILOSEC) 20 MG capsule Take 1 capsule (20 mg total) by mouth daily.  . propranolol ER (INDERAL LA) 120 MG 24 hr capsule TAKE 1 CAPSULE BY MOUTH  DAILY  . Blood Pressure Monitoring (SM WRIST CUFF BP MONITOR) MISC 1 Units by Does not apply route daily.  Marland Kitchen. loratadine (ALLERGY RELIEF) 10 MG tablet Take 10 mg by mouth daily.  Marland Kitchen. omega-3 acid ethyl esters (LOVAZA) 1 g capsule Take 1 capsule (1 g total) by mouth 2 (two) times daily.   No facility-administered encounter medications on file as of 03/23/2018.     Activities of Daily Living In your present state of health, do you have any difficulty performing the following activities: 03/23/2018  Hearing? N  Vision? N  Comment reading glasses, contacts   Difficulty concentrating or making decisions? N  Walking or climbing stairs? N  Dressing or bathing? N  Doing errands, shopping? N  Preparing Food and eating ? N  Using the Toilet? N  In the past six months, have you accidently leaked urine? Y  Comment wears pad for protection  Do you have problems with loss of bowel control? N  Managing your Medications? N  Managing your Finances? N  Housekeeping or managing your Housekeeping? N  Some recent data might be hidden    Patient Care Team: Galen ManilaKennedy, Lauren Renee, NP as PCP - General (Nurse Practitioner)    Assessment:   This is a routine wellness examination for  Elizabeth [name].  Exercise Activities and Dietary recommendations Current Exercise Habits: The patient does not participate in regular exercise at present, Exercise limited by: None identified  Goals    . Quit smoking / using tobacco     Smoking cessation discussed    . Weight < 200 lb (90.719 kg)     Pt wants to loose 20 pound in one year.       Fall Risk  Fall Risk  03/23/2018 02/17/2017 10/21/2016 04/29/2016 01/22/2016  Falls in the past year? No No No No No   Is the patient's home free of loose throw rugs in walkways, pet beds, electrical cords, etc?   yes      Grab bars in the bathroom? no      Handrails on the stairs?   yes      Adequate lighting?   yes  Timed Get Up and Go performed: Completed in 8 seconds with no use of assistive devices, steady gait. No intervention needed at this time.   Depression Screen PHQ 2/9 Scores 03/23/2018 12/11/2017 02/17/2017 10/21/2016  PHQ - 2 Score 0 0 0 0  PHQ- 9 Score - 1 - -     Cognitive Function MMSE - Mini Mental State Exam 05/30/2015  Orientation to time 5  Orientation to Place 5  Registration 3  Attention/ Calculation 5  Recall 3  Language- name 2 objects 2  Language- repeat 1  Language- follow 3 step command 3  Language- read & follow direction 1  Write a sentence 1  Copy design 1  Total score 30     6CIT Screen 03/23/2018 02/17/2017  What Year? 0 points 0 points  What month? 0 points 0 points  What time? 0 points 0 points  Count back from 20 0 points 0 points  Months in reverse 0 points 0 points  Repeat phrase 2 points 0 points  Total Score 2 0    Immunization History  Administered Date(s) Administered  . Influenza,inj,Quad PF,6+ Mos 05/30/2015, 04/29/2016, 05/05/2017  . Pneumococcal Polysaccharide-23 02/25/2013  . Tdap 02/25/2013    Qualifies for Shingles Vaccine? Yes, discussed shingrix vaccine   Screening Tests Health Maintenance  Topic Date Due  . INFLUENZA VACCINE  03/04/2018  . MAMMOGRAM  04/28/2019  . PAP  SMEAR  05/05/2020  . TETANUS/TDAP  02/26/2023  . COLONOSCOPY  03/05/2023  . Hepatitis C Screening  Completed  . HIV Screening  Completed    Cancer Screenings: Lung: Low Dose CT Chest recommended if Age 66-80 years, 30 pack-year currently smoking OR have quit w/in 15years. Patient does not qualify. Breast:  Up to date on Mammogram? Yes  04/27/2017 Up to date of Bone Density/Dexa? Yes 04/27/2017 Colorectal: completed 03/04/2016  Additional Screenings:  Hepatitis C Screening: completed 05/30/2015     Plan:    I have personally reviewed and addressed the Medicare Annual Wellness questionnaire and have noted the following in the patient's chart:  A. Medical and social history B. Use of alcohol, tobacco or illicit drugs  C. Current medications and supplements D. Functional ability and status E.  Nutritional status F.  Physical activity G. Advance directives H. List of other physicians I.  Hospitalizations, surgeries, and ER visits in previous 12 months J.  Vitals K. Screenings such as hearing and vision if needed, cognitive and depression L. Referrals and appointments   In addition, I have reviewed and discussed with patient certain preventive protocols, quality metrics, and best practice recommendations. A written personalized care plan for preventive services as well as general preventive health recommendations were provided to patient.   Signed,  Marin Robertsiffany Hill, LPN Nurse Health Advisor   Nurse Notes:none

## 2018-03-23 NOTE — Assessment & Plan Note (Addendum)
Stable.  Pt taking omeprazole 20 mg once daily without breakthrough symptoms.  Plan: 1. Continue omeprazole 20 mg once daily.  Reviewed side effects of short and long-term use. 2. Pt with osteopenia left femur.  Is not taking calcium supplement.  Patient verbalizes understanding of mildly increased risk of worsening bone density due to mild decrease in calcium absorption on PPI 3. Follow up 6 months.

## 2018-03-24 ENCOUNTER — Encounter: Payer: Self-pay | Admitting: Nurse Practitioner

## 2018-03-24 DIAGNOSIS — F419 Anxiety disorder, unspecified: Secondary | ICD-10-CM

## 2018-03-24 DIAGNOSIS — F39 Unspecified mood [affective] disorder: Secondary | ICD-10-CM

## 2018-03-24 MED ORDER — ARIPIPRAZOLE 5 MG PO TABS: 5.0000 mg | ORAL_TABLET | Freq: Every day | ORAL | 6 refills | Status: DC

## 2018-03-24 MED ORDER — ARIPIPRAZOLE 5 MG PO TABS: 5.0000 mg | ORAL_TABLET | Freq: Every day | ORAL | 3 refills | Status: DC

## 2018-03-30 ENCOUNTER — Other Ambulatory Visit: Payer: Self-pay | Admitting: Nurse Practitioner

## 2018-03-30 DIAGNOSIS — K219 Gastro-esophageal reflux disease without esophagitis: Secondary | ICD-10-CM

## 2018-03-30 DIAGNOSIS — F419 Anxiety disorder, unspecified: Secondary | ICD-10-CM

## 2018-03-30 DIAGNOSIS — F39 Unspecified mood [affective] disorder: Secondary | ICD-10-CM

## 2018-03-30 MED ORDER — PANTOPRAZOLE SODIUM 20 MG PO TBEC: 20.0000 mg | DELAYED_RELEASE_TABLET | Freq: Every day | ORAL | 3 refills | Status: DC

## 2018-04-20 ENCOUNTER — Encounter: Payer: Self-pay | Admitting: Nurse Practitioner

## 2018-05-04 ENCOUNTER — Other Ambulatory Visit: Payer: Self-pay | Admitting: Nurse Practitioner

## 2018-05-04 DIAGNOSIS — Z889 Allergy status to unspecified drugs, medicaments and biological substances status: Secondary | ICD-10-CM

## 2018-05-04 DIAGNOSIS — J301 Allergic rhinitis due to pollen: Secondary | ICD-10-CM

## 2018-05-04 IMAGING — MG MM DIGITAL SCREENING BILAT W/ CAD
4 series · 4 of 4 positions shown · non-contrast
Comparison: Previous exam(s).

CLINICAL DATA: Screening.

EXAM:
DIGITAL SCREENING BILATERAL MAMMOGRAM WITH CAD

[R CC]
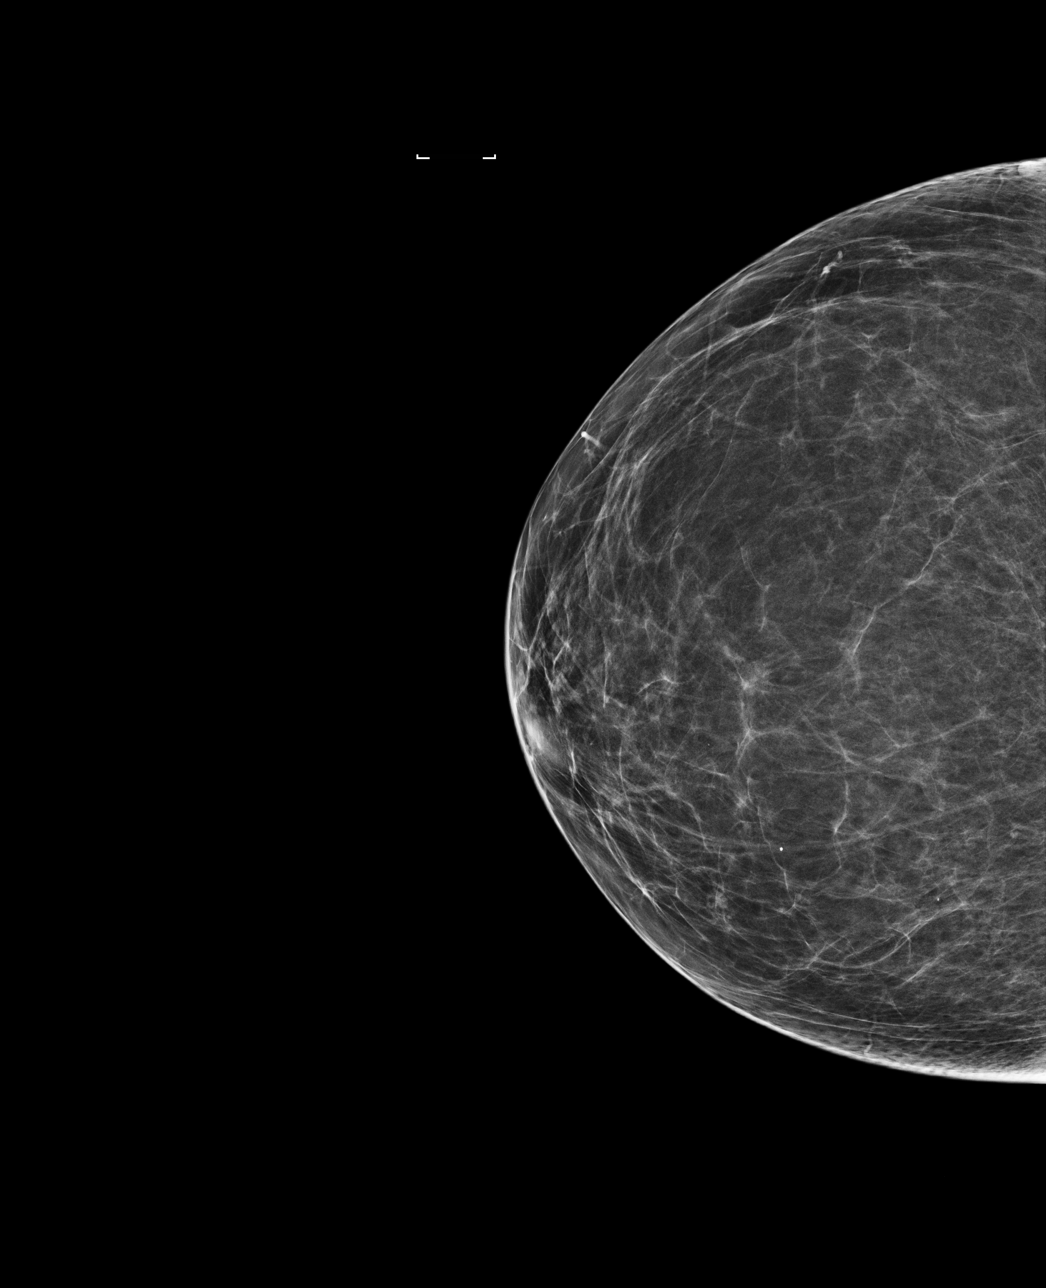

[L CC]
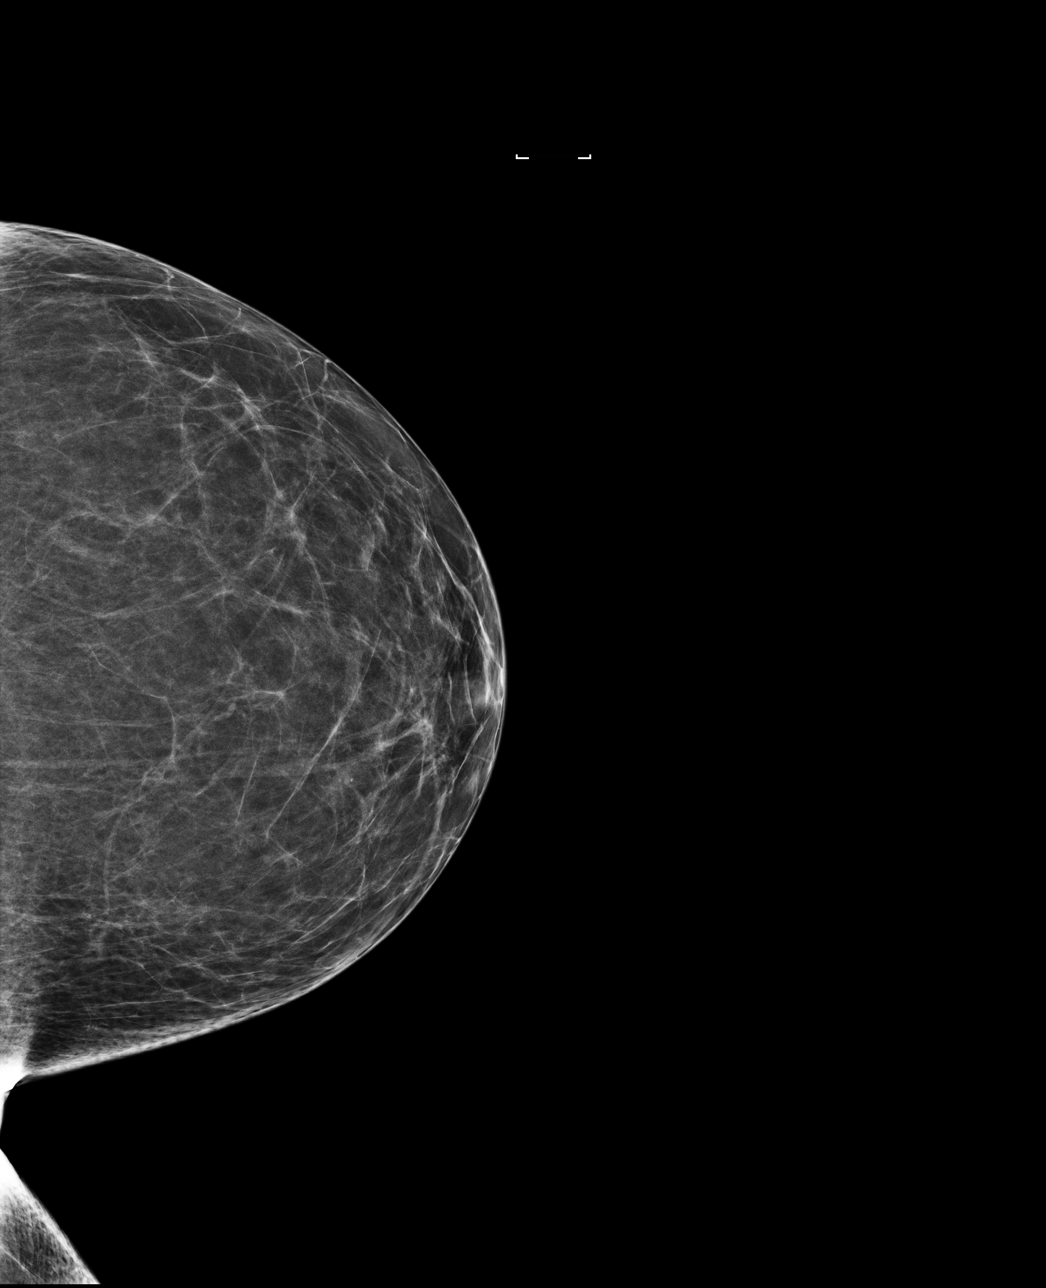

[L MLO]
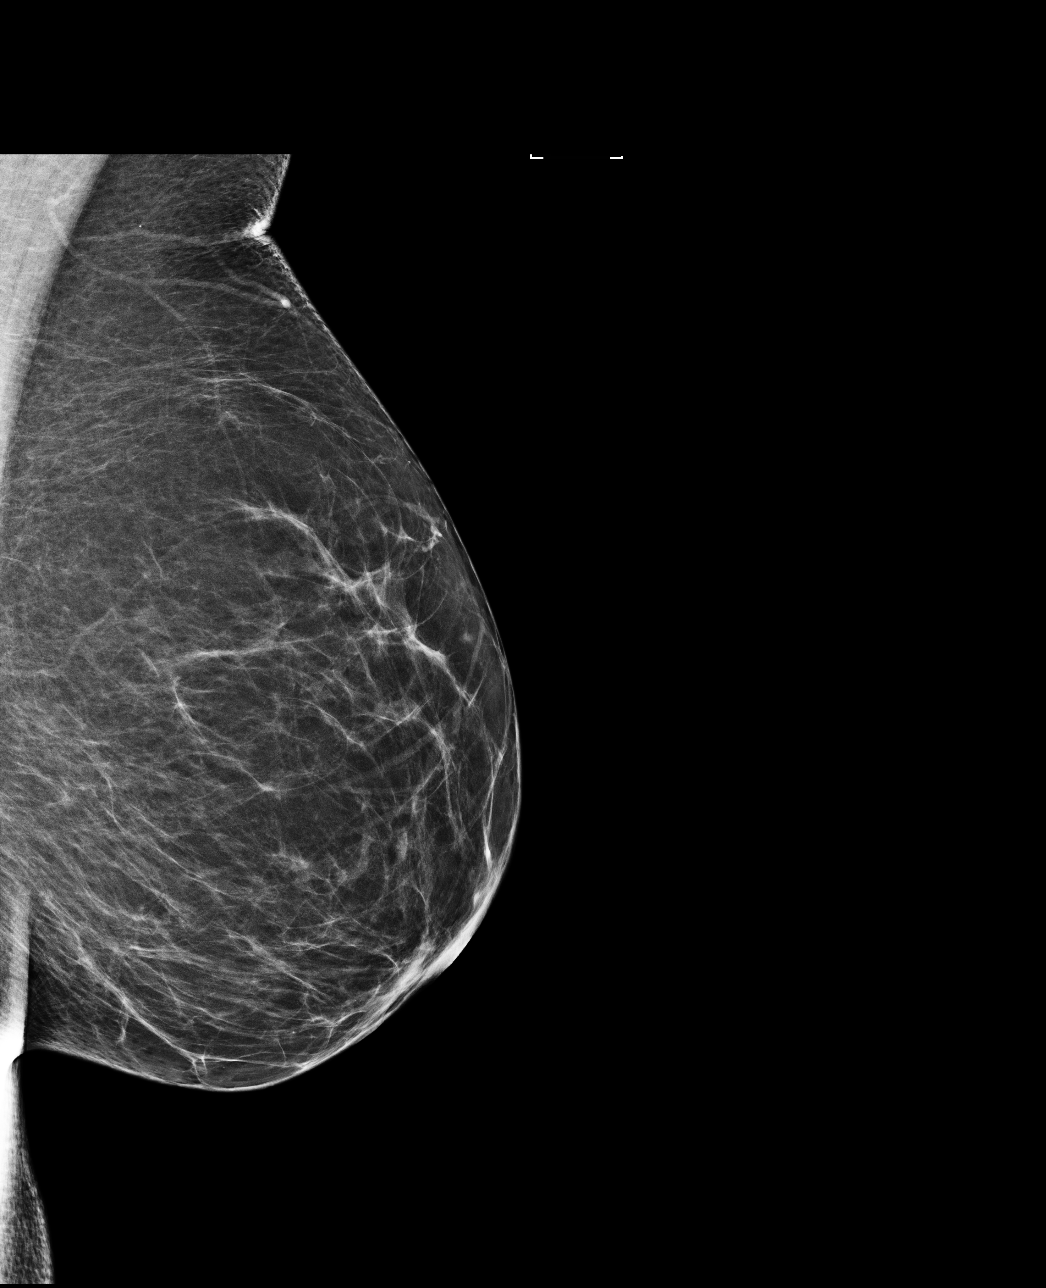

[R MLO]
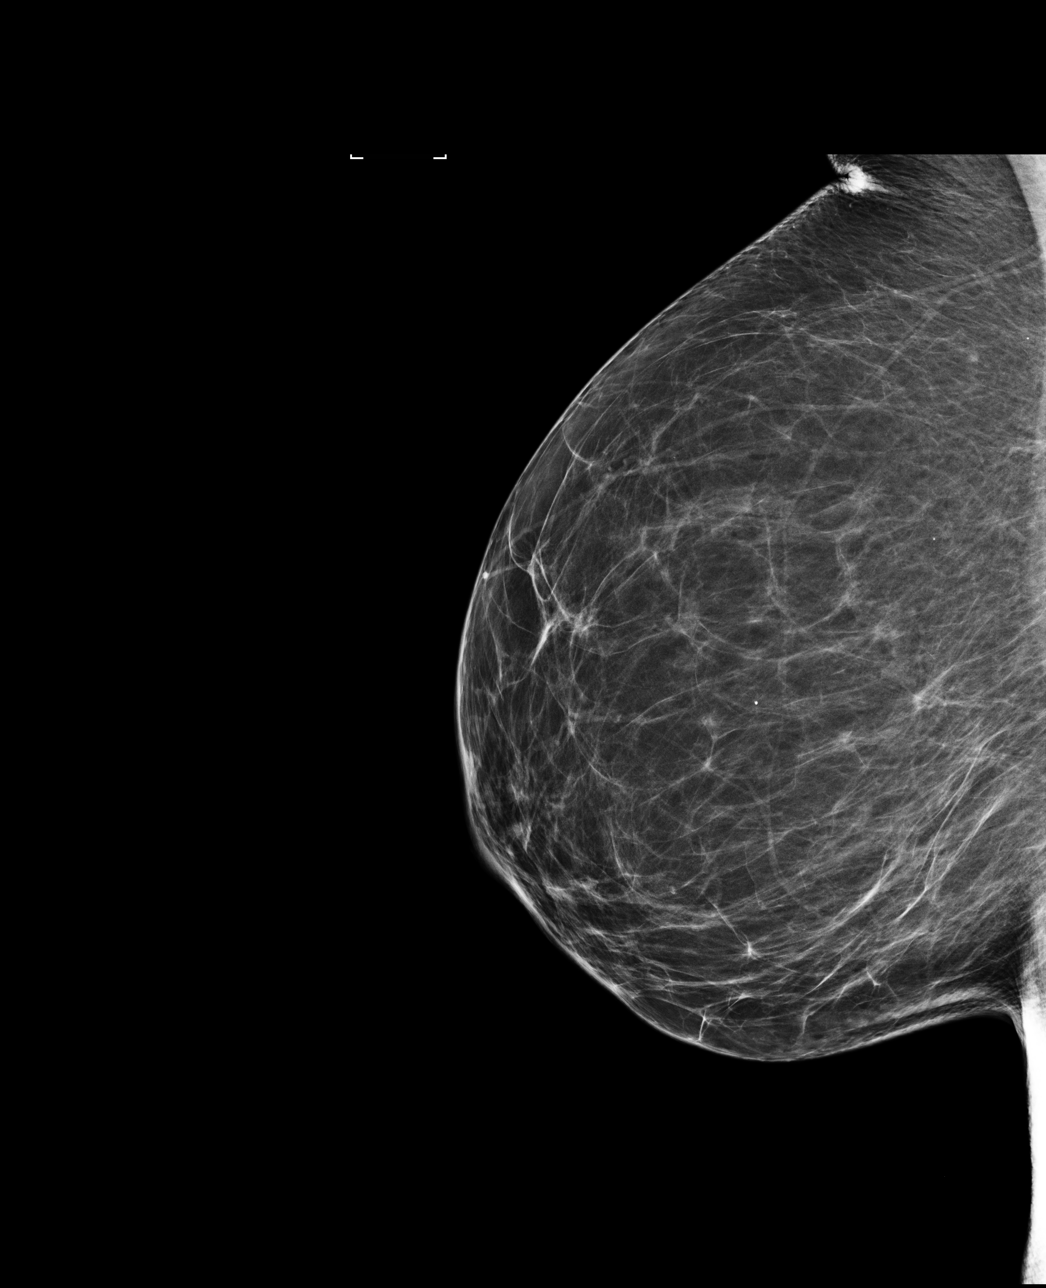

[4 of 4 positions shown; findings below may reference images not displayed]

ACR Breast Density Category b: There are scattered areas of
fibroglandular density.
FINDINGS: There are no findings suspicious for malignancy. Images were
processed with CAD.
IMPRESSION: No mammographic evidence of malignancy. A result letter of this
screening mammogram will be mailed directly to the patient.

RECOMMENDATION:
Screening mammogram in one year. (Code:AS-G-LCT)

BI-RADS CATEGORY  1: Negative.

## 2018-05-04 MED ORDER — FLUTICASONE PROPIONATE 50 MCG/ACT NA SUSP: 2.0000 | Freq: Every day | NASAL | 1 refills | Status: DC

## 2018-05-13 ENCOUNTER — Encounter: Payer: Self-pay | Admitting: Nurse Practitioner

## 2018-05-13 NOTE — Telephone Encounter (Signed)
Mychart message

## 2018-05-26 ENCOUNTER — Ambulatory Visit (INDEPENDENT_AMBULATORY_CARE_PROVIDER_SITE_OTHER): Payer: Medicare Other

## 2018-05-26 DIAGNOSIS — Z23 Encounter for immunization: Secondary | ICD-10-CM | POA: Diagnosis not present

## 2018-06-24 ENCOUNTER — Encounter: Payer: Medicare Other | Admitting: Nurse Practitioner

## 2018-07-06 ENCOUNTER — Encounter: Payer: Medicare Other | Admitting: Nurse Practitioner

## 2018-08-06 ENCOUNTER — Other Ambulatory Visit: Payer: Self-pay | Admitting: Nurse Practitioner

## 2018-08-06 DIAGNOSIS — F39 Unspecified mood [affective] disorder: Secondary | ICD-10-CM

## 2018-08-06 DIAGNOSIS — F419 Anxiety disorder, unspecified: Secondary | ICD-10-CM

## 2018-08-10 DIAGNOSIS — E559 Vitamin D deficiency, unspecified: Secondary | ICD-10-CM | POA: Diagnosis not present

## 2018-08-10 DIAGNOSIS — G2119 Other drug induced secondary parkinsonism: Secondary | ICD-10-CM | POA: Diagnosis not present

## 2018-08-10 DIAGNOSIS — E538 Deficiency of other specified B group vitamins: Secondary | ICD-10-CM | POA: Diagnosis not present

## 2018-08-10 DIAGNOSIS — R0683 Snoring: Secondary | ICD-10-CM | POA: Diagnosis not present

## 2018-08-11 ENCOUNTER — Other Ambulatory Visit: Payer: Self-pay | Admitting: Neurology

## 2018-08-11 ENCOUNTER — Other Ambulatory Visit: Payer: Self-pay | Admitting: Nurse Practitioner

## 2018-08-11 DIAGNOSIS — F39 Unspecified mood [affective] disorder: Secondary | ICD-10-CM

## 2018-08-11 DIAGNOSIS — F419 Anxiety disorder, unspecified: Secondary | ICD-10-CM

## 2018-08-11 DIAGNOSIS — G2119 Other drug induced secondary parkinsonism: Secondary | ICD-10-CM

## 2018-08-13 DIAGNOSIS — G2119 Other drug induced secondary parkinsonism: Secondary | ICD-10-CM | POA: Insufficient documentation

## 2018-08-17 ENCOUNTER — Other Ambulatory Visit: Payer: Self-pay | Admitting: Nurse Practitioner

## 2018-08-17 ENCOUNTER — Encounter: Payer: Self-pay | Admitting: Nurse Practitioner

## 2018-08-17 NOTE — Telephone Encounter (Signed)
Please advise 

## 2018-08-18 ENCOUNTER — Encounter: Payer: Self-pay | Admitting: Nurse Practitioner

## 2018-08-18 NOTE — Telephone Encounter (Signed)
Incoming message

## 2018-08-18 NOTE — Telephone Encounter (Signed)
Mychart message

## 2018-08-21 ENCOUNTER — Ambulatory Visit
Admission: RE | Admit: 2018-08-21 | Discharge: 2018-08-21 | Disposition: A | Discharge status: Home / Self Care | Payer: Medicare Other | Source: Ambulatory Visit | Attending: Neurology | Admitting: Neurology

## 2018-08-21 DIAGNOSIS — R251 Tremor, unspecified: Secondary | ICD-10-CM | POA: Diagnosis not present

## 2018-08-21 DIAGNOSIS — G2119 Other drug induced secondary parkinsonism: Secondary | ICD-10-CM | POA: Diagnosis not present

## 2018-08-31 ENCOUNTER — Other Ambulatory Visit: Payer: Self-pay

## 2018-08-31 ENCOUNTER — Encounter: Payer: Self-pay | Admitting: Nurse Practitioner

## 2018-08-31 ENCOUNTER — Other Ambulatory Visit: Payer: Self-pay | Admitting: Nurse Practitioner

## 2018-08-31 ENCOUNTER — Ambulatory Visit (INDEPENDENT_AMBULATORY_CARE_PROVIDER_SITE_OTHER): Payer: Medicare Other | Admitting: Nurse Practitioner

## 2018-08-31 VITALS — BP 116/69 | HR 59 | Resp 16 | Ht 59.0 in | Wt 182.0 lb

## 2018-08-31 DIAGNOSIS — I10 Essential (primary) hypertension: Secondary | ICD-10-CM

## 2018-08-31 DIAGNOSIS — Z130 Encounter for screening for diseases of the blood and blood-forming organs and certain disorders involving the immune mechanism: Secondary | ICD-10-CM | POA: Diagnosis not present

## 2018-08-31 DIAGNOSIS — F419 Anxiety disorder, unspecified: Secondary | ICD-10-CM

## 2018-08-31 DIAGNOSIS — R259 Unspecified abnormal involuntary movements: Secondary | ICD-10-CM

## 2018-08-31 DIAGNOSIS — Z0000 Encounter for general adult medical examination without abnormal findings: Secondary | ICD-10-CM

## 2018-08-31 DIAGNOSIS — Z13228 Encounter for screening for other metabolic disorders: Secondary | ICD-10-CM | POA: Diagnosis not present

## 2018-08-31 DIAGNOSIS — Z1329 Encounter for screening for other suspected endocrine disorder: Secondary | ICD-10-CM

## 2018-08-31 DIAGNOSIS — F39 Unspecified mood [affective] disorder: Secondary | ICD-10-CM

## 2018-08-31 DIAGNOSIS — K219 Gastro-esophageal reflux disease without esophagitis: Secondary | ICD-10-CM

## 2018-08-31 DIAGNOSIS — G252 Other specified forms of tremor: Secondary | ICD-10-CM

## 2018-08-31 MED ORDER — PROPRANOLOL HCL ER 120 MG PO CP24: 120.0000 mg | ORAL_CAPSULE | Freq: Every day | ORAL | 1 refills | Status: DC

## 2018-08-31 MED ORDER — CITALOPRAM HYDROBROMIDE 40 MG PO TABS: 40.0000 mg | ORAL_TABLET | Freq: Every day | ORAL | 1 refills | Status: DC

## 2018-08-31 MED ORDER — ARIPIPRAZOLE 5 MG PO TABS: 5.0000 mg | ORAL_TABLET | Freq: Every day | ORAL | 1 refills | Status: DC

## 2018-08-31 MED ORDER — LOSARTAN POTASSIUM 50 MG PO TABS: 50.0000 mg | ORAL_TABLET | Freq: Every day | ORAL | 1 refills | Status: DC

## 2018-08-31 NOTE — Patient Instructions (Addendum)
[name],   Thank you for coming in to clinic today.  1. Call nephrology for referral and appointment status. [facility] [address] [place], [place] [address] Phone: [phone]   [address] [place], [place]  [address]  Phone: [phone]  2. Shingrix - Shingles vaccine: 2 doses 2-6 months apart.  [email or web address] for more information if you have questions. This can be given at [facility] pharmacy if your local pharmacy does not have it in stock.  Please call our clinic and we can send the order.  [facility] - Shingrix vaccine [address] [place] [address].   Enter building, walk to end of straight hallway and open door on the RIGHT. [phone]  3. Your mammogram order has been placed.  Call the Scheduling phone number at [phone] to schedule your mammogram at your convenience.  You can choose to go to either location listed below.  Let the scheduler know which location you prefer.  [facility]  [facility]  [address]  [place], [place] [address]   [facility] [address] [place], [place] [address]  Please schedule a follow-up appointment with Wilhelmina Mcardle, AGNP. Return in about 6 months (around 03/01/2019) for hypertension.  If you have any other questions or concerns, please feel free to call the clinic or send a message through MyChart. You may also schedule an earlier appointment if necessary.  You will receive a survey after today's visit either digitally by e-mail or paper by [employer]. Your experiences and feedback matter to [place].  Please respond so we know how we are doing as we provide care for you.   Wilhelmina Mcardle, DNP, AGNP-BC Adult Gerontology Nurse Practitioner [facility], [facility]

## 2018-09-02 ENCOUNTER — Other Ambulatory Visit: Payer: Self-pay | Admitting: Nurse Practitioner

## 2018-09-02 DIAGNOSIS — Z1231 Encounter for screening mammogram for malignant neoplasm of breast: Secondary | ICD-10-CM

## 2018-09-03 NOTE — Progress Notes (Signed)
Subjective:    Patient ID: Elizabeth [name], female    DOB: 1955-11-24, 63 y.o.   MRN: [id number]  Elizabeth [name] is a 63 y.o. female presenting on 08/31/2018 for Annual Exam (will call and schedule mammogram lost the paper with number but order is in the system. )   HPI Annual Physical Exam Patient has been feeling well.  They have no acute concerns today. Has not heard from [facility] for nephrology referral. Sleeps 8-9 hours per night uninterrupted, but still occasionally falls asleep shortly after waking up.  HEALTH MAINTENANCE: Weight/BMI: stable Physical activity: no regular exercise, lives fairly sedentary lifestyle Diet: generally healthy Seatbelt: always Sunscreen: regularly PAP: normal 05/2017 Mammogram: due DEXA: not due Colon Cancer Screen: last in 2014 HIV/HEP C: neg Optometry: not recently Dentistry: not regular - last cleaning about 2007, has two chipped teeth without sensitivity  VACCINES: Tetanus: 2014 Influenza: received this season Shingles: due   Vitamin B12 deficiency ID'd at Dr. Sherryll Burger.  Has not been taking any B12 replacements. Past Medical History:  Diagnosis Date  . Anxiety   . Depression   . GERD (gastroesophageal reflux disease)   . Insomnia   . Obesity    Past Surgical History:  Procedure Laterality Date  . CHOLECYSTECTOMY  2004   repeat 2014  . COLONOSCOPY  08/05/2011   Social History   Socioeconomic History  . Marital status: Widowed    Spouse name: Not on file  . Number of children: Not on file  . Years of education: Not on file  . Highest education level: High school graduate  Occupational History  . Not on file  Social Needs  . Financial resource strain: Not hard at all  . Food insecurity:    Worry: Sometimes true    Inability: Never true  . Transportation needs:    Medical: No    Non-medical: No  Tobacco Use  . Smoking status: Current Every Day Smoker    Packs/day: 0.25    Years: 40.00    Pack years: 10.00      Types: Cigarettes  . Smokeless tobacco: Never Used  . Tobacco comment: pt smoking 8 cigs daily  Substance and Sexual Activity  . Alcohol use: No    Comment: about once a month   . Drug use: No  . Sexual activity: Yes    Birth control/protection: Injection  Lifestyle  . Physical activity:    Days per week: 0 days    Minutes per session: 0 min  . Stress: Not at all  Relationships  . Social connections:    Talks on phone: More than three times a week    Gets together: More than three times a week    Attends religious service: Never    Active member of club or organization: No    Attends meetings of clubs or organizations: Never    Relationship status: Widowed  . Intimate partner violence:    Fear of current or ex partner: No    Emotionally abused: No    Physically abused: No    Forced sexual activity: No  Other Topics Concern  . Not on file  Social History Narrative  . Not on file   Family History  Problem Relation Age of Onset  . Breast cancer Maternal Aunt 70  . Alzheimer's disease Mother   . Stroke Father   . Hypertension Father   . Multiple sclerosis Sister   . Hypertension Sister   . Colon cancer Sister   .  Liver cancer Sister   . Breast cancer Cousin        first cousin  . Parkinson's disease Paternal Uncle    Current Outpatient Medications on File Prior to Visit  Medication Sig  . aspirin 81 MG tablet Take 81 mg by mouth daily.  . Blood Pressure Monitoring (SM WRIST CUFF BP MONITOR) MISC 1 Units by Does not apply route daily.  . Cetirizine HCl 10 MG CAPS   . Cholecalciferol (VITAMIN D-3) 1000 units CAPS Take 1 capsule by mouth daily. 2000 iu  . Omega-3 Fatty Acids (FISH OIL) 500 MG CAPS Take 1 capsule (500 mg total) by mouth daily.  . pantoprazole (PROTONIX) 20 MG tablet Take 1 tablet (20 mg total) by mouth daily.   No current facility-administered medications on file prior to visit.     Review of Systems  Constitutional: Negative for activity  change, appetite change and fatigue.  Respiratory: Negative for cough and shortness of breath.   Cardiovascular: Negative for chest pain, palpitations and leg swelling.  Gastrointestinal: Negative for constipation, diarrhea, nausea and vomiting.  Endocrine: Negative for polydipsia, polyphagia and polyuria.  Genitourinary: Negative for dysuria, frequency and urgency.  Musculoskeletal: Negative for arthralgias and myalgias.  Skin: Negative for rash.  Allergic/Immunologic: Positive for environmental allergies. Negative for food allergies.  Neurological: Positive for tremors (stable). Negative for dizziness and headaches.  Psychiatric/Behavioral: Negative for dysphoric mood, self-injury, sleep disturbance and suicidal ideas. The patient is not nervous/anxious.    Per HPI unless specifically indicated above      Objective:    BP 116/69   Pulse (!) 59   Resp 16   Ht 4\' 11"  (1.499 m)   Wt 182 lb (82.6 kg)   SpO2 99%   BMI 36.76 kg/m   Wt Readings from Last 3 Encounters:  08/31/18 182 lb (82.6 kg)  03/23/18 183 lb 3.2 oz (83.1 kg)  03/23/18 183 lb 3.2 oz (83.1 kg)    Physical Exam Vitals signs and nursing note reviewed.  Constitutional:      General: She is not in acute distress.    Appearance: She is well-developed.  HENT:     Head: Normocephalic and atraumatic.     Right Ear: External ear normal.     Left Ear: External ear normal.     Nose: Nose normal.     Mouth/Throat:     Mouth: Mucous membranes are moist.     Pharynx: Oropharynx is clear.  Eyes:     Conjunctiva/sclera: Conjunctivae normal.     Pupils: Pupils are equal, round, and reactive to light.  Neck:     Musculoskeletal: Normal range of motion and neck supple.     Thyroid: No thyromegaly.     Vascular: No JVD.     Trachea: No tracheal deviation.  Cardiovascular:     Rate and Rhythm: Normal rate and regular rhythm.     Heart sounds: Normal heart sounds. No murmur. No friction rub. No gallop.   Pulmonary:      Effort: Pulmonary effort is normal. No respiratory distress.     Breath sounds: Normal breath sounds.  Chest:     Comments: Breast - Normal exam w/ symmetric breasts, no mass, no nipple discharge, no skin changes or tenderness.  Abdominal:     General: Bowel sounds are normal. There is no distension.     Palpations: Abdomen is soft.     Tenderness: There is no abdominal tenderness.  Musculoskeletal: Normal range of motion.  Lymphadenopathy:     Cervical: No cervical adenopathy.  Skin:    General: Skin is warm and dry.     Capillary Refill: Capillary refill takes less than 2 seconds.  Neurological:     Mental Status: She is alert and oriented to person, place, and time. Mental status is at baseline.     GCS: GCS eye subscore is 4. GCS verbal subscore is 5. GCS motor subscore is 6.     Cranial Nerves: No cranial nerve deficit.     Motor: Tremor (stable) present.  Psychiatric:        Attention and Perception: Attention normal.        Mood and Affect: Mood normal. Affect is flat.        Behavior: Behavior normal.        Thought Content: Thought content normal.        Judgment: Judgment normal.    Results for orders placed or performed in visit on 12/23/17  Basic Metabolic Panel (BMET)  Result Value Ref Range   Glucose, Bld 98 65 - 99 mg/dL   BUN 17 7 - 25 mg/dL   Creat 1.611.27 (H) 0.960.50 - 0.99 mg/dL   BUN/Creatinine Ratio 13 6 - 22 (calc)   Sodium 139 135 - 146 mmol/L   Potassium 4.0 3.5 - 5.3 mmol/L   Chloride 107 98 - 110 mmol/L   CO2 27 20 - 32 mmol/L   Calcium 9.9 8.6 - 10.4 mg/dL      Assessment & Plan:   Problem List Items Addressed This Visit    None    Visit Diagnoses    Encounter for annual physical exam    -  Primary   Screening for endocrine, metabolic and immunity disorder       Relevant Orders   TSH   Lipid panel   Hemoglobin A1c   COMPLETE METABOLIC PANEL WITH GFR   Screening for deficiency anemia       Relevant Orders   CBC with Differential/Platelet      Annual physical exam with no new findings.  Well adult with no acute concerns.  Patient verbally validated with me that medicare covers her physical.  Plan: 1. Obtain health maintenance screenings as above according to age. - Increase physical activity to 30 minutes most days of the week.  - Eat healthy diet high in vegetables and fruits; low in refined carbohydrates. - Labs for screenings as above. 2. Return 6 months for hypertension.  Continue Annual medicare wellness visits.    Follow up plan: Return in about 6 months (around 03/01/2019) for hypertension.  Wilhelmina McardleLauren Pepe Mineau, DNP, AGPCNP-BC Adult Gerontology Primary Care Nurse Practitioner [facility] 09/03/2018, 9:04 AM

## 2018-09-07 ENCOUNTER — Other Ambulatory Visit: Payer: Medicare Other

## 2018-09-07 DIAGNOSIS — Z1329 Encounter for screening for other suspected endocrine disorder: Secondary | ICD-10-CM | POA: Diagnosis not present

## 2018-09-07 DIAGNOSIS — E785 Hyperlipidemia, unspecified: Secondary | ICD-10-CM | POA: Diagnosis not present

## 2018-09-07 DIAGNOSIS — Z130 Encounter for screening for diseases of the blood and blood-forming organs and certain disorders involving the immune mechanism: Secondary | ICD-10-CM | POA: Diagnosis not present

## 2018-09-07 DIAGNOSIS — Z13228 Encounter for screening for other metabolic disorders: Secondary | ICD-10-CM | POA: Diagnosis not present

## 2018-09-07 DIAGNOSIS — Z79899 Other long term (current) drug therapy: Secondary | ICD-10-CM | POA: Diagnosis not present

## 2018-09-08 LAB — HEMOGLOBIN A1C
Hgb A1c MFr Bld: 5.3 % of total Hgb (ref <5.7)
Mean Plasma Glucose: 105 (calc)
eAG (mmol/L): 5.8 (calc)

## 2018-09-08 LAB — COMPLETE METABOLIC PANEL WITH GFR
AG Ratio: 1.6 (calc) (ref 1.0–2.5)
ALT: 10 U/L (ref 6–29)
AST: 14 U/L (ref 10–35)
Albumin: 3.8 g/dL (ref 3.6–5.1)
Alkaline phosphatase (APISO): 64 U/L (ref 37–153)
BUN/Creatinine Ratio: 13 (calc) (ref 6–22)
BUN: 14 mg/dL (ref 7–25)
CO2: 24 mmol/L (ref 20–32)
Calcium: 9.2 mg/dL (ref 8.6–10.4)
Chloride: 105 mmol/L (ref 98–110)
Creat: 1.09 mg/dL — ABNORMAL HIGH (ref 0.50–0.99)
GFR, Est African American: 63 mL/min/{1.73_m2} (ref >=60)
GFR, Est Non African American: 54 mL/min/{1.73_m2} — ABNORMAL LOW (ref >=60)
Globulin: 2.4 g/dL (calc) (ref 1.9–3.7)
Glucose, Bld: 97 mg/dL (ref 65–99)
Potassium: 4.3 mmol/L (ref 3.5–5.3)
Sodium: 136 mmol/L (ref 135–146)
Total Bilirubin: 0.4 mg/dL (ref 0.2–1.2)
Total Protein: 6.2 g/dL (ref 6.1–8.1)

## 2018-09-08 LAB — CBC WITH DIFFERENTIAL/PLATELET
Absolute Monocytes: 464 cells/uL (ref 200–950)
Basophils Absolute: 73 cells/uL (ref 0–200)
Basophils Relative: 1.2 %
Eosinophils Absolute: 232 cells/uL (ref 15–500)
Eosinophils Relative: 3.8 %
HCT: 39.5 % (ref 35.0–45.0)
Hemoglobin: 13.7 g/dL (ref 11.7–15.5)
Lymphs Abs: 1861 cells/uL (ref 850–3900)
MCH: 32.2 pg (ref 27.0–33.0)
MCHC: 34.7 g/dL (ref 32.0–36.0)
MCV: 92.9 fL (ref 80.0–100.0)
MPV: 10.4 fL (ref 7.5–12.5)
Monocytes Relative: 7.6 %
Neutro Abs: 3471 cells/uL (ref 1500–7800)
Neutrophils Relative %: 56.9 %
Platelets: 316 10*3/uL (ref 140–400)
RBC: 4.25 10*6/uL (ref 3.80–5.10)
RDW: 12.5 % (ref 11.0–15.0)
Total Lymphocyte: 30.5 %
WBC: 6.1 10*3/uL (ref 3.8–10.8)

## 2018-09-08 LAB — TSH: TSH: 3.43 mIU/L (ref 0.40–4.50)

## 2018-09-08 LAB — LIPID PANEL
Cholesterol: 224 mg/dL — ABNORMAL HIGH (ref <200)
HDL: 52 mg/dL (ref >=50)
LDL Cholesterol (Calc): 152 mg/dL (calc) — ABNORMAL HIGH
Non-HDL Cholesterol (Calc): 172 mg/dL (calc) — ABNORMAL HIGH (ref <130)
Total CHOL/HDL Ratio: 4.3 (calc) (ref <5.0)
Triglycerides: 93 mg/dL (ref <150)

## 2018-09-21 ENCOUNTER — Ambulatory Visit: Payer: Medicare Other

## 2018-09-22 DIAGNOSIS — H2513 Age-related nuclear cataract, bilateral: Secondary | ICD-10-CM | POA: Diagnosis not present

## 2018-10-20 ENCOUNTER — Encounter: Payer: Self-pay | Admitting: Nurse Practitioner

## 2018-10-24 ENCOUNTER — Other Ambulatory Visit: Payer: Self-pay | Admitting: Nurse Practitioner

## 2018-10-24 DIAGNOSIS — Z889 Allergy status to unspecified drugs, medicaments and biological substances status: Secondary | ICD-10-CM

## 2018-11-08 ENCOUNTER — Other Ambulatory Visit: Payer: Self-pay | Admitting: Nurse Practitioner

## 2018-11-08 DIAGNOSIS — K219 Gastro-esophageal reflux disease without esophagitis: Secondary | ICD-10-CM

## 2018-12-14 ENCOUNTER — Ambulatory Visit: Payer: Medicare Other

## 2018-12-22 ENCOUNTER — Other Ambulatory Visit: Payer: Self-pay | Admitting: Nurse Practitioner

## 2018-12-22 DIAGNOSIS — F39 Unspecified mood [affective] disorder: Secondary | ICD-10-CM

## 2018-12-22 DIAGNOSIS — G252 Other specified forms of tremor: Secondary | ICD-10-CM

## 2018-12-22 DIAGNOSIS — F419 Anxiety disorder, unspecified: Secondary | ICD-10-CM

## 2019-01-10 DIAGNOSIS — G2119 Other drug induced secondary parkinsonism: Secondary | ICD-10-CM | POA: Diagnosis not present

## 2019-02-22 ENCOUNTER — Other Ambulatory Visit: Payer: Self-pay | Admitting: Nurse Practitioner

## 2019-02-22 DIAGNOSIS — I10 Essential (primary) hypertension: Secondary | ICD-10-CM

## 2019-02-22 DIAGNOSIS — F419 Anxiety disorder, unspecified: Secondary | ICD-10-CM

## 2019-02-22 DIAGNOSIS — F39 Unspecified mood [affective] disorder: Secondary | ICD-10-CM

## 2019-03-01 ENCOUNTER — Encounter: Payer: Self-pay | Admitting: Nurse Practitioner

## 2019-03-01 ENCOUNTER — Other Ambulatory Visit: Payer: Self-pay

## 2019-03-01 ENCOUNTER — Other Ambulatory Visit: Payer: Self-pay | Admitting: Nurse Practitioner

## 2019-03-01 ENCOUNTER — Ambulatory Visit (INDEPENDENT_AMBULATORY_CARE_PROVIDER_SITE_OTHER): Payer: Medicare Other | Admitting: Nurse Practitioner

## 2019-03-01 DIAGNOSIS — F419 Anxiety disorder, unspecified: Secondary | ICD-10-CM | POA: Diagnosis not present

## 2019-03-01 DIAGNOSIS — K219 Gastro-esophageal reflux disease without esophagitis: Secondary | ICD-10-CM

## 2019-03-01 DIAGNOSIS — I10 Essential (primary) hypertension: Secondary | ICD-10-CM

## 2019-03-01 DIAGNOSIS — Z79899 Other long term (current) drug therapy: Secondary | ICD-10-CM

## 2019-03-01 DIAGNOSIS — G252 Other specified forms of tremor: Secondary | ICD-10-CM

## 2019-03-01 DIAGNOSIS — F39 Unspecified mood [affective] disorder: Secondary | ICD-10-CM | POA: Diagnosis not present

## 2019-03-01 DIAGNOSIS — R259 Unspecified abnormal involuntary movements: Secondary | ICD-10-CM | POA: Diagnosis not present

## 2019-03-01 DIAGNOSIS — E782 Mixed hyperlipidemia: Secondary | ICD-10-CM

## 2019-03-01 MED ORDER — LOSARTAN POTASSIUM 50 MG PO TABS: 50.0000 mg | ORAL_TABLET | Freq: Every day | ORAL | 1 refills | Status: DC

## 2019-03-01 MED ORDER — PANTOPRAZOLE SODIUM 20 MG PO TBEC: 20.0000 mg | DELAYED_RELEASE_TABLET | Freq: Every day | ORAL | 1 refills | Status: DC

## 2019-03-01 MED ORDER — CITALOPRAM HYDROBROMIDE 40 MG PO TABS: 40.0000 mg | ORAL_TABLET | Freq: Every day | ORAL | 1 refills | Status: DC

## 2019-03-01 MED ORDER — ARIPIPRAZOLE 5 MG PO TABS: 5.0000 mg | ORAL_TABLET | Freq: Every day | ORAL | 1 refills | Status: DC

## 2019-03-01 MED ORDER — PROPRANOLOL HCL ER 120 MG PO CP24: 120.0000 mg | ORAL_CAPSULE | Freq: Every day | ORAL | 1 refills | Status: DC

## 2019-03-01 NOTE — Progress Notes (Signed)
Telemedicine Encounter: Disclosed to patient at start of encounter that we will provide appropriate telemedicine services.  Patient consents to be treated via phone prior to discussion. - Patient is at her home and is accessed via telephone. - Services are provided by Wilhelmina McardleLauren Shakia Sebastiano from [facility].  Subjective:    Patient ID: Elizabeth [name] Courtois, female    DOB: 12/28/1955, 63 y.o.   MRN: [id number]  Elizabeth [name] Hedding is a [age] y.o. female presenting on 03/01/2019 for Hypertension  HPI Hypertension - She is checking BP at home or outside of clinic.  Cuff batteries died this week, but notes her BP 4 days ago was 118/81.  When she took it prior, notes readings are around 120/80s usually. - Current medications: losartan 50 mg once daily, propranolol ER 120 mg once daily, tolerating well without side effects - She is not currently symptomatic. - Pt denies headache, lightheadedness, dizziness, changes in vision, chest tightness/pressure, palpitations, leg swelling, sudden loss of speech or loss of consciousness. - She reports no regular exercise routine. Limited due to heat. - Her diet is moderate in salt, moderate in fat, and moderate in carbohydrates.   GERD Patient has had good control of heartburn symptoms with use of pantoprazole 20 mg once daily and tolerates this well without side effects.  She reports no n/v, coffee ground emesis, dark/black/tarry stool, BRBPR, or other GI bleeding.   Mood Disorder Patient continues on Abilify and Celexa daily.  She reports no significant changes in mood and continues to feel generally well.  [facility] 03/01/2019 08/31/2018 03/23/2018 03/23/2018 12/11/2017  Decreased Interest 0 0 0 0 0  Down, Depressed, Hopeless 0 0 0 0 0  PHQ - 2 Score 0 0 0 0 0  Altered sleeping 1 0 1 - 0  Tired, decreased energy 0 1 1 - 1  Change in appetite 0 0 0 - 0  Feeling bad or failure about yourself  0 0 0 - 0  Trouble concentrating 0 0 0  - 0  Moving slowly or fidgety/restless 0 0 0 - 0  Suicidal thoughts 0 0 0 - 0  PHQ-9 Score 1 1 2  - 1  Difficult doing work/chores Not difficult at all - Not difficult at all - Somewhat difficult     Social History   Tobacco Use  . Smoking status: Current Every Day Smoker    Packs/day: 0.25    Years: 40.00    Pack years: 10.00    Types: Cigarettes  . Smokeless tobacco: Never Used  . Tobacco comment: pt smoking 8 cigs daily  Substance Use Topics  . Alcohol use: No    Comment: about once a month   . Drug use: No    Review of Systems Per HPI unless specifically indicated above     Objective:    There were no vitals taken for this visit.  Wt Readings from Last 3 Encounters:  08/31/18 182 lb (82.6 kg)  03/23/18 183 lb 3.2 oz (83.1 kg)  03/23/18 183 lb 3.2 oz (83.1 kg)    Physical Exam Patient remotely monitored.  Verbal communication appropriate.  Cognition normal.   Results for orders placed or performed in visit on 08/31/18  TSH  Result Value Ref Range   TSH 3.43 0.40 - 4.50 mIU/L  Lipid panel  Result Value Ref Range   Cholesterol 224 (H) <200 mg/dL   HDL 52 > OR = 50 mg/dL   Triglycerides 93 <657<150 mg/dL  LDL Cholesterol (Calc) 152 (H) mg/dL (calc)   Total CHOL/HDL Ratio 4.3 <5.0 (calc)   Non-HDL Cholesterol (Calc) 172 (H) <130 mg/dL (calc)  Hemoglobin [id number]  Result Value Ref Range   Hgb A1c MFr Bld 5.3 <5.7 % of total Hgb   Mean Plasma Glucose 105 (calc)   eAG (mmol/L) 5.8 (calc)  COMPLETE METABOLIC PANEL WITH GFR  Result Value Ref Range   Glucose, Bld 97 65 - 99 mg/dL   BUN 14 7 - 25 mg/dL   Creat 0.961.09 (H) 0.450.50 - 0.99 mg/dL   GFR, Est Non African American 54 (L) > OR = 60 mL/min/1.5673m2   GFR, Est African American 63 > OR = 60 mL/min/1.8273m2   BUN/Creatinine Ratio 13 6 - 22 (calc)   Sodium 136 135 - 146 mmol/L   Potassium 4.3 3.5 - 5.3 mmol/L   Chloride 105 98 - 110 mmol/L   CO2 24 20 - 32 mmol/L   Calcium 9.2 8.6 - 10.4 mg/dL   Total Protein 6.2 6.1 -  8.1 g/dL   Albumin 3.8 3.6 - 5.1 g/dL   Globulin 2.4 1.9 - 3.7 g/dL (calc)   AG Ratio 1.6 1.0 - 2.5 (calc)   Total Bilirubin 0.4 0.2 - 1.2 mg/dL   Alkaline phosphatase (APISO) 64 37 - 153 U/L   AST 14 10 - 35 U/L   ALT 10 6 - 29 U/L  CBC with Differential/Platelet  Result Value Ref Range   WBC 6.1 3.8 - 10.8 Thousand/uL   RBC 4.25 3.80 - 5.10 Million/uL   Hemoglobin 13.7 11.7 - 15.5 g/dL   HCT 40.939.5 81.135.0 - 91.445.0 %   MCV 92.9 80.0 - 100.0 fL   MCH 32.2 27.0 - 33.0 pg   MCHC 34.7 32.0 - 36.0 g/dL   RDW 78.212.5 95.611.0 - 21.315.0 %   Platelets 316 140 - 400 Thousand/uL   MPV 10.4 7.5 - 12.5 fL   Neutro Abs 3,471 1,500 - 7,800 cells/uL   Lymphs Abs 1,861 850 - 3,900 cells/uL   Absolute Monocytes 464 200 - 950 cells/uL   Eosinophils Absolute 232 15 - 500 cells/uL   Basophils Absolute 73 0 - 200 cells/uL   Neutrophils Relative % 56.9 %   Total Lymphocyte 30.5 %   Monocytes Relative 7.6 %   Eosinophils Relative 3.8 %   Basophils Relative 1.2 %      Assessment & Plan:   Problem List Items Addressed This Visit      Cardiovascular and Mediastinum   Hypertension Controlled hypertension.  BP goal < 130/80.  Pt is continuing to work on lifestyle modifications.  Taking medications tolerating well without side effects. No current complications.  Plan: 1. Continue taking medications without changes.  Refills provided 2. Obtain labs at next visit in person  3. Encouraged heart healthy diet and increasing exercise to 30 minutes most days of the week. 4. Check BP 1-2 x per week at home, keep log, and bring to clinic at next appointment. 5. Follow up 6 months.     Relevant Medications   losartan (COZAAR) 50 MG tablet   propranolol ER (INDERAL LA) 120 MG 24 hr capsule     Digestive   GERD (gastroesophageal reflux disease) Currently well controlled on pantoprazole 20 mg once daily.  Plan: 1. Continue pantoprazole 20 mg once daily. Side effects discussed. Pt wants to continue med. 2. Avoid diet  triggers. Reviewed need to seek care if globus sensation, difficulty swallowing, s/sx of GI bleed. 3.  Follow up as needed and in 6 months .    Relevant Medications   pantoprazole (PROTONIX) 20 MG tablet     Other   Mood disorder (HCC) Stable moods without changes in depressive symptoms.  Patient is tolerating Abilify and Celexa without significant side effects.  Abilify is causing tremor, but this is improved at today's visit.  Continue medications without changes.  Refills provided.  Follow-up 6 months.   Relevant Medications   ARIPiprazole (ABILIFY) 5 MG tablet   citalopram (CELEXA) 40 MG tablet   Resting tremor Stable today on exam.  Medications tolerated without side effects.  Continue at current doses.  Refills provided. Followup 6 months.    Relevant Medications   propranolol ER (INDERAL LA) 120 MG 24 hr capsule   Anxiety See Mood disorder above   Relevant Medications   ARIPiprazole (ABILIFY) 5 MG tablet   citalopram (CELEXA) 40 MG tablet      Meds ordered this encounter  Medications  . ARIPiprazole (ABILIFY) 5 MG tablet    Sig: Take 1 tablet (5 mg total) by mouth daily.    Dispense:  90 tablet    Refill:  1    Order Specific Question:   Supervising Provider    Answer:   Olin Hauser [[id number]]  . citalopram (CELEXA) 40 MG tablet    Sig: Take 1 tablet (40 mg total) by mouth daily.    Dispense:  90 tablet    Refill:  1    Order Specific Question:   Supervising Provider    Answer:   Olin Hauser [[id number]]  . losartan (COZAAR) 50 MG tablet    Sig: Take 1 tablet (50 mg total) by mouth daily.    Dispense:  90 tablet    Refill:  1    Order Specific Question:   Supervising Provider    Answer:   Olin Hauser [[id number]]  . pantoprazole (PROTONIX) 20 MG tablet    Sig: Take 1 tablet (20 mg total) by mouth daily.    Dispense:  90 tablet    Refill:  1    Order Specific Question:   Supervising Provider    Answer:   Olin Hauser [[id number]]  .  propranolol ER (INDERAL LA) 120 MG 24 hr capsule    Sig: Take 1 capsule (120 mg total) by mouth daily.    Dispense:  90 capsule    Refill:  1    Order Specific Question:   Supervising Provider    Answer:   Olin Hauser [[id number]]   - Time spent in direct consultation with patient via telemedicine about above concerns: 6 minutes  Follow up plan: Return in about 6 months (around 09/01/2019) for hypertension, GERD, cholesterol.   Cassell Smiles, DNP, AGPCNP-BC Adult Gerontology Primary Care Nurse Practitioner [facility] 03/01/2019, 1:22 PM

## 2019-04-05 ENCOUNTER — Ambulatory Visit (INDEPENDENT_AMBULATORY_CARE_PROVIDER_SITE_OTHER): Payer: Medicare Other

## 2019-04-05 VITALS — BP 127/89 | HR 71

## 2019-04-05 DIAGNOSIS — Z0000 Encounter for general adult medical examination without abnormal findings: Secondary | ICD-10-CM | POA: Diagnosis not present

## 2019-04-05 NOTE — Progress Notes (Signed)
Subjective:   Elizabeth [name] is a 63 y.o. female who presents for Medicare Annual (Subsequent) preventive examination.  This visit is being conducted via phone call  - after an attmept to do on video chat - due to the COVID-19 pandemic. This patient has given me verbal consent via phone to conduct this visit, patient states they are participating from their home address. Some vital signs may be absent or patient reported.   Patient identification: identified by name, DOB, and current address.    Review of Systems:   Cardiac Risk Factors include: dyslipidemia;hypertension;smoking/ tobacco exposure     Objective:     Vitals: BP 127/89 Comment: pt reported  Pulse 71 Comment: pt reported  There is no height or weight on file to calculate BMI.  Advanced Directives 04/05/2019 03/23/2018 02/17/2017 05/30/2015  Does Patient Have a Medical Advance Directive? No No No Yes  Does patient want to make changes to medical advance directive? - - - Yes - information given  Would patient like information on creating a medical advance directive? - Yes (MAU/Ambulatory/Procedural Areas - Information given) Yes (MAU/Ambulatory/Procedural Areas - Information given) -    Tobacco Social History   Tobacco Use  Smoking Status Current Every Day Smoker  . Packs/day: 0.25  . Years: 40.00  . Pack years: 10.00  . Types: Cigarettes  Smokeless Tobacco Never Used  Tobacco Comment   pt smoking 8 cigs daily     Ready to quit: No Counseling given: Not Answered Comment: pt smoking 8 cigs daily   Clinical Intake:  Pre-visit preparation completed: Yes  Pain : No/denies pain     Nutritional Risks: None Diabetes: No  How often do you need to have someone help you when you read instructions, pamphlets, or other written materials from your doctor or pharmacy?: 1 - Never  Interpreter Needed?: No  Information entered by :: Elizabeth Hill,LPN  Past Medical History:  Diagnosis Date  . Allergy  08/04/1998  . Anxiety   . Arthritis 08/04/2008  . Depression   . GERD (gastroesophageal reflux disease)   . Hypertension 11/02/1997  . Insomnia   . Mixed hyperlipidemia   . Obesity    Past Surgical History:  Procedure Laterality Date  . CHOLECYSTECTOMY  2004   repeat 2014  . COLONOSCOPY  08/05/2011   Family History  Problem Relation Age of Onset  . Breast cancer Maternal Aunt 70  . Alzheimer's disease Mother   . Stroke Father   . Hypertension Father   . Multiple sclerosis Sister   . Hypertension Sister   . Early death Sister   . Colon cancer Sister   . Liver cancer Sister   . Breast cancer Cousin        first cousin  . Parkinson's disease Paternal Uncle   . Early death Sister   . Hypertension Sister    Social History   Socioeconomic History  . Marital status: Widowed    Spouse name: Not on file  . Number of children: Not on file  . Years of education: Not on file  . Highest education level: High school graduate  Occupational History  . Not on file  Social Needs  . Financial resource strain: Not hard at all  . Food insecurity    Worry: Sometimes true    Inability: Never true  . Transportation needs    Medical: No    Non-medical: No  Tobacco Use  . Smoking status: Current Every Day Smoker  Packs/day: 0.25    Years: 40.00    Pack years: 10.00    Types: Cigarettes  . Smokeless tobacco: Never Used  . Tobacco comment: pt smoking 8 cigs daily  Substance and Sexual Activity  . Alcohol use: No    Comment: about once a month   . Drug use: No  . Sexual activity: Not Currently    Birth control/protection: Post-menopausal  Lifestyle  . Physical activity    Days per week: 0 days    Minutes per session: 0 min  . Stress: Not at all  Relationships  . Social connections    Talks on phone: More than three times a week    Gets together: More than three times a week    Attends religious service: Never    Active member of club or organization: No    Attends  meetings of clubs or organizations: Never    Relationship status: Widowed  Other Topics Concern  . Not on file  Social History Narrative  . Not on file    Outpatient Encounter Medications as of 04/05/2019  Medication Sig  . ARIPiprazole (ABILIFY) 5 MG tablet Take 1 tablet (5 mg total) by mouth daily.  Marland Kitchen aspirin 81 MG tablet Take 81 mg by mouth daily.  . Blood Pressure Monitoring (SM WRIST CUFF BP MONITOR) MISC 1 Units by Does not apply route daily.  . Cetirizine HCl 10 MG CAPS   . citalopram (CELEXA) 40 MG tablet Take 1 tablet (40 mg total) by mouth daily.  . Cyanocobalamin ([employer] B12 GUMMIES) 500 MCG CHEW Chew 1,000 mg by mouth daily.   . fluticasone (FLONASE) 50 MCG/ACT nasal spray USE 2 SPRAYS IN EACH  NOSTRIL DAILY  . losartan (COZAAR) 50 MG tablet Take 1 tablet (50 mg total) by mouth daily.  . Multiple Vitamin (MULTIVITAMIN) tablet Take 1 tablet by mouth daily.  . pantoprazole (PROTONIX) 20 MG tablet Take 1 tablet (20 mg total) by mouth daily.  . propranolol ER (INDERAL LA) 120 MG 24 hr capsule Take 1 capsule (120 mg total) by mouth daily.  . [DISCONTINUED] Omega-3 Fatty Acids (FISH OIL) 500 MG CAPS Take 1 capsule (500 mg total) by mouth daily. (Patient not taking: Reported on 03/01/2019)   No facility-administered encounter medications on file as of 04/05/2019.     Activities of Daily Living In your present state of health, do you have any difficulty performing the following activities: 04/05/2019 08/31/2018  Hearing? N N  Comment no hearing aids -  [name]? N N  Comment wears glasses, contacts - goes to [facility] [name] -  Difficulty concentrating or making decisions? N N  Walking or climbing stairs? Y N  Comment takes time -  Dressing or bathing? N N  Doing errands, shopping? N N  Preparing Food and eating ? N -  Using the Toilet? N -  In the past six months, have you accidently leaked urine? Y -  Comment wears pads for leakage -  Do you have problems with loss of bowel control? N -   Managing your Medications? N -  Managing your Finances? N -  Housekeeping or managing your Housekeeping? N -  Some recent data might be hidden    Patient Care Team: Galen Manila, NP as PCP - General (Nurse Practitioner)    Assessment:   This is a routine wellness examination for Elizabeth [name].  Exercise Activities and Dietary recommendations Current Exercise Habits: The patient does not participate in regular exercise at present, Exercise  limited by: None identified  Goals    . Quit smoking / using tobacco     Smoking cessation discussed    . Weight < 200 lb (90.719 kg)     Pt wants to loose 20 pound in one year.       Fall Risk: Fall Risk  04/05/2019 08/31/2018 03/23/2018 02/17/2017 10/21/2016  Falls in the past year? 1 0 No No No  Number falls in past yr: 0 0 - - -  Injury with Fall? 1 0 - - -    FALL RISK PREVENTION PERTAINING TO THE HOME:  Any stairs in or around the home? Yes  If so, are there any without handrails? No   Home free of loose throw rugs in walkways, pet beds, electrical cords, etc? No  Adequate lighting in your home to reduce risk of falls? No   ASSISTIVE DEVICES UTILIZED TO PREVENT FALLS:  Life alert? No  Use of a cane, walker or w/c? No  Grab bars in the bathroom? No  Shower chair or bench in shower? No  Elevated toilet seat or a handicapped toilet? No   DME ORDERS:  DME order needed?  No   TIMED UP AND GO:  Unable to perform    Depression Screen PHQ 2/9 Scores 04/05/2019 03/01/2019 08/31/2018 03/23/2018  PHQ - 2 Score 0 0 0 0  PHQ- 9 Score - 1 1 2      Cognitive Function MMSE - Mini Mental State Exam 05/30/2015  Orientation to time 5  Orientation to Place 5  Registration 3  Attention/ Calculation 5  Recall 3  Language- name 2 objects 2  Language- repeat 1  Language- follow 3 step command 3  Language- read & follow direction 1  Write a sentence 1  Copy design 1  Total score 30     6CIT Screen 03/23/2018 02/17/2017  What Year?  0 points 0 points  What month? 0 points 0 points  What time? 0 points 0 points  Count back from 20 0 points 0 points  Months in reverse 0 points 0 points  Repeat phrase 2 points 0 points  Total Score 2 0    Immunization History  Administered Date(s) Administered  . Influenza,inj,Quad PF,6+ Mos 05/30/2015, 04/29/2016, 05/05/2017, 05/26/2018  . Pneumococcal Polysaccharide-23 02/25/2013  . Tdap 02/25/2013    Qualifies for Shingles Vaccine? Yes  Zostavax completed n/a . Due for Shingrix. Education has been provided regarding the importance of this vaccine. Pt has been advised to call insurance company to determine out of pocket expense. Advised may also receive vaccine at local pharmacy or Health Dept. Verbalized acceptance and understanding.  Tdap: up to date   Flu Vaccine: Due now   Pneumococcal Vaccine: not indicated   Screening Tests Health Maintenance  Topic Date Due  . INFLUENZA VACCINE  03/05/2019  . MAMMOGRAM  04/28/2019  . PAP SMEAR-Modifier  05/05/2020  . TETANUS/TDAP  02/26/2023  . COLONOSCOPY  03/05/2023  . Hepatitis C Screening  Completed  . HIV Screening  Completed    Cancer Screenings:  Colorectal Screening: Completed 03/04/2013. Repeat every 10 years  Mammogram: Completed 04/27/2017. Repeat every year; ordered  Bone Density: not indicated   Lung Cancer Screening: (Low Dose CT Chest recommended if Age 28-80 years, 30 pack-year currently smoking OR have quit w/in 15years.) does not qualify.    Additional Screening:  Hepatitis C Screening: does qualify; Completed 05/30/2015  [name] Screening: Recommended annual ophthalmology exams for early detection of glaucoma and other  disorders of the eye. Is the patient up to date with their annual eye exam?  Yes  Who is the provider or what is the name of the office in which the pt attends annual eye exams? [facility] [name]    Dental Screening: Recommended annual dental exams for proper oral hygiene  Community  Resource Referral:  CRR required this visit?  No       Plan:  I have personally reviewed and addressed the Medicare Annual Wellness questionnaire and have noted the following in the patient's chart:  A. Medical and social history B. Use of alcohol, tobacco or illicit drugs  C. Current medications and supplements D. Functional ability and status E.  Nutritional status F.  Physical activity G. Advance directives H. List of other physicians I.  Hospitalizations, surgeries, and ER visits in previous 12 months J.  Vitals K. Screenings such as hearing and [name] if needed, cognitive and depression L. Referrals and appointments   In addition, I have reviewed and discussed with patient certain preventive protocols, quality metrics, and best practice recommendations. A written personalized care plan for preventive services as well as general preventive health recommendations were provided to patient.  Signed,    Collene SchlichterHill, Elizabeth A, LPN  1/6/10969/08/2018 Nurse Health Advisor   Nurse Notes: none

## 2019-04-05 NOTE — Patient Instructions (Signed)
Elizabeth [name] , Thank you for taking time to come for your Medicare Wellness Visit. I appreciate your ongoing commitment to your health goals. Please review the following plan we discussed and let me know if I can assist you in the future.   Screening recommendations/referrals: Colonoscopy: up to date Mammogram: Please call [phone] to schedule your mammogram.  Bone Density: not indicated Recommended yearly ophthalmology/optometry visit for glaucoma screening and checkup Recommended yearly dental visit for hygiene and checkup  Vaccinations: Influenza vaccine: due now Pneumococcal vaccine: not indicated Tdap vaccine: up to date Shingles vaccine: shingrix eligible    Advanced directives: please pick up a copy of this information next time you are in the office   Conditions/risks identified: If you wish to quit smoking, help is available. For free tobacco cessation program offerings call the [facility] at 4453266847 or Live Well Line at (225)400-7738. You may also visit [email or web address] or email [email or web address] for more information on other programs.   Next appointment: follow up in one year for your annual wellness visit.   Preventive Care 40-64 Years, Female Preventive care refers to lifestyle choices and visits with your health care provider that can promote health and wellness. What does preventive care include?  A yearly physical exam. This is also called an annual well check.  Dental exams once or twice a year.  Routine eye exams. Ask your health care provider how often you should have your eyes checked.  Personal lifestyle choices, including:  Daily care of your teeth and gums.  Regular physical activity.  Eating a healthy diet.  Avoiding tobacco and drug use.  Limiting alcohol use.  Practicing safe sex.  Taking low-dose aspirin daily starting at age 63.  Taking vitamin and mineral supplements as recommended by your health care  provider. What happens during an annual well check? The services and screenings done by your health care provider during your annual well check will depend on your age, overall health, lifestyle risk factors, and family history of disease. Counseling  Your health care provider may ask you questions about your:  Alcohol use.  Tobacco use.  Drug use.  Emotional well-being.  Home and relationship well-being.  Sexual activity.  Eating habits.  Work and work Statistician.  Method of birth control.  Menstrual cycle.  Pregnancy history. Screening  You may have the following tests or measurements:  Height, weight, and BMI.  Blood pressure.  Lipid and cholesterol levels. These may be checked every 5 years, or more frequently if you are over 84 years old.  Skin check.  Lung cancer screening. You may have this screening every year starting at age 66 if you have a 30-pack-year history of smoking and currently smoke or have quit within the past 15 years.  Fecal occult blood test (FOBT) of the stool. You may have this test every year starting at age 60.  Flexible sigmoidoscopy or colonoscopy. You may have a sigmoidoscopy every 5 years or a colonoscopy every 10 years starting at age 5.  Hepatitis C blood test.  Hepatitis B blood test.  Sexually transmitted disease (STD) testing.  Diabetes screening. This is done by checking your blood sugar (glucose) after you have not eaten for a while (fasting). You may have this done every 1-3 years.  Mammogram. This may be done every 1-2 years. Talk to your health care provider about when you should start having regular mammograms. This may depend on whether you have a family history of breast cancer.  BRCA-related cancer screening. This may be done if you have a family history of breast, ovarian, tubal, or peritoneal cancers.  Pelvic exam and Pap test. This may be done every 3 years starting at age 45. Starting at age 58, this may be  done every 5 years if you have a Pap test in combination with an HPV test.  Bone density scan. This is done to screen for osteoporosis. You may have this scan if you are at high risk for osteoporosis. Discuss your test results, treatment options, and if necessary, the need for more tests with your health care provider. Vaccines  Your health care provider may recommend certain vaccines, such as:  Influenza vaccine. This is recommended every year.  Tetanus, diphtheria, and acellular pertussis (Tdap, Td) vaccine. You may need a Td booster every 10 years.  Zoster vaccine. You may need this after age 69.  Pneumococcal 13-valent conjugate (PCV13) vaccine. You may need this if you have certain conditions and were not previously vaccinated.  Pneumococcal polysaccharide (PPSV23) vaccine. You may need one or two doses if you smoke cigarettes or if you have certain conditions. Talk to your health care provider about which screenings and vaccines you need and how often you need them. This information is not intended to replace advice given to you by your health care provider. Make sure you discuss any questions you have with your health care provider. Document Released: 08/17/2015 Document Revised: 04/09/2016 Document Reviewed: 05/22/2015 Elsevier Interactive Patient Education  2017 [place] Prevention in the Home Falls can cause injuries. They can happen to people of all ages. There are many things you can do to make your home safe and to help prevent falls. What can I do on the outside of my home?  Regularly fix the edges of walkways and driveways and fix any cracks.  Remove anything that might make you trip as you walk through a door, such as a raised step or threshold.  Trim any bushes or trees on the path to your home.  Use bright outdoor lighting.  Clear any walking paths of anything that might make someone trip, such as rocks or tools.  Regularly check to see if  handrails are loose or broken. Make sure that both sides of any steps have handrails.  Any raised decks and porches should have guardrails on the edges.  Have any leaves, snow, or ice cleared regularly.  Use sand or salt on walking paths during winter.  Clean up any spills in your garage right away. This includes oil or grease spills. What can I do in the bathroom?  Use night lights.  Install grab bars by the toilet and in the tub and shower. Do not use towel bars as grab bars.  Use non-skid mats or decals in the tub or shower.  If you need to sit down in the shower, use a plastic, non-slip stool.  Keep the floor dry. Clean up any water that spills on the floor as soon as it happens.  Remove soap buildup in the tub or shower regularly.  Attach bath mats securely with double-sided non-slip rug tape.  Do not have throw rugs and other things on the floor that can make you trip. What can I do in the bedroom?  Use night lights.  Make sure that you have a light by your bed that is easy to reach.  Do not use any sheets or blankets that are too big for your bed. They  should not hang down onto the floor.  Have a firm chair that has side arms. You can use this for support while you get dressed.  Do not have throw rugs and other things on the floor that can make you trip. What can I do in the kitchen?  Clean up any spills right away.  Avoid walking on wet floors.  Keep items that you use a lot in easy-to-reach places.  If you need to reach something above you, use a strong step stool that has a grab bar.  Keep electrical cords out of the way.  Do not use floor polish or wax that makes floors slippery. If you must use wax, use non-skid floor wax.  Do not have throw rugs and other things on the floor that can make you trip. What can I do with my stairs?  Do not leave any items on the stairs.  Make sure that there are handrails on both sides of the stairs and use them. Fix  handrails that are broken or loose. Make sure that handrails are as long as the stairways.  Check any carpeting to make sure that it is firmly attached to the stairs. Fix any carpet that is loose or worn.  Avoid having throw rugs at the top or bottom of the stairs. If you do have throw rugs, attach them to the floor with carpet tape.  Make sure that you have a light switch at the top of the stairs and the bottom of the stairs. If you do not have them, ask someone to add them for you. What else can I do to help prevent falls?  Wear shoes that:  Do not have high heels.  Have rubber bottoms.  Are comfortable and fit you well.  Are closed at the toe. Do not wear sandals.  If you use a stepladder:  Make sure that it is fully opened. Do not climb a closed stepladder.  Make sure that both sides of the stepladder are locked into place.  Ask someone to hold it for you, if possible.  Clearly mark and make sure that you can see:  Any grab bars or handrails.  First and last steps.  Where the edge of each step is.  Use tools that help you move around (mobility aids) if they are needed. These include:  Canes.  Walkers.  Scooters.  Crutches.  Turn on the lights when you go into a dark area. Replace any light bulbs as soon as they burn out.  Set up your furniture so you have a clear path. Avoid moving your furniture around.  If any of your floors are uneven, fix them.  If there are any pets around you, be aware of where they are.  Review your medicines with your doctor. Some medicines can make you feel dizzy. This can increase your chance of falling. Ask your doctor what other things that you can do to help prevent falls. This information is not intended to replace advice given to you by your health care provider. Make sure you discuss any questions you have with your health care provider. Document Released: 05/17/2009 Document Revised: 12/27/2015 Document Reviewed: 08/25/2014  Elsevier Interactive Patient Education  2017 [employer].

## 2019-05-06 ENCOUNTER — Ambulatory Visit (INDEPENDENT_AMBULATORY_CARE_PROVIDER_SITE_OTHER): Payer: Medicare Other

## 2019-05-06 ENCOUNTER — Other Ambulatory Visit: Payer: Self-pay

## 2019-05-06 DIAGNOSIS — Z23 Encounter for immunization: Secondary | ICD-10-CM

## 2019-07-16 ENCOUNTER — Other Ambulatory Visit: Payer: Self-pay | Admitting: Nurse Practitioner

## 2019-07-16 DIAGNOSIS — K219 Gastro-esophageal reflux disease without esophagitis: Secondary | ICD-10-CM

## 2019-08-04 ENCOUNTER — Other Ambulatory Visit: Payer: Self-pay | Admitting: Nurse Practitioner

## 2019-08-04 DIAGNOSIS — F419 Anxiety disorder, unspecified: Secondary | ICD-10-CM

## 2019-08-04 DIAGNOSIS — I10 Essential (primary) hypertension: Secondary | ICD-10-CM

## 2019-08-04 DIAGNOSIS — G252 Other specified forms of tremor: Secondary | ICD-10-CM

## 2019-08-04 DIAGNOSIS — F39 Unspecified mood [affective] disorder: Secondary | ICD-10-CM

## 2019-08-09 ENCOUNTER — Ambulatory Visit
Admission: RE | Admit: 2019-08-09 | Discharge: 2019-08-09 | Disposition: A | Discharge status: Home / Self Care | Payer: Medicare Other | Source: Ambulatory Visit | Attending: Nurse Practitioner | Admitting: Nurse Practitioner

## 2019-08-09 ENCOUNTER — Other Ambulatory Visit: Payer: Self-pay

## 2019-08-09 DIAGNOSIS — Z1231 Encounter for screening mammogram for malignant neoplasm of breast: Secondary | ICD-10-CM | POA: Diagnosis not present

## 2019-08-10 ENCOUNTER — Other Ambulatory Visit: Payer: Self-pay

## 2019-08-10 ENCOUNTER — Other Ambulatory Visit: Payer: Self-pay | Admitting: Family Medicine

## 2019-08-10 ENCOUNTER — Other Ambulatory Visit: Payer: Self-pay | Admitting: Nurse Practitioner

## 2019-08-10 DIAGNOSIS — N6310 Unspecified lump in the right breast, unspecified quadrant: Secondary | ICD-10-CM

## 2019-08-10 DIAGNOSIS — R928 Other abnormal and inconclusive findings on diagnostic imaging of breast: Secondary | ICD-10-CM

## 2019-08-23 ENCOUNTER — Ambulatory Visit
Admission: RE | Admit: 2019-08-23 | Discharge: 2019-08-23 | Disposition: A | Discharge status: Home / Self Care | Payer: Medicare Other | Source: Ambulatory Visit | Attending: Family Medicine | Admitting: Family Medicine

## 2019-08-23 DIAGNOSIS — R928 Other abnormal and inconclusive findings on diagnostic imaging of breast: Secondary | ICD-10-CM

## 2019-08-23 DIAGNOSIS — N6310 Unspecified lump in the right breast, unspecified quadrant: Secondary | ICD-10-CM | POA: Diagnosis not present

## 2019-09-01 ENCOUNTER — Ambulatory Visit: Payer: Medicare Other | Admitting: Nurse Practitioner

## 2019-09-27 ENCOUNTER — Telehealth: Payer: Self-pay | Admitting: Nurse Practitioner

## 2019-09-27 NOTE — Chronic Care Management (AMB) (Signed)
  Chronic Care Management   Outreach Note  09/27/2019 Name: Elizabeth [name] MRN: [id number] DOB: [birth year]  Elizabeth [name] is a 64 y.o. year old female who is a primary care patient of Galen Manila, NP (Inactive). I reached out to [name] by phone today in response to a referral sent by [name]'s health plan.     An unsuccessful telephone outreach was attempted today. The patient was referred to the case management team for assistance with care management and care coordination.   Follow Up Plan: A HIPPA compliant phone message was left for the patient providing contact information and requesting a return call.  The care management team will reach out to the patient again over the next 7 days.  If patient returns call to provider office, please advise to call Embedded Care Management Care Guide Penne Lash  at [phone]  Penne Lash, RMA Care Guide, Embedded Care Coordination [facility]  [place], [place] [address] Direct Dial: [phone] [email or web address] Website: [email or web address]

## 2019-09-30 ENCOUNTER — Other Ambulatory Visit: Payer: Self-pay

## 2019-09-30 ENCOUNTER — Encounter: Payer: Self-pay | Admitting: Family Medicine

## 2019-09-30 ENCOUNTER — Ambulatory Visit (INDEPENDENT_AMBULATORY_CARE_PROVIDER_SITE_OTHER): Payer: Medicare Other | Admitting: Family Medicine

## 2019-09-30 VITALS — BP 114/69 | HR 60 | Temp 97.7°F | Ht 59.0 in | Wt 193.1 lb

## 2019-09-30 DIAGNOSIS — E782 Mixed hyperlipidemia: Secondary | ICD-10-CM

## 2019-09-30 DIAGNOSIS — M545 Low back pain, unspecified: Secondary | ICD-10-CM

## 2019-09-30 DIAGNOSIS — K219 Gastro-esophageal reflux disease without esophagitis: Secondary | ICD-10-CM | POA: Diagnosis not present

## 2019-09-30 DIAGNOSIS — M62838 Other muscle spasm: Secondary | ICD-10-CM

## 2019-09-30 DIAGNOSIS — I10 Essential (primary) hypertension: Secondary | ICD-10-CM | POA: Diagnosis not present

## 2019-09-30 DIAGNOSIS — Z889 Allergy status to unspecified drugs, medicaments and biological substances status: Secondary | ICD-10-CM | POA: Diagnosis not present

## 2019-09-30 DIAGNOSIS — G2119 Other drug induced secondary parkinsonism: Secondary | ICD-10-CM

## 2019-09-30 DIAGNOSIS — G8929 Other chronic pain: Secondary | ICD-10-CM | POA: Insufficient documentation

## 2019-09-30 DIAGNOSIS — F419 Anxiety disorder, unspecified: Secondary | ICD-10-CM

## 2019-09-30 DIAGNOSIS — F39 Unspecified mood [affective] disorder: Secondary | ICD-10-CM

## 2019-09-30 DIAGNOSIS — E669 Obesity, unspecified: Secondary | ICD-10-CM

## 2019-09-30 DIAGNOSIS — G252 Other specified forms of tremor: Secondary | ICD-10-CM

## 2019-09-30 MED ORDER — ARIPIPRAZOLE 5 MG PO TABS: 5.0000 mg | ORAL_TABLET | Freq: Every day | ORAL | 1 refills | Status: DC

## 2019-09-30 MED ORDER — CITALOPRAM HYDROBROMIDE 40 MG PO TABS: 40.0000 mg | ORAL_TABLET | Freq: Every day | ORAL | 3 refills | Status: DC

## 2019-09-30 MED ORDER — PROPRANOLOL HCL ER 120 MG PO CP24: 120.0000 mg | ORAL_CAPSULE | Freq: Every day | ORAL | 3 refills | Status: DC

## 2019-09-30 MED ORDER — CETIRIZINE HCL 10 MG PO TABS: 10.0000 mg | ORAL_TABLET | Freq: Every day | ORAL | 11 refills | Status: DC

## 2019-09-30 MED ORDER — PANTOPRAZOLE SODIUM 20 MG PO TBEC: 20.0000 mg | DELAYED_RELEASE_TABLET | Freq: Every day | ORAL | 3 refills | Status: DC

## 2019-09-30 MED ORDER — BACLOFEN 5 MG PO TABS: 1.0000 | ORAL_TABLET | Freq: Three times a day (TID) | ORAL | 0 refills | Status: DC | PRN

## 2019-09-30 MED ORDER — FLUTICASONE PROPIONATE 50 MCG/ACT NA SUSP: 2.0000 | Freq: Every day | NASAL | 1 refills | Status: DC

## 2019-09-30 MED ORDER — LOSARTAN POTASSIUM 50 MG PO TABS: 50.0000 mg | ORAL_TABLET | Freq: Every day | ORAL | 3 refills | Status: DC

## 2019-09-30 NOTE — Assessment & Plan Note (Signed)
Has had chronic lower back pain with exacerbation for 4 months.  Has been taking Acetaminophen without relief or change in symptoms. Without radiculopathy, with small spasm in right paraspinal lumbar.  Likely muscular strain  Plan: 1) Referral to physical therapy placed 2) Baclofen 5mg  by mouth every 8 hours as needed for pain 3) Contact our office if back pain progresses or not improving after meeting with physical therapy.

## 2019-09-30 NOTE — Patient Instructions (Addendum)
I have sent in refills on your prescriptions and started you on Baclofen for low back pain to take every 8 hours as needed.  Have your lab work completed and we will contact you with the results once received.  1. For your Back Pain - I think that this is due to Muscle Spasms or strain. 2. Start with anti-inflammatory Naprosyn (Naproxen) 500mg  twice daily (12 hrs apart, with food, breakfast and dinner) every day for next 2 to 4 weeks if helping, then can use only as needed 3. Start Baclofen (Lioresal) may make you sedated or sleepy (be careful driving or working on this) if tolerated you can take every 8 hours, half or whole tab 4. May use Tylenol Extra Str 500mg  tabs - may take 1-2 tablets every 6 hours as needed 5. Recommend to start using heating pad on your lower back 1-2x daily for few weeks  Also try a Wedge Seat Cushion to avoid nerve pinching when sitting prolonged period of time.  This pain may take weeks to months to fully resolve, but hopefully it will respond to the medicine initially. All back injuries (small or serious) are slow to heal since we use our back muscles every day. Be careful with turning, twisting, lifting, sitting / standing for prolonged periods, and avoid re-injury.  If your symptoms significantly worsen with more pain, or new symptoms with weakness in one or both legs, new or different shooting leg pains, numbness in legs or groin, loss of control or retention of urine or bowel movements, please call back for advice and you may need to go directly to the Emergency Department.  Having high HDL (good) cholesterol and low triglyceride levels can reduce your risk of heart attack and stroke.  The following steps can be taken to reduce triglyceride levels and increase HDL (good) cholesterol levels.  Avoid or limit alcohol Alcohol significantly raises triglyceride levels.  If your triglycerides are higher than 150, avoid or limit alcohol.  Limit fruit juice and dried  fruits Even fresh fruit juice contains a large amount of fructose sugar and can increase triglyceride and blood sugar levels.  Fruit juice and dried fruit are also high in calories and can add unwanted pounds.  Replace fruit juice and dried fruits with fresh fruit that has more fiber and fewer calories.  Limit non-diet soft drinks and sports drinks Non-diet soft drinks and sport drinks contain as many as 12 teaspoons of sugar per serving.  This sugar can increase triglyceride and blood sugar levels.  Non-diet soft drinks and sport drinks also contain calories that add unwanted pounds.  Replace non-diet soft drinks and sports drinks with water, unsweetened tea, diet colas or beverages sweetened with Nutra-Sweet.  Limit concentrated sweets Even fat free or low fat sweets can raise your triglyceride and/or blood sugar levels.  Concentrated sweets include sugar, honey, jelly, candy, cookies, cakes, pies, pastries, frozen desserts, puddings, and sugar sweetened cereals.  Replace concentrated sweets with high fiber foods such as fruit, low fat yogurt, sugar free gelatin and low fat puddings.  Limit refined carbohydrates Refined carbohydrates include white flour, white bread, white rice, and some pasta.  Limit portion sizes of these foods or replace them with whole wheat bread, lentils, whole grains, brown rice, and spinach or whole-wheat pastas.  Reduce your weight, if needed Even a 5-10 pound weight loss can cause your triglyceride level to decrease significantly.  You can lose 1-2 pounds per week by limiting your portion sizes and exercising 5-6  times per week.  Include monounsaturated fats in your diet.  Include Monounsaturated fats in your diet Moderate amounts of monounsaturated fat can raise your HDL (good) cholesterol.  Sources of monounsaturated fat include olive oil, canola oil, peanut oil, peanuts, peanut butter, cashews, olives, and avocados.  Choose peanut butter that does not have sugar in the  ingredient list.  Since these foods are high in calories, serving sizes should be moderate.  Include fish in your diet Fish is low in saturated fat and rich in Omega-3 fatty acids.  Good choices include mackerel, salmon, herring, bluefish, lake trout, tuna, and sardines canned in oil.  If you smoke, quit Smoking lowers HDL (good) cholesterol and raises triglycerides.  When you quit smoking your HDL (good) cholesterol increases and triglycerides decrease.  Stay active Exercise raises your HDL (good) cholesterol.  Walk, ride a bike, or swim for at least 20 minutes, 3-5 times per week.  Ideally, you should try to exercise for 30-60 minutes most days of the week.  Check with your Healthcare Provider before beginning an exercise program.  You will receive a survey after today's visit either digitally by e-mail or paper by [employer]. Your experiences and feedback matter to [place].  Please respond so we know how we are doing as we provide care for you.  Call us with any questions/concerns/needs.  It is my goal to be available to you for your health concerns.  Thanks for choosing me to be a partner in your healthcare needs!  Charlaine Dalton, FNP-C Family Nurse Practitioner [facility] Phone: [phone]

## 2019-09-30 NOTE — Assessment & Plan Note (Signed)
Stable and well controlled on current medication regimen of losartan 50mg  daily and propranolol ER 120mg  daily.  Tolerating it well.   Plan: 1) Labs to be drawn within the next 1-2 weeks and will call with results 2) Continue Losartan 50mg  daily and Propranolol ER 120mg  daily  3) Take blood pressure readings regularly and write in a log.  Bring that log to your next appointment. 4) Heart healthy diet and to exercise every other day for 30 minutes per day, going no more than 2 days in a row without exercise. 5) We will see you back in October for your physical

## 2019-09-30 NOTE — Progress Notes (Signed)
Subjective:    Patient ID: Elizabeth [name], female    DOB: 10-21-1955, 64 y.o.   MRN: [id number]  Elizabeth [name] is a 64 y.o. female presenting on 09/30/2019 for Hypertension   HPI  Has been taking her medications and tolerating fairly well.  Has had some low back pain x 4 months that has been exacerbated by vacuuming, dishes, standing.  Has been taking Acetaminophen without any relief in symptoms.  Denies any change in bowel/bladder function, saddle anesthesia, numbness, tingling, weakness, difficulty with ambulation, or sleep disturbances.  Depression screen [facility] 2/9 09/30/2019 04/05/2019 03/01/2019  Decreased Interest 0 0 0  Down, Depressed, Hopeless 0 0 0  PHQ - 2 Score 0 0 0  Altered sleeping - - 1  Tired, decreased energy - - 0  Change in appetite - - 0  Feeling bad or failure about yourself  - - 0  Trouble concentrating - - 0  Moving slowly or fidgety/restless - - 0  Suicidal thoughts - - 0  PHQ-9 Score - - 1  Difficult doing work/chores - - Not difficult at all    Social History   Tobacco Use  . Smoking status: Current Every Day Smoker    Packs/day: 0.25    Years: 40.00    Pack years: 10.00    Types: Cigarettes  . Smokeless tobacco: Never Used  . Tobacco comment: pt smoking 8 cigs daily  Substance Use Topics  . Alcohol use: No    Comment: about once a month   . Drug use: No    Review of Systems  Constitutional: Negative.   HENT: Negative.   Eyes: Negative.   Respiratory: Negative.   Cardiovascular: Negative.   Gastrointestinal: Negative.   Endocrine: Negative.   Genitourinary: Negative.   Musculoskeletal: Positive for back pain. Negative for gait problem, joint swelling, myalgias, neck pain and neck stiffness.  Skin: Negative.   Allergic/Immunologic: Negative.   Neurological: Positive for tremors. Negative for dizziness, seizures, syncope, facial asymmetry, speech difficulty, weakness, light-headedness, numbness and headaches.  Hematological:  Negative.   Psychiatric/Behavioral: Negative.    Per HPI unless specifically indicated above     Objective:    BP 114/69 (BP Location: Left Arm, Patient Position: Sitting)   Pulse 60   Temp 97.7 F (36.5 C) (Temporal)   Ht 4\' 11"  (1.499 m)   Wt 193 lb 1.6 oz (87.6 kg)   BMI 39.00 kg/m   Wt Readings from Last 3 Encounters:  09/30/19 193 lb 1.6 oz (87.6 kg)  08/31/18 182 lb (82.6 kg)  03/23/18 183 lb 3.2 oz (83.1 kg)    Physical Exam Vitals reviewed.  Constitutional:      General: She is not in acute distress.    Appearance: Normal appearance. She is well-groomed. She is obese. She is not ill-appearing or toxic-appearing.  HENT:     Head: Normocephalic.  Eyes:     General: Lids are normal. Vision grossly intact. No scleral icterus.       Right eye: No discharge.        Left eye: No discharge.     Extraocular Movements: Extraocular movements intact.     Pupils: Pupils are equal, round, and reactive to light.  Cardiovascular:     Rate and Rhythm: Normal rate and regular rhythm.     Pulses: Normal pulses.          Dorsalis pedis pulses are 2+ on the right side and 2+ on the left side.  Posterior tibial pulses are 2+ on the right side and 2+ on the left side.     Heart sounds: Normal heart sounds. No murmur. No friction rub. No gallop.   Pulmonary:     Effort: Pulmonary effort is normal. No respiratory distress.     Breath sounds: Normal breath sounds.  Abdominal:     General: Abdomen is flat. Bowel sounds are normal. There is no distension.     Palpations: Abdomen is soft. There is no hepatomegaly, splenomegaly or mass.     Tenderness: There is no abdominal tenderness. There is no guarding or rebound.     Hernia: No hernia is present.  Musculoskeletal:        General: Tenderness (paraspinal lumbar/sacral tenderness bilaterally with palpation.  Mild spasm noted right lumbar paraspinal) present. No swelling.     Cervical back: Normal.     Thoracic back: Normal.      Lumbar back: Spasms and tenderness present. No swelling, edema, deformity, signs of trauma, lacerations or bony tenderness. Negative right straight leg raise test and negative left straight leg raise test. No scoliosis.     Right lower leg: No edema.     Left lower leg: No edema.  Feet:     Right foot:     Skin integrity: Skin integrity normal.     Left foot:     Skin integrity: Skin integrity normal.  Skin:    General: Skin is warm and dry.     Capillary Refill: Capillary refill takes less than 2 seconds.  Neurological:     General: No focal deficit present.     Mental Status: She is alert and oriented to person, place, and time.     Cranial Nerves: Cranial nerves are intact. No cranial nerve deficit.     Sensory: Sensation is intact.     Motor: Tremor (Right hand) present.     Gait: Gait is intact.  Psychiatric:        Attention and Perception: Attention and perception normal.        Mood and Affect: Mood normal.        Speech: Speech normal.        Behavior: Behavior normal. Behavior is cooperative.        Thought Content: Thought content normal.        Cognition and Memory: Cognition and memory normal.        Judgment: Judgment normal.     Results for orders placed or performed in visit on 08/31/18  TSH  Result Value Ref Range   TSH 3.43 0.40 - 4.50 mIU/L  Lipid panel  Result Value Ref Range   Cholesterol 224 (H) <200 mg/dL   HDL 52 > OR = 50 mg/dL   Triglycerides 93 <702 mg/dL   LDL Cholesterol (Calc) 152 (H) mg/dL (calc)   Total CHOL/HDL Ratio 4.3 <5.0 (calc)   Non-HDL Cholesterol (Calc) 172 (H) <130 mg/dL (calc)  Hemoglobin O3Z  Result Value Ref Range   Hgb A1c MFr Bld 5.3 <5.7 % of total Hgb   Mean Plasma Glucose 105 (calc)   eAG (mmol/L) 5.8 (calc)  COMPLETE METABOLIC PANEL WITH GFR  Result Value Ref Range   Glucose, Bld 97 65 - 99 mg/dL   BUN 14 7 - 25 mg/dL   Creat 8.58 (H) 8.50 - 0.99 mg/dL   GFR, Est Non African American 54 (L) > OR = 60 mL/min/1.44m2     GFR, Est African American 63 > OR =  60 mL/min/1.57m2   BUN/Creatinine Ratio 13 6 - 22 (calc)   Sodium 136 135 - 146 mmol/L   Potassium 4.3 3.5 - 5.3 mmol/L   Chloride 105 98 - 110 mmol/L   CO2 24 20 - 32 mmol/L   Calcium 9.2 8.6 - 10.4 mg/dL   Total Protein 6.2 6.1 - 8.1 g/dL   Albumin 3.8 3.6 - 5.1 g/dL   Globulin 2.4 1.9 - 3.7 g/dL (calc)   AG Ratio 1.6 1.0 - 2.5 (calc)   Total Bilirubin 0.4 0.2 - 1.2 mg/dL   Alkaline phosphatase (APISO) 64 37 - 153 U/L   AST 14 10 - 35 U/L   ALT 10 6 - 29 U/L  CBC with Differential/Platelet  Result Value Ref Range   WBC 6.1 3.8 - 10.8 Thousand/uL   RBC 4.25 3.80 - 5.10 Million/uL   Hemoglobin 13.7 11.7 - 15.5 g/dL   HCT 89.3 81.0 - 17.5 %   MCV 92.9 80.0 - 100.0 fL   MCH 32.2 27.0 - 33.0 pg   MCHC 34.7 32.0 - 36.0 g/dL   RDW 10.2 58.5 - 27.7 %   Platelets 316 140 - 400 Thousand/uL   MPV 10.4 7.5 - 12.5 fL   Neutro Abs 3,471 1,500 - 7,800 cells/uL   Lymphs Abs 1,861 850 - 3,900 cells/uL   Absolute Monocytes 464 200 - 950 cells/uL   Eosinophils Absolute 232 15 - 500 cells/uL   Basophils Absolute 73 0 - 200 cells/uL   Neutrophils Relative % 56.9 %   Total Lymphocyte 30.5 %   Monocytes Relative 7.6 %   Eosinophils Relative 3.8 %   Basophils Relative 1.2 %      Assessment & Plan:   Problem List Items Addressed This Visit      Cardiovascular and Mediastinum   Hypertension - Primary    Stable and well controlled on current medication regimen of losartan 50mg  daily and propranolol ER 120mg  daily.  Tolerating it well.   Plan: 1) Labs to be drawn within the next 1-2 weeks and will call with results 2) Continue Losartan 50mg  daily and Propranolol ER 120mg  daily  3) Take blood pressure readings regularly and write in a log.  Bring that log to your next appointment. 4) Heart healthy diet and to exercise every other day for 30 minutes per day, going no more than 2 days in a row without exercise. 5) We will see you back in October for your  physical      Relevant Medications   losartan (COZAAR) 50 MG tablet   propranolol ER (INDERAL LA) 120 MG 24 hr capsule   Other Relevant Orders   CBC with Differential   COMPLETE METABOLIC PANEL WITH GFR   Thyroid Panel With TSH     Digestive   GERD (gastroesophageal reflux disease)    Stable and well controlled GERD.  Currently taking omeprazole 20mg  daily and tolerating it well.   Plan: 1) Continue omeprazole 20mg  daily  2) Continue to avoid spicy & citrus foods and eating 2 hours before bed. 3) We will see you back in October for your physical      Relevant Medications   pantoprazole (PROTONIX) 20 MG tablet     Nervous and Auditory   Drug-induced Parkinsonism (HCC)     Other   Mood disorder (HCC)   Resting tremor    Patient is stable and currently following with [facility] for this.        Relevant Medications  propranolol ER (INDERAL LA) 120 MG 24 hr capsule   Mixed hyperlipidemia    Unknown status of lipids.  Not currently taking Lovaza or other over the counter supplement for hyperlipidemia.  Plan: 1) Have labs drawn within the next 1-2 weeks and will call once results are received to determine treatment plan 2) Follow up in October for your physical      Relevant Medications   losartan (COZAAR) 50 MG tablet   propranolol ER (INDERAL LA) 120 MG 24 hr capsule   Other Relevant Orders   CBC with Differential   COMPLETE METABOLIC PANEL WITH GFR   Lipid Profile   Thyroid Panel With TSH   Anxiety    Improved with a PHQ9 score of 0 during todays appointment.  Continues to take Abilify 5mg  daily and Citalopram 40mg  daily and tolerates well.  Plan: 1) Continue abilify 5mg  once daily 2) Continue citalopram 40mg  once daily 3) Follow up in October 2021 for your physical, sooner should the need arise 4) Patient aware of signs/symptoms or when she would need to seek care (here or the ER)      Relevant Medications   ARIPiprazole (ABILIFY) 5 MG tablet    citalopram (CELEXA) 40 MG tablet   Chronic lower back pain    Has had chronic lower back pain with exacerbation for 4 months.  Has been taking Acetaminophen without relief or change in symptoms. Without radiculopathy, with small spasm in right paraspinal lumbar.  Likely muscular strain  Plan: 1) Referral to physical therapy placed 2) Baclofen 5mg  by mouth every 8 hours as needed for pain 3) Contact our office if back pain progresses or not improving after meeting with physical therapy.      Relevant Medications   Baclofen 5 MG TABS   Other Relevant Orders   Ambulatory referral to Physical Therapy    Other Visit Diagnoses    Multiple allergies       Relevant Medications   fluticasone (FLONASE) 50 MCG/ACT nasal spray   cetirizine (ZYRTEC) 10 MG tablet   Muscle spasm       Relevant Medications   Baclofen 5 MG TABS   Obesity (BMI 30-39.9)       Relevant Orders   Thyroid Panel With TSH   HgB A1c      Meds ordered this encounter  Medications  . ARIPiprazole (ABILIFY) 5 MG tablet    Sig: Take 1 tablet (5 mg total) by mouth daily.    Dispense:  90 tablet    Refill:  1    Order Specific Question:   Supervising Provider    Answer:   Olin Hauser [[id number]]  . citalopram (CELEXA) 40 MG tablet    Sig: Take 1 tablet (40 mg total) by mouth daily.    Dispense:  90 tablet    Refill:  3    Requesting 1 year supply    Order Specific Question:   Supervising Provider    Answer:   Olin Hauser [[id number]]  . fluticasone (FLONASE) 50 MCG/ACT nasal spray    Sig: Place 2 sprays into both nostrils daily.    Dispense:  48 g    Refill:  1    Order Specific Question:   Supervising Provider    Answer:   Olin Hauser [[id number]]  . losartan (COZAAR) 50 MG tablet    Sig: Take 1 tablet (50 mg total) by mouth daily.    Dispense:  90 tablet    Refill:  3    Requesting 1 year supply    Order Specific Question:   Supervising Provider    Answer:   Smitty Cords  [[id number]]  . pantoprazole (PROTONIX) 20 MG tablet    Sig: Take 1 tablet (20 mg total) by mouth daily.    Dispense:  90 tablet    Refill:  3    Requesting 1 year supply    Order Specific Question:   Supervising Provider    Answer:   Smitty Cords [[id number]]  . propranolol ER (INDERAL LA) 120 MG 24 hr capsule    Sig: Take 1 capsule (120 mg total) by mouth daily.    Dispense:  90 capsule    Refill:  3    Requesting 1 year supply    Order Specific Question:   Supervising Provider    Answer:   Smitty Cords [[id number]]  . cetirizine (ZYRTEC) 10 MG tablet    Sig: Take 1 tablet (10 mg total) by mouth daily.    Dispense:  30 tablet    Refill:  11    Order Specific Question:   Supervising Provider    Answer:   Smitty Cords [[id number]]  . Baclofen 5 MG TABS    Sig: Take 1 tablet by mouth every 8 (eight) hours as needed (For low back pain/spasms).    Dispense:  90 tablet    Refill:  0    Order Specific Question:   Supervising Provider    Answer:   Smitty Cords [[id number]]      Follow up plan: Return in about 31 weeks (around 05/04/2020) for Annual physical & PAP.   Charlaine Dalton, FNP Family Nurse Practitioner [facility] 09/30/2019, 2:24 PM

## 2019-09-30 NOTE — Assessment & Plan Note (Signed)
Patient is stable and currently following with [facility] for this.

## 2019-09-30 NOTE — Assessment & Plan Note (Signed)
Unknown status of lipids.  Not currently taking Lovaza or other over the counter supplement for hyperlipidemia.  Plan: 1) Have labs drawn within the next 1-2 weeks and will call once results are received to determine treatment plan 2) Follow up in October for your physical

## 2019-09-30 NOTE — Assessment & Plan Note (Signed)
Stable and well controlled GERD.  Currently taking omeprazole 20mg  daily and tolerating it well.   Plan: 1) Continue omeprazole 20mg  daily  2) Continue to avoid spicy & citrus foods and eating 2 hours before bed. 3) We will see you back in October for your physical

## 2019-09-30 NOTE — Assessment & Plan Note (Signed)
Improved with a PHQ9 score of 0 during todays appointment.  Continues to take Abilify 5mg  daily and Citalopram 40mg  daily and tolerates well.  Plan: 1) Continue abilify 5mg  once daily 2) Continue citalopram 40mg  once daily 3) Follow up in October 2021 for your physical, sooner should the need arise 4) Patient aware of signs/symptoms or when she would need to seek care (here or the ER)

## 2019-10-02 NOTE — Progress Notes (Signed)
I have reviewed this encounter including the documentation in this note and/or discussed this patient with the provider, Nicole [name] FNP. I am certifying that I agree with the content of this note as supervising physician.  Carolanne Mercier, DO [facility] 10/02/2019, 11:15 AM  

## 2019-10-03 NOTE — Chronic Care Management (AMB) (Signed)
  Chronic Care Management   Outreach Note  10/03/2019 Name: Elizabeth [name] MRN: [id number] DOB: [birth year]  Elizabeth [name] is a 64 y.o. year old female who is a primary care patient of Anitra Lauth, Jodelle Gross, FNP. I reached out to [name] by phone today in response to a referral sent by [name]'s health plan.     A second unsuccessful telephone outreach was attempted today. The patient was referred to the case management team for assistance with care management and care coordination.   Follow Up Plan: A HIPPA compliant phone message was left for the patient providing contact information and requesting a return call.  The care management team will reach out to the patient again over the next 7 days.  If patient returns call to provider office, please advise to call Embedded Care Management Care Guide Penne Lash at [phone]  Penne Lash, RMA Care Guide, Embedded Care Coordination [facility]  [place], [place] [address] Direct Dial: [phone] [email or web address] Website: [email or web address]

## 2019-10-03 NOTE — Chronic Care Management (AMB) (Signed)
  Chronic Care Management   Note  10/03/2019 Name: Danaja Lasota MRN: [id number] DOB: 1956/01/04  Aidee Latimore Fukushima is a 64 y.o. year old female who is a primary care patient of Lorine Bears, [name], FNP. I reached out to [name] by phone today in response to a referral sent by Ms. Jennet Maduro Cournoyer's health plan.     Ms. Gillentine was given information about Chronic Care Management services today including:  1. CCM service includes personalized support from designated clinical staff supervised by her physician, including individualized plan of care and coordination with other care providers 2. 24/7 contact phone numbers for assistance for urgent and routine care needs. 3. Service will only be billed when office clinical staff spend 20 minutes or more in a month to coordinate care. 4. Only one practitioner may furnish and bill the service in a calendar month. 5. The patient may stop CCM services at any time (effective at the end of the month) by phone call to the office staff. 6. The patient will be responsible for cost sharing (co-pay) of up to 20% of the service fee (after annual deductible is met).  Patient did not agree to enrollment in care management services and does not wish to consider at this time.  Follow up plan: The patient has been provided with contact information for the care management team and has been advised to call with any health related questions or concerns.   Noreene Larsson, [place], [place], [place] [address] Direct Dial: [phone] [email or web address] Website: [email or web address]

## 2019-10-13 ENCOUNTER — Other Ambulatory Visit: Payer: Medicare Other

## 2019-10-13 ENCOUNTER — Other Ambulatory Visit: Payer: Self-pay

## 2019-10-13 DIAGNOSIS — E782 Mixed hyperlipidemia: Secondary | ICD-10-CM | POA: Diagnosis not present

## 2019-10-13 DIAGNOSIS — I10 Essential (primary) hypertension: Secondary | ICD-10-CM | POA: Diagnosis not present

## 2019-10-13 LAB — CBC WITH DIFFERENTIAL/PLATELET
Absolute Monocytes: 481 cells/uL (ref 200–950)
Basophils Absolute: 81 cells/uL (ref 0–200)
Basophils Relative: 1.1 %
Eosinophils Absolute: 229 cells/uL (ref 15–500)
Eosinophils Relative: 3.1 %
HCT: 41.8 % (ref 35.0–45.0)
Hemoglobin: 14.1 g/dL (ref 11.7–15.5)
Lymphs Abs: 2797 cells/uL (ref 850–3900)
MCH: 32.3 pg (ref 27.0–33.0)
MCHC: 33.7 g/dL (ref 32.0–36.0)
MCV: 95.9 fL (ref 80.0–100.0)
MPV: 10.4 fL (ref 7.5–12.5)
Monocytes Relative: 6.5 %
Neutro Abs: 3811 cells/uL (ref 1500–7800)
Neutrophils Relative %: 51.5 %
Platelets: 367 10*3/uL (ref 140–400)
RBC: 4.36 10*6/uL (ref 3.80–5.10)
RDW: 11.8 % (ref 11.0–15.0)
Total Lymphocyte: 37.8 %
WBC: 7.4 10*3/uL (ref 3.8–10.8)

## 2019-10-13 LAB — ADVANCED WRITTEN NOTIFICATION (AWN) TEST REFUSAL: AWN TEST REFUSED: 16802

## 2019-10-13 LAB — LIPID PANEL
Cholesterol: 211 mg/dL — ABNORMAL HIGH (ref <200)
HDL: 47 mg/dL — ABNORMAL LOW (ref >=50)
LDL Cholesterol (Calc): 136 mg/dL (calc) — ABNORMAL HIGH
Non-HDL Cholesterol (Calc): 164 mg/dL (calc) — ABNORMAL HIGH (ref <130)
Total CHOL/HDL Ratio: 4.5 (calc) (ref <5.0)
Triglycerides: 146 mg/dL (ref <150)

## 2019-10-13 LAB — COMPLETE METABOLIC PANEL WITH GFR
AG Ratio: 1.8 (calc) (ref 1.0–2.5)
ALT: 11 U/L (ref 6–29)
AST: 14 U/L (ref 10–35)
Albumin: 4.2 g/dL (ref 3.6–5.1)
Alkaline phosphatase (APISO): 72 U/L (ref 37–153)
BUN/Creatinine Ratio: 14 (calc) (ref 6–22)
BUN: 20 mg/dL (ref 7–25)
CO2: 24 mmol/L (ref 20–32)
Calcium: 10.4 mg/dL (ref 8.6–10.4)
Chloride: 107 mmol/L (ref 98–110)
Creat: 1.44 mg/dL — ABNORMAL HIGH (ref 0.50–0.99)
GFR, Est African American: 45 mL/min/{1.73_m2} — ABNORMAL LOW (ref >=60)
GFR, Est Non African American: 39 mL/min/{1.73_m2} — ABNORMAL LOW (ref >=60)
Globulin: 2.4 g/dL (calc) (ref 1.9–3.7)
Glucose, Bld: 103 mg/dL — ABNORMAL HIGH (ref 65–99)
Potassium: 4.7 mmol/L (ref 3.5–5.3)
Sodium: 139 mmol/L (ref 135–146)
Total Bilirubin: 0.4 mg/dL (ref 0.2–1.2)
Total Protein: 6.6 g/dL (ref 6.1–8.1)

## 2019-10-13 LAB — THYROID PANEL WITH TSH
Free Thyroxine Index: 3.2 (ref 1.4–3.8)
T3 Uptake: 31 % (ref 22–35)
T4, Total: 10.3 ug/dL (ref 5.1–11.9)
TSH: 3.22 mIU/L (ref 0.40–4.50)

## 2019-10-14 NOTE — Progress Notes (Signed)
Kidney function has declined to near where it was 4 years ago (2017).  STOP the Baclofen.  Avoid all NSAIDs (Ibuprofen, aleve, advil, motrin, goody's bc powder) and to increase her water intake.  Will plan to repeat this in 1 month and if still low, will send referral to Nephrology for evaluation.  Her lipids remain elevated and I don't see that she has been on any cholesterol lowering medications in the past.  I am going to send in a prescription for atorvastatin 10mg  to take daily and we can repeat her lipid levels in 3 months.  CBC and thyroid are great

## 2019-10-17 DIAGNOSIS — G2119 Other drug induced secondary parkinsonism: Secondary | ICD-10-CM | POA: Diagnosis not present

## 2019-10-19 ENCOUNTER — Other Ambulatory Visit: Payer: Self-pay

## 2019-10-19 MED ORDER — ATORVASTATIN CALCIUM 10 MG PO TABS: 10.0000 mg | ORAL_TABLET | Freq: Every day | ORAL | 1 refills | Status: DC

## 2019-10-21 ENCOUNTER — Encounter: Payer: Self-pay | Admitting: Family Medicine

## 2019-11-15 ENCOUNTER — Encounter: Payer: Self-pay | Admitting: Family Medicine

## 2019-11-15 ENCOUNTER — Other Ambulatory Visit: Payer: Self-pay

## 2019-11-15 ENCOUNTER — Ambulatory Visit: Payer: Medicare Other | Admitting: Family Medicine

## 2019-11-15 ENCOUNTER — Ambulatory Visit (INDEPENDENT_AMBULATORY_CARE_PROVIDER_SITE_OTHER): Payer: Medicare Other | Admitting: Family Medicine

## 2019-11-15 DIAGNOSIS — N760 Acute vaginitis: Secondary | ICD-10-CM | POA: Diagnosis not present

## 2019-11-15 DIAGNOSIS — J019 Acute sinusitis, unspecified: Secondary | ICD-10-CM | POA: Insufficient documentation

## 2019-11-15 DIAGNOSIS — R05 Cough: Secondary | ICD-10-CM | POA: Diagnosis not present

## 2019-11-15 DIAGNOSIS — R0981 Nasal congestion: Secondary | ICD-10-CM

## 2019-11-15 MED ORDER — AMOXICILLIN-POT CLAVULANATE 875-125 MG PO TABS: 1.0000 | ORAL_TABLET | Freq: Two times a day (BID) | ORAL | 0 refills | Status: DC

## 2019-11-15 MED ORDER — FLUCONAZOLE 150 MG PO TABS: ORAL_TABLET | ORAL | 0 refills | Status: DC

## 2019-11-15 NOTE — Patient Instructions (Signed)
As we discussed, we are treating you for an acute sinus infection.  I have sent in a prescription for Augmentin to take 1 tablet 2x per day for the next 10 days.  This medication may be hard on your stomach, be sure to take this with food.  I have also sent in a prescription for Diflucan to take at the onset of any vaginitis symptoms.  You can continue to take your Zyrtec and Coriciden HBP according to packaging directions.  We will plan to see you back if symptoms worsen or fail to improve.  You will receive a survey after today's visit either digitally by e-mail or paper by [employer]. Your experiences and feedback matter to [place].  Please respond so we know how we are doing as we provide care for you.  Call us with any questions/concerns/needs.  It is my goal to be available to you for your health concerns.  Thanks for choosing me to be a partner in your healthcare needs!  Charlaine Dalton, FNP-C Family Nurse Practitioner [facility] Phone: [phone]

## 2019-11-15 NOTE — Assessment & Plan Note (Signed)
Likely acute sinus infection based on history and reported symptoms.  Treating with Augmentin 1 tablet 2x per day for 10 days.  Can continue zyrtec and coriciden HBP according to packing directions.    Plan: 1. Take antibiotics as directed 2. Sent in Rx for Diflucan for post-antibiotic yeast infection, educated on how to take this 3. Follow up as needed for this

## 2019-11-15 NOTE — Progress Notes (Signed)
Virtual Visit via Telephone  The purpose of this virtual visit is to provide medical care while limiting exposure to the novel coronavirus (COVID19) for both patient and office staff.  Consent was obtained for phone visit:  Yes.   Answered questions that patient had about telehealth interaction:  Yes.   I discussed the limitations, risks, security and privacy concerns of performing an evaluation and management service by telephone. I also discussed with the patient that there may be a patient responsible charge related to this service. The patient expressed understanding and agreed to proceed.  Patient is at home and is accessed via telephone Services are provided by Charlaine Dalton, FNP-C from [facility])  ---------------------------------------------------------------------- Chief Complaint  Patient presents with  . Sinus Problem    The pt state her symptoms started off with her allergies. Watery eyes, congestion and slight cough.  Right side of head pressure w/ yellowish nasal drainage x 1 week     S: Reviewed CMA documentation. I have called patient and gathered additional HPI as follows:  Elizabeth [name] presents to clinic via telemedicine appointment for concerns of sinus infection.  Has had purulent nasal drainage x 1 week with nasal/sinus congestion, watery eyes and slight cough with right sided headache.  Reports has been taking Coriciden HBP and Zyrtec without any change in her symptoms.  Has not found anything that has made her symptoms better.  Patient is currently home Denies any high risk travel to areas of current concern for COVID19. Denies any known or suspected exposure to person with or possibly with COVID19.  Denies any fevers, chills, sweats, body ache, shortness of breath, abdominal pain, diarrhea  Past Medical History:  Diagnosis Date  . Allergy 08/04/1998  . Anxiety   . Arthritis 08/04/2008  . Depression   . GERD (gastroesophageal reflux  disease)   . Hypertension 11/02/1997  . Insomnia   . Mixed hyperlipidemia   . Obesity    Social History   Tobacco Use  . Smoking status: Current Every Day Smoker    Packs/day: 0.25    Years: 40.00    Pack years: 10.00    Types: Cigarettes  . Smokeless tobacco: Never Used  . Tobacco comment: pt smoking 8 cigs daily  Substance Use Topics  . Alcohol use: No    Comment: about once a month   . Drug use: No    Current Outpatient Medications:  .  ARIPiprazole (ABILIFY) 5 MG tablet, Take 1 tablet (5 mg total) by mouth daily., Disp: 90 tablet, Rfl: 1 .  aspirin 81 MG tablet, Take 81 mg by mouth daily., Disp: , Rfl:  .  atorvastatin (LIPITOR) 10 MG tablet, Take 1 tablet (10 mg total) by mouth daily., Disp: 90 tablet, Rfl: 1 .  Baclofen 5 MG TABS, Take 1 tablet by mouth every 8 (eight) hours as needed (For low back pain/spasms)., Disp: 90 tablet, Rfl: 0 .  Blood Pressure Monitoring (SM WRIST CUFF BP MONITOR) MISC, 1 Units by Does not apply route daily., Disp: 1 each, Rfl: 0 .  cetirizine (ZYRTEC) 10 MG tablet, Take 1 tablet (10 mg total) by mouth daily., Disp: 30 tablet, Rfl: 11 .  citalopram (CELEXA) 40 MG tablet, Take 1 tablet (40 mg total) by mouth daily., Disp: 90 tablet, Rfl: 3 .  Cyanocobalamin ([employer] B12 GUMMIES) 500 MCG CHEW, Chew 1,000 mg by mouth daily. , Disp: , Rfl:  .  fluticasone (FLONASE) 50 MCG/ACT nasal spray, Place 2 sprays into  both nostrils daily., Disp: 48 g, Rfl: 1 .  losartan (COZAAR) 50 MG tablet, Take 1 tablet (50 mg total) by mouth daily., Disp: 90 tablet, Rfl: 3 .  Multiple Vitamin (MULTIVITAMIN) tablet, Take 1 tablet by mouth daily., Disp: , Rfl:  .  pantoprazole (PROTONIX) 20 MG tablet, Take 1 tablet (20 mg total) by mouth daily., Disp: 90 tablet, Rfl: 3 .  propranolol ER (INDERAL LA) 120 MG 24 hr capsule, Take 1 capsule (120 mg total) by mouth daily., Disp: 90 capsule, Rfl: 3 .  amoxicillin-clavulanate (AUGMENTIN) 875-125 MG tablet, Take 1 tablet by mouth 2 (two)  times daily., Disp: 20 tablet, Rfl: 0 .  fluconazole (DIFLUCAN) 150 MG tablet, Take 1 tablet now and repeat dose in 72 hours if having continued symptoms., Disp: 2 tablet, Rfl: 0  Depression screen [facility] 2/9 09/30/2019 04/05/2019 03/01/2019  Decreased Interest 0 0 0  Down, Depressed, Hopeless 0 0 0  PHQ - 2 Score 0 0 0  Altered sleeping - - 1  Tired, decreased energy - - 0  Change in appetite - - 0  Feeling bad or failure about yourself  - - 0  Trouble concentrating - - 0  Moving slowly or fidgety/restless - - 0  Suicidal thoughts - - 0  PHQ-9 Score - - 1  Difficult doing work/chores - - Not difficult at all    GAD 7 : Generalized Anxiety Score 03/23/2018 12/11/2017  Nervous, Anxious, on Edge 0 2  Control/stop worrying 1 2  Worry too much - different things 0 2  Trouble relaxing 0 2  Restless 1 2  Easily annoyed or irritable 0 2  Afraid - awful might happen 0 2  Total GAD 7 Score 2 14  Anxiety Difficulty Not difficult at all Somewhat difficult    -------------------------------------------------------------------------- O: No physical exam performed due to remote telephone encounter.  Physical Exam: Patient remotely monitored without video.  Verbal communication appropriate.  Cognition normal.   Recent Results (from the past 2160 hour(s))  CBC with Differential     Status: None   Collection Time: 10/13/19 10:56 AM  Result Value Ref Range   WBC 7.4 3.8 - 10.8 Thousand/uL   RBC 4.36 3.80 - 5.10 Million/uL   Hemoglobin 14.1 11.7 - 15.5 g/dL   HCT 07.6 22.6 - 33.3 %   MCV 95.9 80.0 - 100.0 fL   MCH 32.3 27.0 - 33.0 pg   MCHC 33.7 32.0 - 36.0 g/dL   RDW 54.5 62.5 - 63.8 %   Platelets 367 140 - 400 Thousand/uL   MPV 10.4 7.5 - 12.5 fL   Neutro Abs 3,811 1,500 - 7,800 cells/uL   Lymphs Abs 2,797 850 - 3,900 cells/uL   Absolute Monocytes 481 200 - 950 cells/uL   Eosinophils Absolute 229 15 - 500 cells/uL   Basophils Absolute 81 0 - 200 cells/uL   Neutrophils Relative % 51.5 %    Total Lymphocyte 37.8 %   Monocytes Relative 6.5 %   Eosinophils Relative 3.1 %   Basophils Relative 1.1 %  COMPLETE METABOLIC PANEL WITH GFR     Status: Abnormal   Collection Time: 10/13/19 10:56 AM  Result Value Ref Range   Glucose, Bld 103 (H) 65 - 99 mg/dL    Comment: .            Fasting reference interval . For someone without known diabetes, a glucose value between 100 and 125 mg/dL is consistent with prediabetes and should be confirmed with  a follow-up test. .    BUN 20 7 - 25 mg/dL   Creat 1.44 (H) 0.50 - 0.99 mg/dL    Comment: For patients >73 years of age, the reference limit for Creatinine is approximately 13% higher for people identified as African-American. .    GFR, Est Non African American 39 (L) > OR = 60 mL/min/1.47m2   GFR, Est African American 45 (L) > OR = 60 mL/min/1.43m2   BUN/Creatinine Ratio 14 6 - 22 (calc)   Sodium 139 135 - 146 mmol/L   Potassium 4.7 3.5 - 5.3 mmol/L   Chloride 107 98 - 110 mmol/L   CO2 24 20 - 32 mmol/L   Calcium 10.4 8.6 - 10.4 mg/dL   Total Protein 6.6 6.1 - 8.1 g/dL   Albumin 4.2 3.6 - 5.1 g/dL   Globulin 2.4 1.9 - 3.7 g/dL (calc)   AG Ratio 1.8 1.0 - 2.5 (calc)   Total Bilirubin 0.4 0.2 - 1.2 mg/dL   Alkaline phosphatase (APISO) 72 37 - 153 U/L   AST 14 10 - 35 U/L   ALT 11 6 - 29 U/L  Lipid Profile     Status: Abnormal   Collection Time: 10/13/19 10:56 AM  Result Value Ref Range   Cholesterol 211 (H) <200 mg/dL   HDL 47 (L) > OR = 50 mg/dL   Triglycerides 146 <150 mg/dL   LDL Cholesterol (Calc) 136 (H) mg/dL (calc)    Comment: Reference range: <100 . Desirable range <100 mg/dL for primary prevention;   <70 mg/dL for patients with CHD or diabetic patients  with > or = 2 CHD risk factors. Marland Kitchen LDL-C is now calculated using the Martin-Hopkins  calculation, which is a validated novel method providing  better accuracy than the Friedewald equation in the  estimation of LDL-C.  Cresenciano Genre et al. Annamaria Helling. 6010;932(35):  2061-2068  (http://education.[facility].com/faq/FAQ164)    Total CHOL/HDL Ratio 4.5 <5.0 (calc)   Non-HDL Cholesterol (Calc) 164 (H) <130 mg/dL (calc)    Comment: For patients with diabetes plus 1 major ASCVD risk  factor, treating to a non-HDL-C goal of <100 mg/dL  (LDL-C of <70 mg/dL) is considered a therapeutic  option.   Thyroid Panel With TSH     Status: None   Collection Time: 10/13/19 10:56 AM  Result Value Ref Range   T3 Uptake 31 22 - 35 %   T4, Total 10.3 5.1 - 11.9 mcg/dL   Free Thyroxine Index 3.2 1.4 - 3.8   TSH 3.22 0.40 - 4.50 mIU/L  Advanced Written Notification [facility]) Test Refusal     Status: None   Collection Time: 10/13/19 10:56 AM  Result Value Ref Range   RAM1      Comment: . Be advised that your patient has indicated on the advance written notice their decision not to receive the following laboratory tests. As a result, the tests will not be performed.    AWN TEST REFUSED [id number]     -------------------------------------------------------------------------- A&P:  Problem List Items Addressed This Visit      Respiratory   Acute sinus infection - Primary    Likely acute sinus infection based on history and reported symptoms.  Treating with Augmentin 1 tablet 2x per day for 10 days.  Can continue zyrtec and coriciden HBP according to packing directions.    Plan: 1. Take antibiotics as directed 2. Sent in Rx for Diflucan for post-antibiotic yeast infection, educated on how to take this 3. Follow up as needed for  this       Relevant Medications   fluconazole (DIFLUCAN) 150 MG tablet   amoxicillin-clavulanate (AUGMENTIN) 875-125 MG tablet    Other Visit Diagnoses    Acute vaginitis       Relevant Medications   fluconazole (DIFLUCAN) 150 MG tablet      Meds ordered this encounter  Medications  . fluconazole (DIFLUCAN) 150 MG tablet    Sig: Take 1 tablet now and repeat dose in 72 hours if having continued symptoms.    Dispense:  2 tablet     Refill:  0  . amoxicillin-clavulanate (AUGMENTIN) 875-125 MG tablet    Sig: Take 1 tablet by mouth 2 (two) times daily.    Dispense:  20 tablet    Refill:  0    Follow-up: - Return as needed for this  Patient verbalizes understanding with the above medical recommendations including the limitation of remote medical advice.  Specific follow-up and call-back criteria were given for patient to follow-up or seek medical care more urgently if needed.  - Time spent in direct consultation with patient on phone: 6 minutes  Charlaine Dalton, FNP-C [facility] 11/15/2019, 3:33 PM

## 2019-12-20 ENCOUNTER — Encounter: Payer: Self-pay | Admitting: Family Medicine

## 2019-12-20 ENCOUNTER — Other Ambulatory Visit: Payer: Self-pay | Admitting: Family Medicine

## 2019-12-20 DIAGNOSIS — R944 Abnormal results of kidney function studies: Secondary | ICD-10-CM

## 2019-12-20 NOTE — Progress Notes (Signed)
com

## 2019-12-21 ENCOUNTER — Telehealth: Payer: Self-pay

## 2019-12-21 ENCOUNTER — Encounter: Payer: Self-pay | Admitting: Family Medicine

## 2019-12-21 NOTE — Telephone Encounter (Signed)
-----   Message from Tarri Fuller, FNP sent at 12/21/2019 12:57 PM EDT ----- Regarding: MyChart Message I lost the MyChart message for Elizabeth [name] that was sent over - I found it it in the chart but unsure how to open it and continue the chain.  Her order for her CMP is in the computer.  I don't see where she has had B12 labs with [place] in the past, would be best to have her neurologist draw those labs, or if she prefers, can wait until she sees neurology and have the labs for the B12 and the CMP drawn at the same time.

## 2019-12-21 NOTE — Telephone Encounter (Signed)
Attempted to contact the patient back to notify her of the labs that are recommended by her provider. No answer, lmom to return my call.

## 2019-12-29 ENCOUNTER — Encounter: Payer: Self-pay | Admitting: Family Medicine

## 2019-12-29 LAB — COMPLETE METABOLIC PANEL WITH GFR
AG Ratio: 1.7 (calc) (ref 1.0–2.5)
ALT: 13 U/L (ref 6–29)
AST: 14 U/L (ref 10–35)
Albumin: 4 g/dL (ref 3.6–5.1)
Alkaline phosphatase (APISO): 72 U/L (ref 37–153)
BUN/Creatinine Ratio: 10 (calc) (ref 6–22)
BUN: 11 mg/dL (ref 7–25)
CO2: 24 mmol/L (ref 20–32)
Calcium: 9.9 mg/dL (ref 8.6–10.4)
Chloride: 106 mmol/L (ref 98–110)
Creat: 1.1 mg/dL — ABNORMAL HIGH (ref 0.50–0.99)
GFR, Est African American: 61 mL/min/{1.73_m2} (ref >=60)
GFR, Est Non African American: 53 mL/min/{1.73_m2} — ABNORMAL LOW (ref >=60)
Globulin: 2.4 g/dL (calc) (ref 1.9–3.7)
Glucose, Bld: 98 mg/dL (ref 65–99)
Potassium: 4.4 mmol/L (ref 3.5–5.3)
Sodium: 140 mmol/L (ref 135–146)
Total Bilirubin: 0.4 mg/dL (ref 0.2–1.2)
Total Protein: 6.4 g/dL (ref 6.1–8.1)

## 2020-01-09 ENCOUNTER — Encounter: Payer: Self-pay | Admitting: Family Medicine

## 2020-01-09 ENCOUNTER — Other Ambulatory Visit: Payer: Self-pay | Admitting: Family Medicine

## 2020-01-09 DIAGNOSIS — M545 Low back pain, unspecified: Secondary | ICD-10-CM

## 2020-01-09 DIAGNOSIS — G8929 Other chronic pain: Secondary | ICD-10-CM

## 2020-01-09 MED ORDER — CYCLOBENZAPRINE HCL 5 MG PO TABS: 5.0000 mg | ORAL_TABLET | Freq: Three times a day (TID) | ORAL | 1 refills | Status: DC | PRN

## 2020-02-03 ENCOUNTER — Other Ambulatory Visit: Payer: Self-pay | Admitting: Family Medicine

## 2020-02-03 DIAGNOSIS — Z889 Allergy status to unspecified drugs, medicaments and biological substances status: Secondary | ICD-10-CM

## 2020-02-03 NOTE — Telephone Encounter (Signed)
Requested Prescriptions  Pending Prescriptions Disp Refills  . fluticasone (FLONASE) 50 MCG/ACT nasal spray [Pharmacy Med Name: FLUTICASONE   SPRAY  PROP.] 48 g 1    Sig: USE 2 SPRAYS IN BOTH  NOSTRILS DAILY     Ear, Nose, and Throat: Nasal Preparations - Corticosteroids Passed - 02/03/2020  2:41 PM      Passed - Valid encounter within last 12 months    Recent Outpatient Visits          2 months ago Acute non-recurrent sinusitis, unspecified location   [facility], Jodelle Gross, FNP   4 months ago Essential hypertension   [facility], Jodelle Gross, FNP   11 months ago Essential hypertension   [facility] Kyung Rudd, Alison Stalling, NP   1 year ago Encounter for annual physical exam   [facility] Galen Manila, NP   1 year ago Essential hypertension   [facility] Galen Manila, NP      Future Appointments            In 3 months [name], Jodelle Gross, FNP [facility], [facility]           . atorvastatin (LIPITOR) 10 MG tablet [Pharmacy Med Name: ATORVASTATIN  10MG   TAB] 90 tablet 3    Sig: TAKE 1 TABLET BY MOUTH  DAILY     Cardiovascular:  Antilipid - Statins Failed - 02/03/2020  2:41 PM      Failed - Total Cholesterol in normal range and within 360 days    Cholesterol, Total  Date Value Ref Range Status  08/08/2015 253 (H) 100 - 199 mg/dL Final   Cholesterol  Date Value Ref Range Status  10/13/2019 211 (H) <200 mg/dL Final         Failed - LDL in normal range and within 360 days    LDL Cholesterol (Calc)  Date Value Ref Range Status  10/13/2019 136 (H) mg/dL (calc) Final    Comment:    Reference range: <100 . Desirable range <100 mg/dL for primary prevention;   <70 mg/dL for patients with CHD or diabetic patients  with > or = 2 CHD risk factors. 12/13/2019 LDL-C is now calculated using the Martin-Hopkins  calculation, which is a validated novel method providing  better  accuracy than the Friedewald equation in the  estimation of LDL-C.  Marland Kitchen et al. Horald Pollen. [place] Ahr): 2061-2068  (http://education.[facility].com/faq/FAQ164)          Failed - HDL in normal range and within 360 days    HDL  Date Value Ref Range Status  10/13/2019 47 (L) > OR = 50 mg/dL Final  12/13/2019 58 42/70/6237 mg/dL Final         Passed - Triglycerides in normal range and within 360 days    Triglycerides  Date Value Ref Range Status  10/13/2019 146 <150 mg/dL Final         Passed - Patient is not pregnant      Passed - Valid encounter within last 12 months    Recent Outpatient Visits          2 months ago Acute non-recurrent sinusitis, unspecified location   [facility], [facility], FNP   4 months ago Essential hypertension   [facility], [facility], FNP   11 months ago Essential hypertension   [facility], Kyung Rudd, NP  1 year ago Encounter for annual physical exam   [facility] Galen Manila, NP   1 year ago Essential hypertension   [facility] Kyung Rudd, Alison Stalling, NP      Future Appointments            In 3 months [name], Jodelle Gross, FNP [facility], [facility]

## 2020-02-19 ENCOUNTER — Other Ambulatory Visit: Payer: Self-pay | Admitting: Family Medicine

## 2020-02-28 ENCOUNTER — Encounter: Payer: Self-pay | Admitting: Family Medicine

## 2020-03-03 ENCOUNTER — Other Ambulatory Visit: Payer: Self-pay | Admitting: Family Medicine

## 2020-03-03 DIAGNOSIS — G8929 Other chronic pain: Secondary | ICD-10-CM

## 2020-03-03 NOTE — Telephone Encounter (Signed)
Requested medication (s) are due for refill today: yes  Requested medication (s) are on the active medication list: yes  Last refill:  01/09/20  Future visit scheduled: yes  Notes to clinic:  med not delegated to NT to RF   Requested Prescriptions  Pending Prescriptions Disp Refills   cyclobenzaprine (FLEXERIL) 5 MG tablet [Pharmacy Med Name: CYCLOBENZAPRINE  5MG   TAB] 90 tablet 1    Sig: TAKE 1 TABLET BY MOUTH 3  TIMES DAILY AS NEEDED FOR  MUSCLE SPASM(S)      Not Delegated - Analgesics:  Muscle Relaxants Failed - 03/03/2020 12:27 PM      Failed - This refill cannot be delegated      Passed - Valid encounter within last 6 months    Recent Outpatient Visits           3 months ago Acute non-recurrent sinusitis, unspecified location   [facility], [facility], FNP   5 months ago Essential hypertension   [facility], [facility], FNP   1 year ago Essential hypertension   [facility], Kyung Rudd, NP   1 year ago Encounter for annual physical exam   [facility], NP   1 year ago Essential hypertension   [facility], NP       Future Appointments             In 2 months [name], Galen Manila, FNP [facility], [facility]

## 2020-03-05 ENCOUNTER — Encounter: Payer: Self-pay | Admitting: Family Medicine

## 2020-03-14 DIAGNOSIS — M5416 Radiculopathy, lumbar region: Secondary | ICD-10-CM | POA: Diagnosis not present

## 2020-03-15 ENCOUNTER — Encounter: Payer: Self-pay | Admitting: Family Medicine

## 2020-04-05 ENCOUNTER — Other Ambulatory Visit: Payer: Self-pay | Admitting: Family Medicine

## 2020-04-05 DIAGNOSIS — F419 Anxiety disorder, unspecified: Secondary | ICD-10-CM

## 2020-04-05 NOTE — Telephone Encounter (Signed)
Requested medication (s) are due for refill today: Yes  Requested medication (s) are on the active medication list: Yes  Last refill:  09/30/19  Future visit scheduled: Yes  Notes to clinic:  See request.    Requested Prescriptions  Pending Prescriptions Disp Refills   ARIPiprazole (ABILIFY) 5 MG tablet [Pharmacy Med Name: ARIPIPRAZOLE  5MG   TAB] 90 tablet 3    Sig: TAKE 1 TABLET BY MOUTH  DAILY      Not Delegated - Psychiatry:  Antipsychotics - Second Generation (Atypical) - aripiprazole Failed - 04/05/2020  4:56 AM      Failed - This refill cannot be delegated      Passed - Valid encounter within last 6 months    Recent Outpatient Visits           4 months ago Acute non-recurrent sinusitis, unspecified location   [facility], [facility], FNP   6 months ago Essential hypertension   [facility], [facility], FNP   1 year ago Essential hypertension   [facility], NP   1 year ago Encounter for annual physical exam   [facility], NP   2 years ago Essential hypertension   [facility], Kyung Rudd, NP       Future Appointments             In 1 month [name], Alison Stalling, FNP [facility], [facility]

## 2020-04-05 NOTE — Telephone Encounter (Signed)
Requested  medications are  due for refill today yes  Requested medications are on the active medication list yes  Last refill 7/2  Last visit Feb 2021  Future visit scheduled Oct 2021  Notes to clinic Not Delegated

## 2020-04-24 ENCOUNTER — Other Ambulatory Visit: Payer: Self-pay | Admitting: Family Medicine

## 2020-04-24 DIAGNOSIS — G8929 Other chronic pain: Secondary | ICD-10-CM

## 2020-04-24 NOTE — Telephone Encounter (Signed)
Requested medications are due for refill today? Yes - This medication refill cannot be delegated.    Requested medications are on active medication list?  Yes  Last Refill:  03/05/2020  # 90 with one refill.    Future visit scheduled?  Yes  Notes to Clinic:  This medication refill cannot be delegated.    

## 2020-04-25 DIAGNOSIS — G2119 Other drug induced secondary parkinsonism: Secondary | ICD-10-CM | POA: Diagnosis not present

## 2020-05-29 ENCOUNTER — Encounter: Payer: Medicare Other | Admitting: Family Medicine

## 2020-06-11 ENCOUNTER — Encounter: Payer: Medicare Other | Admitting: Family Medicine

## 2020-06-18 DIAGNOSIS — M5416 Radiculopathy, lumbar region: Secondary | ICD-10-CM | POA: Diagnosis not present

## 2020-06-18 DIAGNOSIS — G8310 Monoplegia of lower limb affecting unspecified side: Secondary | ICD-10-CM | POA: Diagnosis not present

## 2020-07-04 ENCOUNTER — Encounter: Payer: Medicare Other | Admitting: Family Medicine

## 2020-07-18 ENCOUNTER — Encounter: Payer: Medicare Other | Admitting: Family Medicine

## 2020-07-19 ENCOUNTER — Other Ambulatory Visit: Payer: Self-pay | Admitting: Family Medicine

## 2020-07-19 DIAGNOSIS — F419 Anxiety disorder, unspecified: Secondary | ICD-10-CM

## 2020-07-19 DIAGNOSIS — I10 Essential (primary) hypertension: Secondary | ICD-10-CM

## 2020-07-19 DIAGNOSIS — G252 Other specified forms of tremor: Secondary | ICD-10-CM

## 2020-07-19 DIAGNOSIS — K219 Gastro-esophageal reflux disease without esophagitis: Secondary | ICD-10-CM

## 2020-07-19 NOTE — Telephone Encounter (Signed)
Requested Prescriptions  Pending Prescriptions Disp Refills  . pantoprazole (PROTONIX) 20 MG tablet [Pharmacy Med Name: Pantoprazole Sodium 20 MG Oral Tablet Delayed Release] 90 tablet 0    Sig: TAKE 1 TABLET BY MOUTH  DAILY     Gastroenterology: Proton Pump Inhibitors Passed - 07/19/2020  2:51 PM      Passed - Valid encounter within last 12 months    Recent Outpatient Visits          8 months ago Acute non-recurrent sinusitis, unspecified location   [facility], Jodelle Gross, FNP   9 months ago Essential hypertension   [facility], Jodelle Gross, FNP   1 year ago Essential hypertension   [facility] Galen Manila, NP   1 year ago Encounter for annual physical exam   [facility] Galen Manila, NP   2 years ago Essential hypertension   [facility] Kyung Rudd, Alison Stalling, NP      Future Appointments            In 1 week [name], Jodelle Gross, FNP [facility], [facility]           . propranolol ER (INDERAL LA) 120 MG 24 hr capsule [Pharmacy Med Name: PROPRANOLOL  120MG   CAP  EXTENDED RELEASE] 90 capsule 0    Sig: TAKE 1 CAPSULE BY MOUTH  DAILY     Cardiovascular:  Beta Blockers Failed - 07/19/2020  2:51 PM      Failed - Valid encounter within last 6 months    Recent Outpatient Visits          8 months ago Acute non-recurrent sinusitis, unspecified location   [facility], [facility], FNP   9 months ago Essential hypertension   [facility], [facility], FNP   1 year ago Essential hypertension   [facility], Kyung Rudd, NP   1 year ago Encounter for annual physical exam   [facility], Kyung Rudd, NP   2 years ago Essential hypertension   [facility], Kyung Rudd, NP      Future Appointments            In 1 week [name], Alison Stalling, FNP [facility], [facility]           Passed - Last BP in normal range    BP Readings from Last 1 Encounters:  09/30/19 114/69         Passed - Last Heart Rate in normal range    Pulse Readings from Last 1 Encounters:  09/30/19 60         . citalopram (CELEXA) 40 MG tablet [Pharmacy Med Name: Citalopram Hydrobromide 40 MG Oral Tablet] 90 tablet 0    Sig: TAKE 1 TABLET BY MOUTH  DAILY     Psychiatry:  Antidepressants - SSRI Failed - 07/19/2020  2:51 PM      Failed - Valid encounter within last 6 months    Recent Outpatient Visits          8 months ago Acute non-recurrent sinusitis, unspecified location   [facility], [facility], FNP   9 months ago Essential hypertension   [facility], [facility], FNP   1 year ago Essential hypertension   [facility], NP   1 year  ago Encounter for annual physical exam   [facility] Galen Manila, NP   2 years ago Essential hypertension   [facility] Kyung Rudd, Alison Stalling, NP      Future Appointments            In 1 week [name], Jodelle Gross, FNP [facility], [facility]           Passed - Completed PHQ-2 or PHQ-9 in the last 360 days      . losartan (COZAAR) 50 MG tablet [Pharmacy Med Name: Losartan Potassium 50 MG Oral Tablet] 90 tablet 3    Sig: TAKE 1 TABLET BY MOUTH  DAILY     Cardiovascular:  Angiotensin Receptor Blockers Failed - 07/19/2020  2:51 PM      Failed - Cr in normal range and within 180 days    Creat  Date Value Ref Range Status  12/28/2019 1.10 (H) 0.50 - 0.99 mg/dL Final    Comment:    For patients >9 years of age, the reference limit for Creatinine is approximately 13% higher for people identified as African-American. .          Failed - K in normal range and within 180 days    Potassium  Date Value Ref Range Status  12/28/2019 4.4 3.5 - 5.3 mmol/L Final         Failed - Valid encounter within  last 6 months    Recent Outpatient Visits          8 months ago Acute non-recurrent sinusitis, unspecified location   [facility], Jodelle Gross, FNP   9 months ago Essential hypertension   [facility], Jodelle Gross, FNP   1 year ago Essential hypertension   [facility] Galen Manila, NP   1 year ago Encounter for annual physical exam   [facility] Galen Manila, NP   2 years ago Essential hypertension   [facility] Galen Manila, NP      Future Appointments            In 1 week [name], Jodelle Gross, FNP [facility], [facility]           Passed - Patient is not pregnant      Passed - Last BP in normal range    BP Readings from Last 1 Encounters:  09/30/19 114/69         courtesy refills appointment 1 week

## 2020-07-19 NOTE — Telephone Encounter (Signed)
Requested medication (s) are due for refill today: yes  Requested medication (s) are on the active medication list: yes  Last refill:  09/30/19  #90  0 refills  Future visit scheduled: yes  Notes to clinic: CR and K need checked    Requested Prescriptions  Pending Prescriptions Disp Refills   losartan (COZAAR) 50 MG tablet [Pharmacy Med Name: Losartan Potassium 50 MG Oral Tablet] 90 tablet 3    Sig: TAKE 1 TABLET BY MOUTH  DAILY      Cardiovascular:  Angiotensin Receptor Blockers Failed - 07/19/2020  2:51 PM      Failed - Cr in normal range and within 180 days    Creat  Date Value Ref Range Status  12/28/2019 1.10 (H) 0.50 - 0.99 mg/dL Final    Comment:    For patients >29 years of age, the reference limit for Creatinine is approximately 13% higher for people identified as African-American. .           Failed - K in normal range and within 180 days    Potassium  Date Value Ref Range Status  12/28/2019 4.4 3.5 - 5.3 mmol/L Final          Failed - Valid encounter within last 6 months    Recent Outpatient Visits           8 months ago Acute non-recurrent sinusitis, unspecified location   [facility], Jodelle Gross, FNP   9 months ago Essential hypertension   [facility], Jodelle Gross, FNP   1 year ago Essential hypertension   [facility], Alison Stalling, NP   1 year ago Encounter for annual physical exam   [facility], Alison Stalling, NP   2 years ago Essential hypertension   [facility], Alison Stalling, NP       Future Appointments             In 1 week [name], Jodelle Gross, FNP [facility], [facility]             Passed - Patient is not pregnant      Passed - Last BP in normal range    BP Readings from Last 1 Encounters:  09/30/19 114/69           Signed Prescriptions Disp Refills   pantoprazole (PROTONIX) 20 MG tablet 90 tablet 0    Sig:  TAKE 1 TABLET BY MOUTH  DAILY      Gastroenterology: Proton Pump Inhibitors Passed - 07/19/2020  2:51 PM      Passed - Valid encounter within last 12 months    Recent Outpatient Visits           8 months ago Acute non-recurrent sinusitis, unspecified location   [facility], Jodelle Gross, FNP   9 months ago Essential hypertension   [facility], Jodelle Gross, FNP   1 year ago Essential hypertension   [facility], Alison Stalling, NP   1 year ago Encounter for annual physical exam   [facility] Gal[facility] [place], NP   2 years ago Essential hypertension   [facility], Alison Stalling, NP       Future Appointments             In 1 week [name], Jodelle Gross, FNP [facility], [facility]  propranolol ER (INDERAL LA) 120 MG 24 hr capsule 90 capsule 0    Sig: TAKE 1 CAPSULE BY MOUTH  DAILY      Cardiovascular:  Beta Blockers Failed - 07/19/2020  2:51 PM      Failed - Valid encounter within last 6 months    Recent Outpatient Visits           8 months ago Acute non-recurrent sinusitis, unspecified location   [facility], Jodelle Gross, FNP   9 months ago Essential hypertension   [facility], Jodelle Gross, FNP   1 year ago Essential hypertension   [facility] Kyung Rudd, Alison Stalling, NP   1 year ago Encounter for annual physical exam   [facility], Alison Stalling, NP   2 years ago Essential hypertension   [facility], Alison Stalling, NP       Future Appointments             In 1 week [name], Jodelle Gross, FNP [facility]             Passed - Last BP in normal range    BP Readings from Last 1 Encounters:  09/30/19 114/69          Passed - Last Heart Rate in normal range    Pulse Readings from Last 1 Encounters:  09/30/19 60             citalopram (CELEXA) 40 MG tablet 90 tablet 0    Sig: TAKE 1 TABLET BY MOUTH  DAILY      Psychiatry:  Antidepressants - SSRI Failed - 07/19/2020  2:51 PM      Failed - Valid encounter within last 6 months    Recent Outpatient Visits           8 months ago Acute non-recurrent sinusitis, unspecified location   [facility], Jodelle Gross, FNP   9 months ago Essential hypertension   [facility], Jodelle Gross, FNP   1 year ago Essential hypertension   [facility], Alison Stalling, NP   1 year ago Encounter for annual physical exam   [facility], Alison Stalling, NP   2 years ago Essential hypertension   [facility], Alison Stalling, NP       Future Appointments             In 1 week [name], Jodelle Gross, FNP [facility], [facility]             Passed - Completed PHQ-2 or PHQ-9 in the last 360 days

## 2020-07-25 ENCOUNTER — Encounter: Payer: Self-pay | Admitting: Family Medicine

## 2020-07-30 ENCOUNTER — Encounter: Payer: Self-pay | Admitting: Family Medicine

## 2020-07-31 ENCOUNTER — Other Ambulatory Visit: Payer: Self-pay

## 2020-07-31 DIAGNOSIS — K219 Gastro-esophageal reflux disease without esophagitis: Secondary | ICD-10-CM

## 2020-07-31 MED ORDER — PANTOPRAZOLE SODIUM 20 MG PO TBEC: 20.0000 mg | DELAYED_RELEASE_TABLET | Freq: Every day | ORAL | 0 refills | Status: DC

## 2020-08-01 ENCOUNTER — Encounter: Payer: Medicare Other | Admitting: Family Medicine

## 2020-08-14 ENCOUNTER — Encounter: Payer: Self-pay | Admitting: Family Medicine

## 2020-08-15 ENCOUNTER — Other Ambulatory Visit: Payer: Self-pay | Admitting: Family Medicine

## 2020-08-15 ENCOUNTER — Other Ambulatory Visit: Payer: Self-pay

## 2020-08-15 DIAGNOSIS — I10 Essential (primary) hypertension: Secondary | ICD-10-CM

## 2020-08-15 MED ORDER — ATORVASTATIN CALCIUM 10 MG PO TABS: 10.0000 mg | ORAL_TABLET | Freq: Every day | ORAL | 0 refills | Status: DC

## 2020-08-22 ENCOUNTER — Other Ambulatory Visit: Payer: Self-pay

## 2020-08-23 ENCOUNTER — Encounter: Payer: Medicare Other | Admitting: Family Medicine

## 2020-08-27 ENCOUNTER — Ambulatory Visit (INDEPENDENT_AMBULATORY_CARE_PROVIDER_SITE_OTHER): Payer: Medicare HMO | Admitting: Family Medicine

## 2020-08-27 ENCOUNTER — Encounter: Payer: Self-pay | Admitting: Family Medicine

## 2020-08-27 ENCOUNTER — Other Ambulatory Visit: Payer: Self-pay

## 2020-08-27 VITALS — BP 133/90 | HR 64 | Temp 97.3°F | Resp 17 | Ht 59.0 in | Wt 194.5 lb

## 2020-08-27 DIAGNOSIS — Z0000 Encounter for general adult medical examination without abnormal findings: Secondary | ICD-10-CM | POA: Insufficient documentation

## 2020-08-27 DIAGNOSIS — M1990 Unspecified osteoarthritis, unspecified site: Secondary | ICD-10-CM

## 2020-08-27 DIAGNOSIS — A630 Anogenital (venereal) warts: Secondary | ICD-10-CM | POA: Diagnosis not present

## 2020-08-27 DIAGNOSIS — Z1231 Encounter for screening mammogram for malignant neoplasm of breast: Secondary | ICD-10-CM | POA: Insufficient documentation

## 2020-08-27 DIAGNOSIS — Z23 Encounter for immunization: Secondary | ICD-10-CM | POA: Diagnosis not present

## 2020-08-27 DIAGNOSIS — Z13820 Encounter for screening for osteoporosis: Secondary | ICD-10-CM | POA: Diagnosis not present

## 2020-08-27 DIAGNOSIS — Z1211 Encounter for screening for malignant neoplasm of colon: Secondary | ICD-10-CM | POA: Insufficient documentation

## 2020-08-27 MED ORDER — DICLOFENAC SODIUM 1 % EX GEL: 2.0000 g | Freq: Four times a day (QID) | CUTANEOUS | 1 refills | Status: DC

## 2020-08-27 NOTE — Assessment & Plan Note (Signed)
Pt < age 65.  Needs annual influenza vaccine.  VIS provided.  Plan: 1. Administer Quad flu vaccine.  

## 2020-08-27 NOTE — Patient Instructions (Addendum)
A referral to OB/GYN for PAP testing and removal of genital wart has been placed today.  If you have not heard from the specialty office or our referral coordinator within 1 week, please let us know and we will follow up with the referral coordinator for an update.  As we discussed, have your labs drawn in the next 1-2 weeks and we will contact you with the results.  For Mammogram screening for breast cancer and DEXA Scan (Bone mineral density) screening for osteoporosis  Call the Imaging Center below anytime to schedule your own appointment now that order has been placed.  [facility] [address] [place], [place] [address] Phone: [phone]  [facility] [address] [place], [place] [address] Phone: [phone]   Well Visit: Care Instructions Overview  Well visits can help you stay healthy. Your provider has checked your overall health and may have suggested ways to take good care of yourself. Your provider also may have recommended tests. At home, you can help prevent illness with healthy eating, regular exercise, and other steps.  Follow-up care is a key part of your treatment and safety. Be sure to make and go to all appointments, and call your provider if you are having problems. It's also a good idea to know your test results and keep a list of the medicines you take.  How can you care for yourself at home?   Get screening tests that you and your doctor decide on. Screening helps find diseases before any symptoms appear.   Eat healthy foods. Choose fruits, vegetables, whole grains, protein, and low-fat dairy foods. Limit fat, especially saturated fat. Reduce salt in your diet.   Limit alcohol. If you are a man, have no more than 2 drinks a day or 14 drinks a week. If you are a woman, have no more than 1 drink a day or 7 drinks a week.   Get at least 30 minutes of physical activity on most days of  the week.  We recommend you go no more than 2 days in a row without exercise. Walking is a good choice. You also may want to do other activities, such as running, swimming, cycling, or playing tennis or team sports. Discuss any changes in your exercise program with your provider.   Reach and stay at a healthy weight. This will lower your risk for many problems, such as obesity, diabetes, heart disease, and high blood pressure.   Do not smoke or allow others to smoke around you. If you need help quitting, talk to your provider about stop-smoking programs and medicines. These can increase your chances of quitting for good.  Can call 1-800-QUIT-NOW (903-432-1322) for the [facility], assistance with smoking cessation.   Care for your mental health. It is easy to get weighed down by worry and stress. Learn strategies to manage stress, like deep breathing and mindfulness, and stay connected with your family and community. If you find you often feel sad or hopeless, talk with your provider. Treatment can help.   Talk to your provider about whether you have any risk factors for sexually transmitted infections (STIs). You can help prevent STIs if you wait to have sex with a new partner (or partners) until you've each been tested for STIs. It also helps if you use condoms (female or female condoms) and if you limit your sex partners to one person who only has sex with you. Vaccines are available  for some STIs, such as HPV (these are age dependent).   If you think you may have a problem with alcohol or drug use, talk to your provider. This includes prescription medicines (such as amphetamines and opioids) and illegal drugs (such as cocaine and methamphetamine). Your provider can help you figure out what type of treatment is best for you.   If you have concerns about domestic violence or intimate partner violence, there are resources available to you. National Domestic Abuse Hotline  (949)216-0117   Protect your skin from too much sun. When you're outdoors from 10 a.m. to 4 p.m., stay in the shade or cover up with clothing and a hat with a wide brim. Wear sunglasses that block UV rays. Even when it's cloudy, put broad-spectrum sunscreen (SPF 30 or higher) on any exposed skin.   See a dentist one or two times a year for checkups and to have your teeth cleaned.   See an eye doctor once per year for an eye exam.   Wear a seat belt in the car.  When should you call for help?  Watch closely for changes in your health, and be sure to contact your provider if you have any problems or symptoms that concern you.

## 2020-08-27 NOTE — Assessment & Plan Note (Signed)
Pt last mammogram 08/2019.  Result BI-RADS 1 - negative.  Plan: 1. Screening mammogram order placed.  Pt will call to schedule appointment.  Information given.

## 2020-08-27 NOTE — Assessment & Plan Note (Signed)
Pt postmenopausal with history of prior DEXA scan in 2018 showing osteopenia.  Plan: 1. Obtain DG bone density.

## 2020-08-27 NOTE — Progress Notes (Signed)
Subjective:    Patient ID: Elizabeth [name], female    DOB: 1955/09/25, 65 y.o.   MRN: [id number]  Alessandria Henken is a 65 y.o. female presenting on 08/27/2020 for Annual Exam and Hand Pain (Bilateral intermittent hand pain, mostly in the thumbs. Difficulty with gripping.The pt contribute the pain to arthritis. X 3 mths . The pain does not radiate. She describe it as a aching pain.)   HPI  HEALTH MAINTENANCE:  Weight/BMI: Obese, BMI 39.28% Diet: Regular Seatbelt: Always Sunscreen: As needed PAP: Due, declined today Mammogram: Completed 08/09/2019 - ordered DEXA: Last 2018, showing osteopenia, ordered Colon cancer screening: Completed 03/04/2013, due 03/05/2023 HIV & Hep C Screening: Offered and declined  GC/CT: Offered and declined  Optometry: Regularly Dentistry: Regularly  IMMUNIZATIONS: Tetanus: Up to date, 02/25/2013 Influenza: Given today COVID: Discussed  Depression screen [facility] 2/9 09/30/2019 04/05/2019 03/01/2019  Decreased Interest 0 0 0  Down, Depressed, Hopeless 0 0 0  PHQ - 2 Score 0 0 0  Altered sleeping - - 1  Tired, decreased energy - - 0  Change in appetite - - 0  Feeling bad or failure about yourself  - - 0  Trouble concentrating - - 0  Moving slowly or fidgety/restless - - 0  Suicidal thoughts - - 0  PHQ-9 Score - - 1  Difficult doing work/chores - - Not difficult at all    Past Medical History:  Diagnosis Date  . Allergy 08/04/1998  . Anxiety   . Arthritis 08/04/2008  . Depression   . GERD (gastroesophageal reflux disease)   . Hypertension 11/02/1997  . Insomnia   . Mixed hyperlipidemia   . Obesity    Past Surgical History:  Procedure Laterality Date  . CHOLECYSTECTOMY  2004   repeat 2014  . COLONOSCOPY  08/05/2011   Social History   Socioeconomic History  . Marital status: Widowed    Spouse name: Not on file  . Number of children: Not on file  . Years of education: Not on file  . Highest education level: High school graduate   Occupational History  . Not on file  Tobacco Use  . Smoking status: Current Every Day Smoker    Packs/day: 0.25    Years: 40.00    Pack years: 10.00    Types: Cigarettes  . Smokeless tobacco: Never Used  . Tobacco comment: pt smoking 8 cigs daily  Vaping Use  . Vaping Use: Never used  Substance and Sexual Activity  . Alcohol use: No    Comment: about once a month   . Drug use: No  . Sexual activity: Not Currently    Birth control/protection: Post-menopausal  Other Topics Concern  . Not on file  Social History Narrative  . Not on file   Social Determinants of Health   Financial Resource Strain: Not on file  Food Insecurity: Not on file  Transportation Needs: Not on file  Physical Activity: Not on file  Stress: Not on file  Social Connections: Not on file  Intimate Partner Violence: Not on file   Family History  Problem Relation Age of Onset  . Breast cancer Maternal Aunt 70  . Alzheimer's disease Mother   . Stroke Father   . Hypertension Father   . Multiple sclerosis Sister   . Hypertension Sister   . Early death Sister   . Colon cancer Sister   . Liver cancer Sister   . Breast cancer Cousin        first  cousin  . Parkinson's disease Paternal Uncle   . Early death Sister   . Hypertension Sister    Current Outpatient Medications on File Prior to Visit  Medication Sig  . acetaminophen (TYLENOL) 500 MG tablet Take 1,000 mg by mouth daily as needed.  . ARIPiprazole (ABILIFY) 5 MG tablet TAKE 1 TABLET BY MOUTH  DAILY  . aspirin 81 MG tablet Take 81 mg by mouth daily.  Marland Kitchen atorvastatin (LIPITOR) 10 MG tablet Take 1 tablet (10 mg total) by mouth daily.  . Blood Pressure Monitoring (SM WRIST CUFF BP MONITOR) MISC 1 Units by Does not apply route daily.  . cetirizine (ZYRTEC) 10 MG tablet Take 1 tablet (10 mg total) by mouth daily.  . citalopram (CELEXA) 40 MG tablet TAKE 1 TABLET BY MOUTH  DAILY  . Cyanocobalamin ([employer] B12 GUMMIES) 500 MCG CHEW Chew 1,000 mg by mouth  daily.   . cyclobenzaprine (FLEXERIL) 5 MG tablet TAKE 1 TABLET BY MOUTH 3  TIMES DAILY AS NEEDED FOR  MUSCLE SPASM(S)  . fluticasone (FLONASE) 50 MCG/ACT nasal spray USE 2 SPRAYS IN BOTH  NOSTRILS DAILY  . losartan (COZAAR) 50 MG tablet TAKE 1 TABLET BY MOUTH  DAILY  . Multiple Vitamin (MULTIVITAMIN) tablet Take 1 tablet by mouth daily.  . pantoprazole (PROTONIX) 20 MG tablet Take 1 tablet (20 mg total) by mouth daily.  . propranolol ER (INDERAL LA) 120 MG 24 hr capsule TAKE 1 CAPSULE BY MOUTH  DAILY  . Turmeric 500 MG CAPS Take by mouth.   No current facility-administered medications on file prior to visit.    Per HPI unless specifically indicated above     Objective:    BP 133/90 (BP Location: Right Arm, Patient Position: Sitting, Cuff Size: Normal)   Pulse 64   Temp (!) 97.3 F (36.3 C) (Temporal)   Resp 17   Ht 4\' 11"  (1.499 m)   Wt 194 lb 8 oz (88.2 kg)   SpO2 100%   BMI 39.28 kg/m   Wt Readings from Last 3 Encounters:  08/27/20 194 lb 8 oz (88.2 kg)  09/30/19 193 lb 1.6 oz (87.6 kg)  08/31/18 182 lb (82.6 kg)    Physical Exam Vitals and nursing note reviewed.  Constitutional:      General: She is not in acute distress.    Appearance: Normal appearance. She is well-developed and well-groomed. She is obese. She is not ill-appearing or toxic-appearing.  HENT:     Head: Normocephalic and atraumatic.     Right Ear: Tympanic membrane, ear canal and external ear normal. There is no impacted cerumen.     Left Ear: Tympanic membrane, ear canal and external ear normal. There is no impacted cerumen.     Nose: Nose normal. No congestion or rhinorrhea.     Mouth/Throat:     Lips: Pink.     Mouth: Mucous membranes are moist.     Pharynx: Oropharynx is clear. Uvula midline. No oropharyngeal exudate or posterior oropharyngeal erythema.  Eyes:     General: Lids are normal. Vision grossly intact. No scleral icterus.       Right eye: No discharge.        Left eye: No discharge.      Extraocular Movements: Extraocular movements intact.     Conjunctiva/sclera: Conjunctivae normal.     Pupils: Pupils are equal, round, and reactive to light.  Neck:     Thyroid: No thyroid mass or thyromegaly.  Cardiovascular:     Rate  and Rhythm: Normal rate and regular rhythm.     Pulses: Normal pulses.          Dorsalis pedis pulses are 2+ on the right side and 2+ on the left side.     Heart sounds: Normal heart sounds. No murmur heard. No friction rub. No gallop.   Pulmonary:     Effort: Pulmonary effort is normal. No respiratory distress.     Breath sounds: Normal breath sounds.  Abdominal:     General: Abdomen is flat. Bowel sounds are normal. There is no distension.     Palpations: Abdomen is soft. There is no hepatomegaly, splenomegaly or mass.     Tenderness: There is no abdominal tenderness. There is no guarding or rebound.     Hernia: No hernia is present.  Musculoskeletal:        General: Normal range of motion.     Cervical back: Normal range of motion and neck supple. No tenderness.     Right lower leg: No edema.     Left lower leg: No edema.     Comments: Normal tone, strength 5/5 BUE & BLE  Feet:     Right foot:     Skin integrity: Skin integrity normal.     Left foot:     Skin integrity: Skin integrity normal.  Lymphadenopathy:     Cervical: No cervical adenopathy.  Skin:    General: Skin is warm and dry.     Capillary Refill: Capillary refill takes less than 2 seconds.  Neurological:     General: No focal deficit present.     Mental Status: She is alert and oriented to person, place, and time.     Cranial Nerves: No cranial nerve deficit.     Sensory: No sensory deficit.     Motor: No weakness.     Coordination: Coordination normal.     Gait: Gait normal.     Deep Tendon Reflexes: Reflexes normal.  Psychiatric:        Attention and Perception: Attention and perception normal.        Mood and Affect: Mood and affect normal.        Speech: Speech  normal.        Behavior: Behavior normal. Behavior is cooperative.        Thought Content: Thought content normal.        Cognition and Memory: Cognition and memory normal.        Judgment: Judgment normal.     Results for orders placed or performed in visit on 12/20/19  COMPLETE METABOLIC PANEL WITH GFR  Result Value Ref Range   Glucose, Bld 98 65 - 99 mg/dL   BUN 11 7 - 25 mg/dL   Creat 1.611.10 (H) 0.960.50 - 0.99 mg/dL   GFR, Est Non African American 53 (L) > OR = 60 mL/min/1.4273m2   GFR, Est African American 61 > OR = 60 mL/min/1.5573m2   BUN/Creatinine Ratio 10 6 - 22 (calc)   Sodium 140 135 - 146 mmol/L   Potassium 4.4 3.5 - 5.3 mmol/L   Chloride 106 98 - 110 mmol/L   CO2 24 20 - 32 mmol/L   Calcium 9.9 8.6 - 10.4 mg/dL   Total Protein 6.4 6.1 - 8.1 g/dL   Albumin 4.0 3.6 - 5.1 g/dL   Globulin 2.4 1.9 - 3.7 g/dL (calc)   AG Ratio 1.7 1.0 - 2.5 (calc)   Total Bilirubin 0.4 0.2 - 1.2 mg/dL   Alkaline  phosphatase (APISO) 72 37 - 153 U/L   AST 14 10 - 35 U/L   ALT 13 6 - 29 U/L      Assessment & Plan:   Problem List Items Addressed This Visit      Musculoskeletal and Integument   Osteoarthritis    Will send in rx for diclofenac gel topically to apply PRN      Relevant Medications   acetaminophen (TYLENOL) 500 MG tablet   diclofenac Sodium (VOLTAREN) 1 % GEL   Genital warts    Hx of genital warts, requesting referral to OB/GYN with [facility], reports they had removed her warts in the past and would like to have her PAP done with their office.  Referral placed.      Relevant Orders   Ambulatory referral to Obstetrics / Gynecology     Other   Encounter for screening mammogram for malignant neoplasm of breast    Pt last mammogram 08/2019.  Result BI-RADS 1 - negative.  Plan: 1. Screening mammogram order placed.  Pt will call to schedule appointment.  Information given.       Relevant Orders   MM 3D SCREEN BREAST BILATERAL   Screening for osteoporosis    Pt  postmenopausal with history of prior DEXA scan in 2018 showing osteopenia.  Plan: 1. Obtain DG bone density.         Relevant Orders   DG Bone Density   Annual physical exam - Primary    Annual physical exam without new findings.  Well adult with no acute concerns.  Plan: 1. Obtain health maintenance screenings as above according to age. - Increase physical activity to 30 minutes most days of the week.  - Eat healthy diet high in vegetables and fruits; low in refined carbohydrates. - Screening labs and tests as ordered 2. Return 1 year for annual physical.       Relevant Orders   CBC with Differential   COMPLETE METABOLIC PANEL WITH GFR   Lipid Profile   TSH + free T4   Need for immunization against influenza    Pt < age 73.  Needs annual influenza vaccine.  VIS provided.  Plan: 1. Administer Quad flu vaccine.       Relevant Orders   Flu Vaccine QUAD 6+ mos PF IM (Fluarix Quad PF) (Completed)      Meds ordered this encounter  Medications  . diclofenac Sodium (VOLTAREN) 1 % GEL    Sig: Apply 2 g topically 4 (four) times daily.    Dispense:  100 g    Refill:  1    Follow up plan: Return in about 1 year (around 08/27/2021) for CPE.  Charlaine Dalton, FNP-C Family Nurse Practitioner [facility] 08/27/2020, 4:50 PM

## 2020-08-27 NOTE — Assessment & Plan Note (Signed)
Will send in rx for diclofenac gel topically to apply PRN

## 2020-08-27 NOTE — Assessment & Plan Note (Signed)
Hx of genital warts, requesting referral to OB/GYN with [facility], reports they had removed her warts in the past and would like to have her PAP done with their office.  Referral placed.

## 2020-08-27 NOTE — Assessment & Plan Note (Signed)
Annual physical exam without new findings.  Well adult with no acute concerns.  Plan: 1. Obtain health maintenance screenings as above according to age. - Increase physical activity to 30 minutes most days of the week.  - Eat healthy diet high in vegetables and fruits; low in refined carbohydrates. - Screening labs and tests as ordered 2. Return 1 year for annual physical.  

## 2020-08-28 ENCOUNTER — Encounter: Payer: Self-pay | Admitting: Family Medicine

## 2020-09-04 DIAGNOSIS — E782 Mixed hyperlipidemia: Secondary | ICD-10-CM | POA: Diagnosis not present

## 2020-09-04 DIAGNOSIS — Z0000 Encounter for general adult medical examination without abnormal findings: Secondary | ICD-10-CM | POA: Diagnosis not present

## 2020-09-05 LAB — CBC WITH DIFFERENTIAL/PLATELET
Absolute Monocytes: 511 cells/uL (ref 200–950)
Basophils Absolute: 79 cells/uL (ref 0–200)
Basophils Relative: 1.1 %
Eosinophils Absolute: 158 cells/uL (ref 15–500)
Eosinophils Relative: 2.2 %
HCT: 39.2 % (ref 35.0–45.0)
Hemoglobin: 13.5 g/dL (ref 11.7–15.5)
Lymphs Abs: 1699 cells/uL (ref 850–3900)
MCH: 32.8 pg (ref 27.0–33.0)
MCHC: 34.4 g/dL (ref 32.0–36.0)
MCV: 95.4 fL (ref 80.0–100.0)
MPV: 10.3 fL (ref 7.5–12.5)
Monocytes Relative: 7.1 %
Neutro Abs: 4752 cells/uL (ref 1500–7800)
Neutrophils Relative %: 66 %
Platelets: 355 10*3/uL (ref 140–400)
RBC: 4.11 10*6/uL (ref 3.80–5.10)
RDW: 11.4 % (ref 11.0–15.0)
Total Lymphocyte: 23.6 %
WBC: 7.2 10*3/uL (ref 3.8–10.8)

## 2020-09-05 LAB — LIPID PANEL
Cholesterol: 160 mg/dL (ref <200)
HDL: 52 mg/dL (ref >=50)
LDL Cholesterol (Calc): 91 mg/dL (calc)
Non-HDL Cholesterol (Calc): 108 mg/dL (calc) (ref <130)
Total CHOL/HDL Ratio: 3.1 (calc) (ref <5.0)
Triglycerides: 82 mg/dL (ref <150)

## 2020-09-05 LAB — COMPLETE METABOLIC PANEL WITH GFR
AG Ratio: 1.6 (calc) (ref 1.0–2.5)
ALT: 13 U/L (ref 6–29)
AST: 15 U/L (ref 10–35)
Albumin: 4 g/dL (ref 3.6–5.1)
Alkaline phosphatase (APISO): 77 U/L (ref 37–153)
BUN/Creatinine Ratio: 16 (calc) (ref 6–22)
BUN: 20 mg/dL (ref 7–25)
CO2: 24 mmol/L (ref 20–32)
Calcium: 10.2 mg/dL (ref 8.6–10.4)
Chloride: 109 mmol/L (ref 98–110)
Creat: 1.27 mg/dL — ABNORMAL HIGH (ref 0.50–0.99)
GFR, Est African American: 52 mL/min/{1.73_m2} — ABNORMAL LOW (ref >=60)
GFR, Est Non African American: 45 mL/min/{1.73_m2} — ABNORMAL LOW (ref >=60)
Globulin: 2.5 g/dL (calc) (ref 1.9–3.7)
Glucose, Bld: 97 mg/dL (ref 65–99)
Potassium: 4.3 mmol/L (ref 3.5–5.3)
Sodium: 139 mmol/L (ref 135–146)
Total Bilirubin: 0.4 mg/dL (ref 0.2–1.2)
Total Protein: 6.5 g/dL (ref 6.1–8.1)

## 2020-09-05 LAB — TSH+FREE T4: TSH W/REFLEX TO FT4: 2.46 mIU/L (ref 0.40–4.50)

## 2020-09-19 DIAGNOSIS — H524 Presbyopia: Secondary | ICD-10-CM | POA: Diagnosis not present

## 2020-09-21 ENCOUNTER — Other Ambulatory Visit: Payer: Self-pay

## 2020-09-21 DIAGNOSIS — M545 Low back pain, unspecified: Secondary | ICD-10-CM

## 2020-09-21 DIAGNOSIS — G8929 Other chronic pain: Secondary | ICD-10-CM

## 2020-09-21 MED ORDER — CYCLOBENZAPRINE HCL 5 MG PO TABS: 5.0000 mg | ORAL_TABLET | Freq: Three times a day (TID) | ORAL | 1 refills | Status: DC | PRN

## 2020-09-26 ENCOUNTER — Other Ambulatory Visit: Payer: Self-pay

## 2020-09-26 DIAGNOSIS — M545 Low back pain, unspecified: Secondary | ICD-10-CM

## 2020-09-26 DIAGNOSIS — G8929 Other chronic pain: Secondary | ICD-10-CM

## 2020-10-01 ENCOUNTER — Other Ambulatory Visit: Payer: Self-pay

## 2020-10-19 ENCOUNTER — Other Ambulatory Visit: Payer: Self-pay

## 2020-10-19 DIAGNOSIS — K219 Gastro-esophageal reflux disease without esophagitis: Secondary | ICD-10-CM

## 2020-10-19 MED ORDER — PANTOPRAZOLE SODIUM 20 MG PO TBEC: 20.0000 mg | DELAYED_RELEASE_TABLET | Freq: Every day | ORAL | 1 refills | Status: DC

## 2020-10-29 ENCOUNTER — Other Ambulatory Visit: Payer: Self-pay

## 2020-10-29 ENCOUNTER — Telehealth: Payer: Self-pay | Admitting: Family Medicine

## 2020-10-29 MED ORDER — ATORVASTATIN CALCIUM 10 MG PO TABS: 10.0000 mg | ORAL_TABLET | Freq: Every day | ORAL | 1 refills | Status: DC

## 2020-10-29 NOTE — Telephone Encounter (Signed)
Copied from CRM [id number]. Topic: Medicare AWV >> Oct 29, 2020 10:13 AM Claudette Laws R wrote: Reason for CRM: Left message for patient to call back and schedule the Medicare Annual Wellness Visit (AWV) virtually or by telephone.  Last AWV 04/05/2019  Please schedule at anytime with [facility] Advisor.  40 minute appointment  Any questions, please call me at [phone]

## 2020-10-31 ENCOUNTER — Ambulatory Visit
Admission: RE | Admit: 2020-10-31 | Discharge: 2020-10-31 | Disposition: A | Discharge status: Home / Self Care | Payer: Medicare HMO | Source: Ambulatory Visit | Attending: Family Medicine | Admitting: Family Medicine

## 2020-10-31 ENCOUNTER — Other Ambulatory Visit: Payer: Self-pay

## 2020-10-31 DIAGNOSIS — Z1231 Encounter for screening mammogram for malignant neoplasm of breast: Secondary | ICD-10-CM | POA: Insufficient documentation

## 2020-10-31 DIAGNOSIS — F17200 Nicotine dependence, unspecified, uncomplicated: Secondary | ICD-10-CM | POA: Diagnosis not present

## 2020-10-31 DIAGNOSIS — M85851 Other specified disorders of bone density and structure, right thigh: Secondary | ICD-10-CM | POA: Diagnosis not present

## 2020-10-31 DIAGNOSIS — Z13820 Encounter for screening for osteoporosis: Secondary | ICD-10-CM | POA: Insufficient documentation

## 2020-10-31 DIAGNOSIS — Z780 Asymptomatic menopausal state: Secondary | ICD-10-CM | POA: Insufficient documentation

## 2020-11-05 DIAGNOSIS — Z0100 Encounter for examination of eyes and vision without abnormal findings: Secondary | ICD-10-CM | POA: Diagnosis not present

## 2020-12-05 ENCOUNTER — Other Ambulatory Visit: Payer: Self-pay

## 2020-12-05 DIAGNOSIS — F419 Anxiety disorder, unspecified: Secondary | ICD-10-CM

## 2020-12-05 MED ORDER — ARIPIPRAZOLE 5 MG PO TABS
5.0000 mg | ORAL_TABLET | Freq: Every day | ORAL | 0 refills | Status: DC
Start: 2020-12-05 — End: 2021-03-04

## 2020-12-07 ENCOUNTER — Other Ambulatory Visit: Payer: Self-pay

## 2020-12-07 DIAGNOSIS — F419 Anxiety disorder, unspecified: Secondary | ICD-10-CM

## 2020-12-12 DIAGNOSIS — G2119 Other drug induced secondary parkinsonism: Secondary | ICD-10-CM | POA: Diagnosis not present

## 2020-12-12 DIAGNOSIS — E538 Deficiency of other specified B group vitamins: Secondary | ICD-10-CM | POA: Diagnosis not present

## 2021-01-11 ENCOUNTER — Other Ambulatory Visit: Payer: Self-pay | Admitting: Family Medicine

## 2021-01-11 DIAGNOSIS — K219 Gastro-esophageal reflux disease without esophagitis: Secondary | ICD-10-CM

## 2021-01-30 ENCOUNTER — Other Ambulatory Visit: Payer: Self-pay

## 2021-01-30 DIAGNOSIS — I10 Essential (primary) hypertension: Secondary | ICD-10-CM

## 2021-01-30 DIAGNOSIS — G252 Other specified forms of tremor: Secondary | ICD-10-CM

## 2021-01-30 DIAGNOSIS — F419 Anxiety disorder, unspecified: Secondary | ICD-10-CM

## 2021-01-31 MED ORDER — CITALOPRAM HYDROBROMIDE 40 MG PO TABS: 40.0000 mg | ORAL_TABLET | Freq: Every day | ORAL | 1 refills | Status: DC

## 2021-01-31 MED ORDER — PROPRANOLOL HCL ER 120 MG PO CP24: 120.0000 mg | ORAL_CAPSULE | Freq: Every day | ORAL | 1 refills | Status: DC

## 2021-01-31 MED ORDER — LOSARTAN POTASSIUM 50 MG PO TABS: 50.0000 mg | ORAL_TABLET | Freq: Every day | ORAL | 1 refills | Status: DC

## 2021-03-04 ENCOUNTER — Other Ambulatory Visit: Payer: Self-pay

## 2021-03-04 ENCOUNTER — Ambulatory Visit (INDEPENDENT_AMBULATORY_CARE_PROVIDER_SITE_OTHER): Payer: Medicare HMO | Admitting: Internal Medicine

## 2021-03-04 ENCOUNTER — Encounter: Payer: Self-pay | Admitting: Internal Medicine

## 2021-03-04 VITALS — BP 160/68 | HR 54 | Temp 97.8°F | Resp 18 | Ht 59.0 in | Wt 186.4 lb

## 2021-03-04 DIAGNOSIS — N1831 Chronic kidney disease, stage 3a: Secondary | ICD-10-CM

## 2021-03-04 DIAGNOSIS — F39 Unspecified mood [affective] disorder: Secondary | ICD-10-CM

## 2021-03-04 DIAGNOSIS — G2119 Other drug induced secondary parkinsonism: Secondary | ICD-10-CM

## 2021-03-04 DIAGNOSIS — M85851 Other specified disorders of bone density and structure, right thigh: Secondary | ICD-10-CM | POA: Diagnosis not present

## 2021-03-04 DIAGNOSIS — K219 Gastro-esophageal reflux disease without esophagitis: Secondary | ICD-10-CM | POA: Diagnosis not present

## 2021-03-04 DIAGNOSIS — I10 Essential (primary) hypertension: Secondary | ICD-10-CM | POA: Diagnosis not present

## 2021-03-04 DIAGNOSIS — E782 Mixed hyperlipidemia: Secondary | ICD-10-CM | POA: Diagnosis not present

## 2021-03-04 DIAGNOSIS — G8929 Other chronic pain: Secondary | ICD-10-CM

## 2021-03-04 DIAGNOSIS — M545 Low back pain, unspecified: Secondary | ICD-10-CM

## 2021-03-04 DIAGNOSIS — M1990 Unspecified osteoarthritis, unspecified site: Secondary | ICD-10-CM

## 2021-03-04 DIAGNOSIS — N183 Chronic kidney disease, stage 3 unspecified: Secondary | ICD-10-CM

## 2021-03-04 DIAGNOSIS — Z131 Encounter for screening for diabetes mellitus: Secondary | ICD-10-CM | POA: Diagnosis not present

## 2021-03-04 DIAGNOSIS — F419 Anxiety disorder, unspecified: Secondary | ICD-10-CM | POA: Diagnosis not present

## 2021-03-04 HISTORY — DX: Chronic kidney disease, stage 3 unspecified: N18.30

## 2021-03-04 MED ORDER — ARIPIPRAZOLE 5 MG PO TABS: 5.0000 mg | ORAL_TABLET | Freq: Every day | ORAL | 1 refills | Status: DC

## 2021-03-04 NOTE — Assessment & Plan Note (Signed)
Elevated today Forgot to recheck manually Continue losartan and propanolol as prescribed Reinforced DASH diet and exercise for weight loss

## 2021-03-04 NOTE — Assessment & Plan Note (Signed)
Continue turmeric, Voltaren gel, Tylenol and Flexeril Encourage exercise for weight loss as this can produce joint pain Encouraged daily stretching and core strengthening 

## 2021-03-04 NOTE — Assessment & Plan Note (Signed)
Try to avoid foods that trigger reflux Encourage weight loss as this can help reduce reflux symptoms Continue pantoprazole

## 2021-03-04 NOTE — Patient Instructions (Signed)
Heart-Healthy Eating Plan Heart-healthy meal planning includes: Eating less unhealthy fats. Eating more healthy fats. Making other changes in your diet. Talk with your doctor or a diet specialist (dietitian) to create an eating plan that is right for you. What is my plan? Your doctor may recommend an eating plan that includes: Total fat: ______% or less of total calories a day. Saturated fat: ______% or less of total calories a day. Cholesterol: less than _________mg a day. What are tips for following this plan? Cooking Avoid frying your food. Try to bake, boil, grill, or broil it instead. You can also reduce fat by: Removing the skin from poultry. Removing all visible fats from meats. Steaming vegetables in water or broth. Meal planning  At meals, divide your plate into four equal parts: Fill one-half of your plate with vegetables and green salads. Fill one-fourth of your plate with whole grains. Fill one-fourth of your plate with lean protein foods. Eat 4-5 servings of vegetables per day. A serving of vegetables is: 1 cup of raw or cooked vegetables. 2 cups of raw leafy greens. Eat 4-5 servings of fruit per day. A serving of fruit is: 1 medium whole fruit.  cup of dried fruit.  cup of fresh, frozen, or canned fruit.  cup of 100% fruit juice. Eat more foods that have soluble fiber. These are apples, broccoli, carrots, beans, peas, and barley. Try to get 20-30 g of fiber per day. Eat 4-5 servings of nuts, legumes, and seeds per week: 1 serving of dried beans or legumes equals  cup after being cooked. 1 serving of nuts is  cup. 1 serving of seeds equals 1 tablespoon.  General information Eat more home-cooked food. Eat less restaurant, buffet, and fast food. Limit or avoid alcohol. Limit foods that are high in starch and sugar. Avoid fried foods. Lose weight if you are overweight. Keep track of how much salt (sodium) you eat. This is important if you have high blood  pressure. Ask your doctor to tell you more about this. Try to add vegetarian meals each week. Fats Choose healthy fats. These include olive oil and canola oil, flaxseeds, walnuts, almonds, and seeds. Eat more omega-3 fats. These include salmon, mackerel, sardines, tuna, flaxseed oil, and ground flaxseeds. Try to eat fish at least 2 times each week. Check food labels. Avoid foods with trans fats or high amounts of saturated fat. Limit saturated fats. These are often found in animal products, such as meats, butter, and cream. These are also found in plant foods, such as palm oil, palm kernel oil, and coconut oil. Avoid foods with partially hydrogenated oils in them. These have trans fats. Examples are stick margarine, some tub margarines, cookies, crackers, and other baked goods. What foods can I eat? Fruits All fresh, canned (in natural juice), or frozen fruits. Vegetables Fresh or frozen vegetables (raw, steamed, roasted, or grilled). Green salads. Grains Most grains. Choose whole wheat and whole grains most of the time. Rice andpasta, including brown rice and pastas made with whole wheat. Meats and other proteins Lean, well-trimmed beef, veal, pork, and lamb. Chicken and turkey without skin. All fish and shellfish. Wild duck, rabbit, pheasant, and venison. Egg whites or low-cholesterol egg substitutes. Dried beans, peas, lentils, and tofu. Seedsand most nuts. Dairy Low-fat or nonfat cheeses, including ricotta and mozzarella. Skim or 1% milk that is liquid, powdered, or evaporated. Buttermilk that is made with low-fatmilk. Nonfat or low-fat yogurt. Fats and oils Non-hydrogenated (trans-free) margarines. Vegetable oils, including soybean, sesame,   sunflower, olive, peanut, safflower, corn, canola, and cottonseed. Salad dressings or mayonnaisemade with a vegetable oil. Beverages Mineral water. Coffee and tea. Diet carbonated beverages. Sweets and desserts Sherbet, gelatin, and fruit ice. Small  amounts of dark chocolate. Limit all sweets and desserts. Seasonings and condiments All seasonings and condiments. The items listed above may not be a complete list of foods and drinks you can eat. Contact a dietitian for more options. What foods should I avoid? Fruits Canned fruit in heavy syrup. Fruit in cream or butter sauce. Fried fruit. Limitcoconut. Vegetables Vegetables cooked in cheese, cream, or butter sauce. Fried vegetables. Grains Breads that are made with saturated or trans fats, oils, or whole milk. Croissants. Sweet rolls. Donuts. High-fat crackers,such as cheese crackers. Meats and other proteins Fatty meats, such as hot dogs, ribs, sausage, bacon, rib-eye roast or steak. High-fat deli meats, such as salami and bologna. Caviar. Domestic duck andgoose. Organ meats, such as liver. Dairy Cream, sour cream, cream cheese, and creamed cottage cheese. Whole-milk cheeses. Whole or 2% milk that is liquid, evaporated, or condensed. Whole buttermilk. Cream sauce or high-fat cheese sauce. Yogurt that is made fromwhole milk. Fats and oils Meat fat, or shortening. Cocoa butter, hydrogenated oils, palm oil, coconut oil, palm kernel oil. Solid fats and shortenings, including bacon fat, salt pork, lard, and butter. Nondairy cream substitutes. Salad dressings with cheeseor sour cream. Beverages Regular sodas and juice drinks with added sugar. Sweets and desserts Frosting. Pudding. Cookies. Cakes. Pies. Milk chocolate or white chocolate.Buttered syrups. Full-fat ice cream or ice cream drinks. The items listed above may not be a complete list of foods and drinks to avoid. Contact a dietitian for more information. Summary Heart-healthy meal planning includes eating less unhealthy fats, eating more healthy fats, and making other changes in your diet. Eat a balanced diet. This includes fruits and vegetables, low-fat or nonfat dairy, lean protein, nuts and legumes, whole grains, and heart-healthy  oils and fats. This information is not intended to replace advice given to you by your health care provider. Make sure you discuss any questions you have with your healthcare provider. Document Revised: 09/24/2017 Document Reviewed: 08/28/2017 Elsevier Patient Education  2022 Elsevier Inc.  

## 2021-03-04 NOTE — Assessment & Plan Note (Signed)
Continue vitamin D and calcium OTC Encouraged daily weightbearing exercise

## 2021-03-04 NOTE — Assessment & Plan Note (Signed)
Continue losartan for renal protection C-Met today 

## 2021-03-04 NOTE — Assessment & Plan Note (Signed)
Continue turmeric, Voltaren gel, Tylenol and Flexeril Encourage exercise for weight loss as this can produce joint pain Encouraged daily stretching and core strengthening

## 2021-03-04 NOTE — Progress Notes (Signed)
Subjective:    Patient ID: Elizabeth [name], female    DOB: [birth year], 65 y.o.   MRN: [id number]  HPI  Pt presents to the clinic today for follow up of chronic conditions. She is establishing care with me today, transferring care from [facility], NP.  HTN: Her BP today is 160/68. She is taking  Losartan and Propranolol as prescribed. There is no ECG on file.  HLD: Her last LDL was 91, triglycerides 82, 2/022. She denies myalgias on Atorvastatin. She tries to consume a low fat diet.  Anxiety and Depression: Chronic, managed on Citalopram and Abilify. She is not currently seeing a therapist. She denies SI/HI.  Chronic Back Pain: Managed on Tumeric, Voltaren Gel, Tylenol and Flexeril. She does not follow with orthopedics.  GERD: Triggered by spicy foods. She denies breakthrough on Pantoprazole. There is no upper GI on file.  OA: Mainly in her hands. She takes Tumeric, Voltaren Gel, Tylenol and Flexeril.as needed with good relief of symptoms.   Osteopenia: Bone density from 10/2020 reviewed. She is taking Calcium and Vit D OTC. She tries to get 30 minutes of weight bearing exercise in daily.  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 Managed with Propranolol. She follows with neurology.  CKD 3: Her last creatinine was 1.27, GFR 45, 09/2020. She is taking Losartan for renal protection. She does not follow with nephrology.  Review of Systems     Past Medical History:  Diagnosis Date   Allergy 08/04/1998   Anxiety    Arthritis 08/04/2008   Depression    GERD (gastroesophageal reflux disease)    Hypertension 11/02/1997   Insomnia    Mixed hyperlipidemia    Obesity     Current Outpatient Medications  Medication Sig Dispense Refill   acetaminophen (TYLENOL) 500 MG tablet Take 1,000 mg by mouth daily as needed.     ARIPiprazole (ABILIFY) 5 MG tablet Take 1 tablet (5 mg total) by mouth daily. 90 tablet 0   aspirin 81 MG tablet Take 81 mg by mouth daily.     atorvastatin  (LIPITOR) 10 MG tablet Take 1 tablet (10 mg total) by mouth daily. 90 tablet 1   Blood Pressure Monitoring (SM WRIST CUFF BP MONITOR) MISC 1 Units by Does not apply route daily. 1 each 0   cetirizine (ZYRTEC) 10 MG tablet Take 1 tablet (10 mg total) by mouth daily. 30 tablet 11   citalopram (CELEXA) 40 MG tablet Take 1 tablet (40 mg total) by mouth daily. 90 tablet 1   Cyanocobalamin ([employer] B12 GUMMIES) 500 MCG CHEW Chew 1,000 mg by mouth daily.      cyclobenzaprine (FLEXERIL) 5 MG tablet Take 1 tablet (5 mg total) by mouth 3 (three) times daily as needed for muscle spasms. 90 tablet 1   diclofenac Sodium (VOLTAREN) 1 % GEL Apply 2 g topically 4 (four) times daily. 100 g 1   fluticasone (FLONASE) 50 MCG/ACT nasal spray USE 2 SPRAYS IN BOTH  NOSTRILS DAILY 48 g 1   losartan (COZAAR) 50 MG tablet Take 1 tablet (50 mg total) by mouth daily. 90 tablet 1   Multiple Vitamin (MULTIVITAMIN) tablet Take 1 tablet by mouth daily.     pantoprazole (PROTONIX) 20 MG tablet Take 1 tablet (20 mg total) by mouth daily. 90 tablet 1   propranolol ER (INDERAL LA) 120 MG 24 hr capsule Take 1 capsule (120 mg total) by mouth daily. 90 capsule 1   Turmeric 500 MG CAPS Take by mouth.  No current facility-administered medications for this visit.    Allergies  Allergen Reactions   Ace Inhibitors Cough    Family History  Problem Relation Age of Onset   Breast cancer Maternal Aunt 28   Alzheimer's disease Mother    Stroke Father    Hypertension Father    Multiple sclerosis Sister    Hypertension Sister    Early death Sister    Colon cancer Sister    Liver cancer Sister    Breast cancer Cousin        mat cousin   Parkinson's disease Paternal Uncle    Early death Sister    Hypertension Sister     Social History   Socioeconomic History   Marital status: Widowed    Spouse name: Not on file   Number of children: Not on file   Years of education: Not on file   Highest education level: High school  graduate  Occupational History   Not on file  Tobacco Use   Smoking status: Every Day    Packs/day: 0.25    Years: 40.00    Pack years: 10.00    Types: Cigarettes   Smokeless tobacco: Never   Tobacco comments:    pt smoking 8 cigs daily  Vaping Use   Vaping Use: Never used  Substance and Sexual Activity   Alcohol use: No    Comment: about once a month    Drug use: No   Sexual activity: Not Currently    Birth control/protection: Post-menopausal  Other Topics Concern   Not on file  Social History Narrative   Not on file   Social Determinants of Health   Financial Resource Strain: Not on file  Food Insecurity: Not on file  Transportation Needs: Not on file  Physical Activity: Not on file  Stress: Not on file  Social Connections: Not on file  Intimate Partner Violence: Not on file     Constitutional: Denies fever, malaise, fatigue, headache or abrupt weight changes.  HEENT: Pt reports swelling of her left lower eyelid. Denies eye pain, eye redness, ear pain, ringing in the ears, wax buildup, runny nose, nasal congestion, bloody nose, or sore throat. Respiratory: Denies difficulty breathing, shortness of breath, cough or sputum production.   Cardiovascular: Denies chest pain, chest tightness, palpitations or swelling in the hands or feet.  Gastrointestinal: Denies abdominal pain, bloating, constipation, diarrhea or blood in the stool.  GU: Denies urgency, frequency, pain with urination, burning sensation, blood in urine, odor or discharge. Musculoskeletal: Pt reports chronic joint pain. Denies decrease in range of motion, difficulty with gait, or joint welling.  Skin: Denies redness, rashes, lesions or ulcercations.  Neurological: Pt reports resting tremor. Denies dizziness, difficulty with memory, difficulty with speech or problems with balance and coordination.  Psych: Pt has a history of anxiety and depression. Denies SI/HI.  No other specific complaints in a complete  review of systems (except as listed in HPI above).  Objective:   Physical Exam  BP (!) 160/68 (BP Location: Left Arm, Patient Position: Sitting, Cuff Size: Normal)   Pulse (!) 54   Temp 97.8 F (36.6 C) (Temporal)   Resp 18   Ht 4\' 11"  (1.499 m)   Wt 186 lb 6.4 oz (84.6 kg)   SpO2 98%   BMI 37.65 kg/m   Wt Readings from Last 3 Encounters:  08/27/20 194 lb 8 oz (88.2 kg)  09/30/19 193 lb 1.6 oz (87.6 kg)  08/31/18 182 lb (82.6 kg)  General: Appears her stated age, obese, in NAD. Skin: Warm, dry and intact. No ulcerations noted. HEENT: Head: normal shape and size; Eyes: sclera white and EOMs intact, stye noted of left lower eyelid;  Cardiovascular: Normal rate and rhythm. S1,S2 noted.  No murmur, rubs or gallops noted. No JVD or BLE edema. No carotid bruits noted. Pulmonary/Chest: Normal effort and positive vesicular breath sounds. No respiratory distress. No wheezes, rales or ronchi noted.  Abdomen: Soft and nontender. Normal bowel sounds.  Musculoskeletal: No difficulty with gait.  Neurological: Alert and oriented.  Psychiatric: Mood and affect normal. Behavior is normal. Judgment and thought content normal.    BMET    Component Value Date/Time   NA 139 09/04/2020 0941   NA 145 (H) 04/09/2017 1431   K 4.3 09/04/2020 0941   CL 109 09/04/2020 0941   CO2 24 09/04/2020 0941   GLUCOSE 97 09/04/2020 0941   BUN 20 09/04/2020 0941   BUN 6 (L) 04/09/2017 1431   CREATININE 1.27 (H) 09/04/2020 0941   CALCIUM 10.2 09/04/2020 0941   GFRNONAA 45 (L) 09/04/2020 0941   GFRAA 52 (L) 09/04/2020 0941    Lipid Panel     Component Value Date/Time   CHOL 160 09/04/2020 0941   CHOL 253 (H) 08/08/2015 1323   TRIG 82 09/04/2020 0941   HDL 52 09/04/2020 0941   HDL 58 08/08/2015 1323   CHOLHDL 3.1 09/04/2020 0941   VLDL 27 03/03/2017 1058   LDLCALC 91 09/04/2020 0941    CBC    Component Value Date/Time   WBC 7.2 09/04/2020 0941   RBC 4.11 09/04/2020 0941   HGB 13.5  09/04/2020 0941   HGB 11.6 04/09/2017 1431   HCT 39.2 09/04/2020 0941   HCT 34.5 04/09/2017 1431   PLT 355 09/04/2020 0941   PLT 376 04/09/2017 1431   MCV 95.4 09/04/2020 0941   MCV 92 04/09/2017 1431   MCH 32.8 09/04/2020 0941   MCHC 34.4 09/04/2020 0941   RDW 11.4 09/04/2020 0941   RDW 13.7 04/09/2017 1431   LYMPHSABS 1,699 09/04/2020 0941   LYMPHSABS 1.6 04/09/2017 1431   MONOABS 385 03/03/2017 1058   EOSABS 158 09/04/2020 0941   EOSABS 0.2 04/09/2017 1431   BASOSABS 79 09/04/2020 0941   BASOSABS 0.0 04/09/2017 1431    Hgb A1C Lab Results  Component Value Date   HGBA1C 5.3 09/07/2018            Assessment & Plan:   Stye, Left Lower Lid:  Encouraged warm compresses TID  RTC in 6 months for your annual exam Nicki Reaper, NP This visit occurred during the SARS-CoV-2 public health emergency.  Safety protocols were in place, including screening questions prior to the visit, additional usage of staff PPE, and extensive cleaning of exam room while observing appropriate contact time as indicated for disinfecting solutions.

## 2021-03-04 NOTE — Assessment & Plan Note (Signed)
C-Met and lipid profile today Encouraged her to consume a low-fat diet Continue atorvastatin 

## 2021-03-04 NOTE — Assessment & Plan Note (Signed)
-  Continue propranolol 

## 2021-03-04 NOTE — Assessment & Plan Note (Signed)
Stable on Citalopram and Abilify Support offered

## 2021-03-05 LAB — LIPID PANEL
Cholesterol: 165 mg/dL (ref <200)
HDL: 53 mg/dL (ref >=50)
LDL Cholesterol (Calc): 89 mg/dL (calc)
Non-HDL Cholesterol (Calc): 112 mg/dL (calc) (ref <130)
Total CHOL/HDL Ratio: 3.1 (calc) (ref <5.0)
Triglycerides: 125 mg/dL (ref <150)

## 2021-03-05 LAB — COMPREHENSIVE METABOLIC PANEL
AG Ratio: 1.7 (calc) (ref 1.0–2.5)
ALT: 12 U/L (ref 6–29)
AST: 15 U/L (ref 10–35)
Albumin: 4.2 g/dL (ref 3.6–5.1)
Alkaline phosphatase (APISO): 74 U/L (ref 37–153)
BUN/Creatinine Ratio: 9 (calc) (ref 6–22)
BUN: 10 mg/dL (ref 7–25)
CO2: 24 mmol/L (ref 20–32)
Calcium: 9.8 mg/dL (ref 8.6–10.4)
Chloride: 107 mmol/L (ref 98–110)
Creat: 1.15 mg/dL — ABNORMAL HIGH (ref 0.50–1.05)
Globulin: 2.5 g/dL (calc) (ref 1.9–3.7)
Glucose, Bld: 93 mg/dL (ref 65–99)
Potassium: 4.4 mmol/L (ref 3.5–5.3)
Sodium: 140 mmol/L (ref 135–146)
Total Bilirubin: 0.5 mg/dL (ref 0.2–1.2)
Total Protein: 6.7 g/dL (ref 6.1–8.1)

## 2021-03-05 LAB — HEMOGLOBIN A1C
Hgb A1c MFr Bld: 5.2 % of total Hgb (ref <5.7)
Mean Plasma Glucose: 103 mg/dL
eAG (mmol/L): 5.7 mmol/L

## 2021-03-25 ENCOUNTER — Other Ambulatory Visit: Payer: Self-pay | Admitting: Family Medicine

## 2021-05-06 ENCOUNTER — Encounter: Payer: Self-pay | Admitting: Internal Medicine

## 2021-05-17 ENCOUNTER — Encounter: Payer: Self-pay | Admitting: Internal Medicine

## 2021-05-22 DIAGNOSIS — A630 Anogenital (venereal) warts: Secondary | ICD-10-CM | POA: Diagnosis not present

## 2021-05-22 DIAGNOSIS — Z124 Encounter for screening for malignant neoplasm of cervix: Secondary | ICD-10-CM | POA: Diagnosis not present

## 2021-05-22 LAB — HM PAP SMEAR: HM Pap smear: NORMAL

## 2021-06-24 ENCOUNTER — Other Ambulatory Visit: Payer: Self-pay | Admitting: Internal Medicine

## 2021-06-24 DIAGNOSIS — I10 Essential (primary) hypertension: Secondary | ICD-10-CM

## 2021-06-24 NOTE — Telephone Encounter (Signed)
Requested Prescriptions  Pending Prescriptions Disp Refills  . losartan (COZAAR) 50 MG tablet [Pharmacy Med Name: LOSARTAN POTASSIUM 50 MG Tablet] 90 tablet 0    Sig: TAKE 1 TABLET EVERY DAY     Cardiovascular:  Angiotensin Receptor Blockers Failed - 06/24/2021  5:00 AM      Failed - Cr in normal range and within 180 days    Creat  Date Value Ref Range Status  03/04/2021 1.15 (H) 0.50 - 1.05 mg/dL Final         Failed - Last BP in normal range    BP Readings from Last 1 Encounters:  03/04/21 (!) 160/68         Passed - K in normal range and within 180 days    Potassium  Date Value Ref Range Status  03/04/2021 4.4 3.5 - 5.3 mmol/L Final         Passed - Patient is not pregnant      Passed - Valid encounter within last 6 months    Recent Outpatient Visits          3 months ago Chronic bilateral low back pain without sciatica   [facility] [place], Salvadore Oxford, NP   10 months ago Annual physical exam   [facility], Jodelle Gross, FNP   1 year ago Acute non-recurrent sinusitis, unspecified location   [facility], Jodelle Gross, FNP   1 year ago Essential hypertension   [facility], Jodelle Gross, FNP   2 years ago Essential hypertension   [facility] [place], NP      Future Appointments            In 2 months [name], Salvadore Oxford, NP [facility], [facility]

## 2021-07-08 ENCOUNTER — Other Ambulatory Visit: Payer: Self-pay | Admitting: Internal Medicine

## 2021-07-08 DIAGNOSIS — F419 Anxiety disorder, unspecified: Secondary | ICD-10-CM

## 2021-07-08 NOTE — Telephone Encounter (Signed)
Requested Prescriptions  Pending Prescriptions Disp Refills  . citalopram (CELEXA) 40 MG tablet [Pharmacy Med Name: CITALOPRAM HYDROBROMIDE 40 MG Tablet] 90 tablet 1    Sig: TAKE 1 TABLET EVERY DAY     Psychiatry:  Antidepressants - SSRI Passed - 07/08/2021  3:44 AM      Passed - Valid encounter within last 6 months    Recent Outpatient Visits          4 months ago Chronic bilateral low back pain without sciatica   [facility] [place], Salvadore Oxford, NP   10 months ago Annual physical exam   [facility], Jodelle Gross, FNP   1 year ago Acute non-recurrent sinusitis, unspecified location   [facility], Jodelle Gross, FNP   1 year ago Essential hypertension   [facility], Jodelle Gross, FNP   2 years ago Essential hypertension   [facility] [place], NP      Future Appointments            In 2 months [name], Salvadore Oxford, NP [facility], [facility]

## 2021-07-17 ENCOUNTER — Other Ambulatory Visit: Payer: Self-pay | Admitting: Internal Medicine

## 2021-07-17 ENCOUNTER — Other Ambulatory Visit: Payer: Self-pay | Admitting: Family Medicine

## 2021-07-17 DIAGNOSIS — G252 Other specified forms of tremor: Secondary | ICD-10-CM

## 2021-07-17 DIAGNOSIS — K219 Gastro-esophageal reflux disease without esophagitis: Secondary | ICD-10-CM

## 2021-07-17 NOTE — Telephone Encounter (Signed)
Medication Refill - Medication: pantoprazole (PROTONIX) 20 MG tablet  Has the patient contacted their pharmacy? Yes.    (Agent: If yes, when and what did the pharmacy advise?) Contact PCP office because 2 faxes have been sent my pharmacy  Preferred Pharmacy (with phone number or street name):  [facility] - [place], [place] - [address] Phone:  [phone]  Fax:  [phone]      Has the patient been seen for an appointment in the last year OR does the patient have an upcoming appointment? Yes.    Agent: Please be advised that RX refills may take up to 3 business days. We ask that you follow-up with your pharmacy.

## 2021-07-18 NOTE — Telephone Encounter (Signed)
Medication was already approved for refill yesterday  Requested Prescriptions  Pending Prescriptions Disp Refills   pantoprazole (PROTONIX) 20 MG tablet 90 tablet 1    Sig: Take 1 tablet (20 mg total) by mouth daily.     Gastroenterology: Proton Pump Inhibitors Passed - 07/18/2021  9:30 AM      Passed - Valid encounter within last 12 months    Recent Outpatient Visits          4 months ago Chronic bilateral low back pain without sciatica   [facility] [place], Salvadore Oxford, NP   10 months ago Annual physical exam   [facility], Jodelle Gross, FNP   1 year ago Acute non-recurrent sinusitis, unspecified location   [facility], Jodelle Gross, FNP   1 year ago Essential hypertension   [facility], Jodelle Gross, FNP   2 years ago Essential hypertension   [facility] [place], NP      Future Appointments            In 1 month [name], Salvadore Oxford, NP [facility], [facility]

## 2021-09-08 ENCOUNTER — Other Ambulatory Visit: Payer: Self-pay | Admitting: Internal Medicine

## 2021-09-08 DIAGNOSIS — F419 Anxiety disorder, unspecified: Secondary | ICD-10-CM

## 2021-09-09 NOTE — Telephone Encounter (Signed)
Requested medication (s) are due for refill today: yes  Requested medication (s) are on the active medication list: yes  Last refill:  03/04/21 #90 with 1 RF  Future visit scheduled: 09/23/21  Notes to clinic:  This medication can not be delegated, TSH and CBC out of protocol date, please assess.   Requested Prescriptions  Pending Prescriptions Disp Refills   ARIPiprazole (ABILIFY) 5 MG tablet [Pharmacy Med Name: ARIPIPRAZOLE 5 MG Tablet] 90 tablet 1    Sig: TAKE 1 TABLET EVERY DAY     Not Delegated - Psychiatry:  Antipsychotics - Second Generation (Atypical) - aripiprazole Failed - 09/08/2021 12:46 AM      Failed - This refill cannot be delegated      Failed - TSH in normal range and within 360 days    TSH  Date Value Ref Range Status  10/13/2019 3.22 0.40 - 4.50 mIU/L Final          Failed - Last BP in normal range    BP Readings from Last 1 Encounters:  03/04/21 (!) 160/68          Failed - Valid encounter within last 6 months    Recent Outpatient Visits           6 months ago Chronic bilateral low back pain without sciatica   [facility], [name], NP   1 year ago Annual physical exam   [facility], [name], FNP   1 year ago Acute non-recurrent sinusitis, unspecified location   [facility], [name], FNP   1 year ago Essential hypertension   [facility], [name], [place]   2 years ago Essential hypertension   [place], Jerrel Ivory, NP       Future Appointments             In 2 weeks Garnette Gunner, [name], NP [facility]            Failed - Lipid Panel in normal range within the last 12 months    Cholesterol, Total  Date Value Ref Range Status  08/08/2015 253 (H) 100 - 199 mg/dL Final   Cholesterol  Date Value Ref Range Status  03/04/2021 165 <200 mg/dL Final   LDL Cholesterol (Calc)  Date Value Ref Range Status  03/04/2021  89 mg/dL (calc) Final    Comment:    Reference range: <100 . Desirable range <100 mg/dL for primary prevention;   <70 mg/dL for patients with CHD or diabetic patients  with > or = 2 CHD risk factors. Marland Kitchen LDL-C is now calculated using the Martin-Hopkins  calculation, which is a validated novel method providing  better accuracy than the Friedewald equation in the  estimation of LDL-C.  Cresenciano Genre et al. Annamaria Helling. [id number]): 2061-2068  (http://education.[facility].com/faq/FAQ164)    HDL  Date Value Ref Range Status  03/04/2021 53 > OR = 50 mg/dL Final  08/08/2015 58 >39 mg/dL Final   Triglycerides  Date Value Ref Range Status  03/04/2021 125 <150 mg/dL Final         Failed - CBC within normal limits and completed in the last 12 months    WBC  Date Value Ref Range Status  09/04/2020 7.2 3.8 - 10.8 Thousand/uL Final   RBC  Date Value Ref Range Status  09/04/2020 4.11 3.80 - 5.10 Million/uL Final   Hemoglobin  Date Value Ref Range Status  09/04/2020 13.5 11.7 - 15.5 g/dL Final  04/09/2017 11.6 11.1 - 15.9 g/dL Final   HCT  Date Value Ref Range Status  09/04/2020 39.2 35.0 - 45.0 % Final   Hematocrit  Date Value Ref Range Status  04/09/2017 34.5 34.0 - 46.6 % Final   MCHC  Date Value Ref Range Status  09/04/2020 34.4 32.0 - 36.0 g/dL Final   [facility]  Date Value Ref Range Status  09/04/2020 32.8 27.0 - 33.0 pg Final   MCV  Date Value Ref Range Status  09/04/2020 95.4 80.0 - 100.0 fL Final  04/09/2017 92 79 - 97 fL Final   No results found for: PLTCOUNTKUC, LABPLAT, POCPLA RDW  Date Value Ref Range Status  09/04/2020 11.4 11.0 - 15.0 % Final  04/09/2017 13.7 12.3 - 15.4 % Final         Passed - Completed PHQ-2 or PHQ-9 in the last 360 days      Passed - Last Heart Rate in normal range    Pulse Readings from Last 1 Encounters:  03/04/21 (!) 54          Passed - CMP within normal limits and completed in the last 12 months    Albumin  Date Value Ref  Range Status  04/09/2017 3.4 (L) 3.6 - 4.8 g/dL Final   Alkaline Phosphatase  Date Value Ref Range Status  04/09/2017 83 39 - 117 IU/L Final   Alkaline phosphatase (APISO)  Date Value Ref Range Status  03/04/2021 74 37 - 153 U/L Final   ALT  Date Value Ref Range Status  03/04/2021 12 6 - 29 U/L Final   AST  Date Value Ref Range Status  03/04/2021 15 10 - 35 U/L Final   BUN  Date Value Ref Range Status  03/04/2021 10 7 - 25 mg/dL Final  04/09/2017 6 (L) 8 - 27 mg/dL Final   Calcium  Date Value Ref Range Status  03/04/2021 9.8 8.6 - 10.4 mg/dL Final   CO2  Date Value Ref Range Status  03/04/2021 24 20 - 32 mmol/L Final   Creat  Date Value Ref Range Status  03/04/2021 1.15 (H) 0.50 - 1.05 mg/dL Final   Glucose, Bld  Date Value Ref Range Status  03/04/2021 93 65 - 99 mg/dL Final    Comment:    .            Fasting reference interval .    Potassium  Date Value Ref Range Status  03/04/2021 4.4 3.5 - 5.3 mmol/L Final   Sodium  Date Value Ref Range Status  03/04/2021 140 135 - 146 mmol/L Final  04/09/2017 145 (H) 134 - 144 mmol/L Final   Total Bilirubin  Date Value Ref Range Status  03/04/2021 0.5 0.2 - 1.2 mg/dL Final   Bilirubin Total  Date Value Ref Range Status  04/09/2017 0.5 0.0 - 1.2 mg/dL Final   Total Protein  Date Value Ref Range Status  03/04/2021 6.7 6.1 - 8.1 g/dL Final  04/09/2017 5.9 (L) 6.0 - 8.5 g/dL Final   GFR, Est African American  Date Value Ref Range Status  09/04/2020 52 (L) > OR = 60 mL/min/1.43m2 Final   GFR, Est Non African American  Date Value Ref Range Status  09/04/2020 45 (L) > OR = 60 mL/min/1.50m2 Final

## 2021-09-10 ENCOUNTER — Encounter: Payer: Self-pay | Admitting: Internal Medicine

## 2021-09-10 ENCOUNTER — Encounter: Payer: Medicare HMO | Admitting: Internal Medicine

## 2021-09-11 ENCOUNTER — Encounter: Payer: Self-pay | Admitting: Internal Medicine

## 2021-09-11 DIAGNOSIS — F419 Anxiety disorder, unspecified: Secondary | ICD-10-CM

## 2021-09-11 MED ORDER — ARIPIPRAZOLE 5 MG PO TABS: 5.0000 mg | ORAL_TABLET | Freq: Every day | ORAL | 0 refills | Status: DC

## 2021-09-15 ENCOUNTER — Encounter: Payer: Self-pay | Admitting: Internal Medicine

## 2021-09-16 ENCOUNTER — Other Ambulatory Visit: Payer: Self-pay

## 2021-09-16 ENCOUNTER — Ambulatory Visit (INDEPENDENT_AMBULATORY_CARE_PROVIDER_SITE_OTHER): Payer: Medicare HMO

## 2021-09-16 DIAGNOSIS — Z0000 Encounter for general adult medical examination without abnormal findings: Secondary | ICD-10-CM | POA: Diagnosis not present

## 2021-09-16 NOTE — Progress Notes (Addendum)
Virtual Visit via Telephone Note  I connected with Elizabeth [name] on 09/16/21 at  2:20 PM EST by telephone and verified that I am speaking with the correct person using two identifiers.  Location: Patient: Elizabeth [name]  Provider: Webb Silversmith, NP   I discussed the limitations, risks, security and privacy concerns of performing an evaluation and management service by telephone and the availability of in person appointments. I also discussed with the patient that there may be a patient responsible charge related to this service. The patient expressed understanding and agreed to proceed.    I provided 30 minutes of non-face-to-face time during this encounter.   Wilson Singer, CMA   HPI:  Patient presents to clinic today for their subsequent annual Medicare wellness exam.  Past Medical History:  Diagnosis Date   Allergy 08/04/1998   Anxiety    Arthritis 08/04/2008   CKD (chronic kidney disease) stage 3, GFR 30-59 ml/min (HCC) 03/04/2021   Depression    GERD (gastroesophageal reflux disease)    Hypertension 11/02/1997   Insomnia    Mixed hyperlipidemia    Obesity     Current Outpatient Medications  Medication Sig Dispense Refill   acetaminophen (TYLENOL) 500 MG tablet Take 1,000 mg by mouth daily as needed.     ARIPiprazole (ABILIFY) 5 MG tablet Take 1 tablet (5 mg total) by mouth daily. 90 tablet 0   aspirin 81 MG tablet Take 81 mg by mouth daily.     atorvastatin (LIPITOR) 10 MG tablet TAKE 1 TABLET EVERY DAY 90 tablet 1   Blood Pressure Monitoring (SM WRIST CUFF BP MONITOR) MISC 1 Units by Does not apply route daily. 1 each 0   chlorpheniramine (CHLOR-TRIMETON) 4 MG tablet Take 4 mg by mouth 2 (two) times daily as needed for allergies.     citalopram (CELEXA) 40 MG tablet TAKE 1 TABLET EVERY DAY 90 tablet 1   cyclobenzaprine (FLEXERIL) 5 MG tablet Take 1 tablet (5 mg total) by mouth 3 (three) times daily as needed for muscle spasms. 90 tablet 1   fluticasone  (FLONASE) 50 MCG/ACT nasal spray USE 2 SPRAYS IN BOTH  NOSTRILS DAILY 48 g 1   losartan (COZAAR) 50 MG tablet TAKE 1 TABLET EVERY DAY 90 tablet 0   Multiple Vitamin (MULTIVITAMIN) tablet Take 1 tablet by mouth daily.     Omega-3 Fatty Acids (FISH OIL) 1000 MG CAPS Take by mouth.     pantoprazole (PROTONIX) 20 MG tablet TAKE 1 TABLET EVERY DAY 90 tablet 1   propranolol ER (INDERAL LA) 120 MG 24 hr capsule TAKE 1 CAPSULE EVERY DAY 60 capsule 0   rOPINIRole (REQUIP) 0.5 MG tablet Take by mouth.     gabapentin (NEURONTIN) 300 MG capsule Take 300 mg by mouth daily.     No current facility-administered medications for this visit.    Allergies  Allergen Reactions   Ace Inhibitors Cough    Family History  Problem Relation Age of Onset   Breast cancer Maternal Aunt 3   Alzheimer's disease Mother    Stroke Father    Hypertension Father    Multiple sclerosis Sister    Hypertension Sister    Early death Sister    Colon cancer Sister    Liver cancer Sister    Breast cancer Cousin        mat cousin   Parkinson's disease Paternal Uncle    Early death Sister    Hypertension Sister     Social History  Socioeconomic History   Marital status: Widowed    Spouse name: Not on file   Number of children: 2   Years of education: Not on file   Highest education level: High school graduate  Occupational History   Not on file  Tobacco Use   Smoking status: Every Day    Packs/day: 0.25    Years: 40.00    Pack years: 10.00    Types: Cigarettes   Smokeless tobacco: Never   Tobacco comments:    pt smoking 8 cigs daily  Vaping Use   Vaping Use: Never used  Substance and Sexual Activity   Alcohol use: Yes    Comment: about once a month    Drug use: No   Sexual activity: Not Currently    Birth control/protection: Post-menopausal  Other Topics Concern   Not on file  Social History Narrative   Not on file   Social Determinants of Health   Financial Resource Strain: Low Risk     Difficulty of Paying Living Expenses: Not hard at all  Food Insecurity: No Food Insecurity   Worried About Programme researcher, broadcasting/film/video in the Last Year: Never true   Ran Out of Food in the Last Year: Never true  Transportation Needs: Unknown   Lack of Transportation (Medical): No   Lack of Transportation (Non-Medical): Not on file  Physical Activity: Inactive   Days of Exercise per Week: 0 days   Minutes of Exercise per Session: 0 min  Stress: No Stress Concern Present   Feeling of Stress : Not at all  Social Connections: Socially Isolated   Frequency of Communication with Friends and Family: More than three times a week   Frequency of Social Gatherings with Friends and Family: Once a week   Attends Religious Services: Never   Database administrator or Organizations: No   Attends Banker Meetings: Never   Marital Status: Widowed  Intimate Partner Violence: Not on file    Hospitiliaztions: No recent Hospitalization   Health Maintenance: COVID Vaccine:  None  Shingles: None. Pt notified that she can get her Shingrix vaccine at her pharmacy at no cost th her.  Pneumonia Vaccine: 02/25/2013 Prevnar: None, the pt will get her Prevnar vaccine ather visit on 09/23/2021  Influenza: 08/27/2020. The pt will get her influenza vaccine 09/23/2021 TDAP: 02/25/2013, Next due on 02/26/2023  Colonoscopy:  03/04/2013 (requesting documentation) Pap Smear: 05/22/2021. Normal pap smear without signs of cervical cancer.  We recommend repeat screening in 3 years. Mammogram: 10/31/20- Normal, Repeat 1 yr  Dexa Scan: 10/31/2020      Providers:   PCP: Nicki Reaper, NP            OB-GYN: Ranae Plumber, MD [facility]             Neurologist: Harlin Rain, PA/ Cristopher Peru MD            Eye exam: [facility], Annual 09/23/2020            Dentist: No annual dental visit             I have personally reviewed and have noted:  1. The patient's medical and social history 2. Their use of  alcohol, tobacco or illicit drugs 3. Their current medications and supplements 4. The patient's functional ability including ADL's, fall risks, home safety risks and hearing or visual impairment. 5. Diet and physical activities 6. Evidence for depression or mood disorder  Subjective:  Review of Systems:  Objective:  PE:   There were no vitals taken for this visit. Wt Readings from Last 3 Encounters:  03/04/21 186 lb 6.4 oz (84.6 kg)  08/27/20 194 lb 8 oz (88.2 kg)  09/30/19 193 lb 1.6 oz (87.6 kg)     BMET    Component Value Date/Time   NA 140 03/04/2021 1339   NA 145 (H) 04/09/2017 1431   K 4.4 03/04/2021 1339   CL 107 03/04/2021 1339   CO2 24 03/04/2021 1339   GLUCOSE 93 03/04/2021 1339   BUN 10 03/04/2021 1339   BUN 6 (L) 04/09/2017 1431   CREATININE 1.15 (H) 03/04/2021 1339   CALCIUM 9.8 03/04/2021 1339   GFRNONAA 45 (L) 09/04/2020 0941   GFRAA 52 (L) 09/04/2020 0941    Lipid Panel     Component Value Date/Time   CHOL 165 03/04/2021 1339   CHOL 253 (H) 08/08/2015 1323   TRIG 125 03/04/2021 1339   HDL 53 03/04/2021 1339   HDL 58 08/08/2015 1323   CHOLHDL 3.1 03/04/2021 1339   VLDL 27 03/03/2017 1058   LDLCALC 89 03/04/2021 1339    CBC    Component Value Date/Time   WBC 7.2 09/04/2020 0941   RBC 4.11 09/04/2020 0941   HGB 13.5 09/04/2020 0941   HGB 11.6 04/09/2017 1431   HCT 39.2 09/04/2020 0941   HCT 34.5 04/09/2017 1431   PLT 355 09/04/2020 0941   PLT 376 04/09/2017 1431   MCV 95.4 09/04/2020 0941   MCV 92 04/09/2017 1431   MCH 32.8 09/04/2020 0941   MCHC 34.4 09/04/2020 0941   RDW 11.4 09/04/2020 0941   RDW 13.7 04/09/2017 1431   LYMPHSABS 1,699 09/04/2020 0941   LYMPHSABS 1.6 04/09/2017 1431   MONOABS 385 03/03/2017 1058   EOSABS 158 09/04/2020 0941   EOSABS 0.2 04/09/2017 1431   BASOSABS 79 09/04/2020 0941   BASOSABS 0.0 04/09/2017 1431    Hgb A1C Lab Results  Component Value Date   HGBA1C 5.2 03/04/2021      Assessment and  Plan:   Medicare Annual Wellness Visit:  Diet: Heart healthy  Physical activity: Sedentary Depression/mood screen: Negative,  [place] Office Visit from 09/16/2021 in [place]  PHQ-9 Total Score 0      Hearing: Intact to whispered voice Visual acuity: Grossly normal, performs annual eye exam  ADLs: Capable Fall risk: None Home safety: Good Cognitive evaluation:  6CIT Screen 09/16/2021 03/23/2018 02/17/2017  What Year? 0 points 0 points 0 points  What month? 0 points 0 points 0 points  What time? 0 points 0 points 0 points  Count back from 20 0 points 0 points 0 points  Months in reverse 0 points 0 points 0 points  Repeat phrase 0 points 2 points 0 points  Total Score 0 2 0     EOL planning: No Adv directives, full code    Next appointment: Physical scheduled on Monday, 09/23/2020. She will get her Prevnar and influenza vaccine on that appointment.   Wilson Singer, CMA

## 2021-09-23 ENCOUNTER — Encounter: Payer: Medicare HMO | Admitting: Internal Medicine

## 2021-09-26 NOTE — Addendum Note (Signed)
Addended by: Kenneisha Cochrane L on: 09/26/2021 04:06 PM ° ° Modules accepted: Level of Service ° °

## 2021-09-27 ENCOUNTER — Encounter: Payer: Self-pay | Admitting: Internal Medicine

## 2021-09-27 ENCOUNTER — Ambulatory Visit (INDEPENDENT_AMBULATORY_CARE_PROVIDER_SITE_OTHER): Payer: Medicare HMO | Admitting: Internal Medicine

## 2021-09-27 ENCOUNTER — Other Ambulatory Visit: Payer: Self-pay

## 2021-09-27 VITALS — BP 128/61 | HR 63 | Temp 97.5°F | Ht 59.0 in | Wt 191.0 lb

## 2021-09-27 DIAGNOSIS — G8929 Other chronic pain: Secondary | ICD-10-CM

## 2021-09-27 DIAGNOSIS — I10 Essential (primary) hypertension: Secondary | ICD-10-CM

## 2021-09-27 DIAGNOSIS — G252 Other specified forms of tremor: Secondary | ICD-10-CM

## 2021-09-27 DIAGNOSIS — K219 Gastro-esophageal reflux disease without esophagitis: Secondary | ICD-10-CM

## 2021-09-27 DIAGNOSIS — E782 Mixed hyperlipidemia: Secondary | ICD-10-CM

## 2021-09-27 DIAGNOSIS — Z1231 Encounter for screening mammogram for malignant neoplasm of breast: Secondary | ICD-10-CM

## 2021-09-27 DIAGNOSIS — Z6838 Body mass index (BMI) 38.0-38.9, adult: Secondary | ICD-10-CM

## 2021-09-27 DIAGNOSIS — Z23 Encounter for immunization: Secondary | ICD-10-CM

## 2021-09-27 DIAGNOSIS — E66813 Obesity, class 3: Secondary | ICD-10-CM | POA: Insufficient documentation

## 2021-09-27 DIAGNOSIS — F419 Anxiety disorder, unspecified: Secondary | ICD-10-CM | POA: Diagnosis not present

## 2021-09-27 DIAGNOSIS — Z0001 Encounter for general adult medical examination with abnormal findings: Secondary | ICD-10-CM | POA: Diagnosis not present

## 2021-09-27 DIAGNOSIS — E662 Morbid (severe) obesity with alveolar hypoventilation: Secondary | ICD-10-CM | POA: Insufficient documentation

## 2021-09-27 DIAGNOSIS — M545 Low back pain, unspecified: Secondary | ICD-10-CM | POA: Diagnosis not present

## 2021-09-27 DIAGNOSIS — E6609 Other obesity due to excess calories: Secondary | ICD-10-CM | POA: Insufficient documentation

## 2021-09-27 LAB — LIPID PANEL
Cholesterol: 141 mg/dL (ref <200)
HDL: 45 mg/dL — ABNORMAL LOW (ref >=50)
LDL Cholesterol (Calc): 71 mg/dL (calc)
Non-HDL Cholesterol (Calc): 96 mg/dL (calc) (ref <130)
Total CHOL/HDL Ratio: 3.1 (calc) (ref <5.0)
Triglycerides: 172 mg/dL — ABNORMAL HIGH (ref <150)

## 2021-09-27 LAB — CBC
HCT: 36.6 % (ref 35.0–45.0)
Hemoglobin: 12.5 g/dL (ref 11.7–15.5)
MCH: 32.5 pg (ref 27.0–33.0)
MCHC: 34.2 g/dL (ref 32.0–36.0)
MCV: 95.1 fL (ref 80.0–100.0)
MPV: 10.1 fL (ref 7.5–12.5)
Platelets: 338 10*3/uL (ref 140–400)
RBC: 3.85 10*6/uL (ref 3.80–5.10)
RDW: 11.8 % (ref 11.0–15.0)
WBC: 8.4 10*3/uL (ref 3.8–10.8)

## 2021-09-27 LAB — COMPLETE METABOLIC PANEL WITH GFR
AG Ratio: 1.6 (calc) (ref 1.0–2.5)
ALT: 10 U/L (ref 6–29)
AST: 14 U/L (ref 10–35)
Albumin: 4 g/dL (ref 3.6–5.1)
Alkaline phosphatase (APISO): 66 U/L (ref 37–153)
BUN/Creatinine Ratio: 15 (calc) (ref 6–22)
BUN: 18 mg/dL (ref 7–25)
CO2: 22 mmol/L (ref 20–32)
Calcium: 9.3 mg/dL (ref 8.6–10.4)
Chloride: 107 mmol/L (ref 98–110)
Creat: 1.23 mg/dL — ABNORMAL HIGH (ref 0.50–1.05)
Globulin: 2.5 g/dL (calc) (ref 1.9–3.7)
Glucose, Bld: 79 mg/dL (ref 65–139)
Potassium: 4.1 mmol/L (ref 3.5–5.3)
Sodium: 137 mmol/L (ref 135–146)
Total Bilirubin: 0.4 mg/dL (ref 0.2–1.2)
Total Protein: 6.5 g/dL (ref 6.1–8.1)
eGFR: 49 mL/min/{1.73_m2} — ABNORMAL LOW (ref >=60)

## 2021-09-27 MED ORDER — ATORVASTATIN CALCIUM 10 MG PO TABS: 10.0000 mg | ORAL_TABLET | Freq: Every day | ORAL | 1 refills | Status: DC

## 2021-09-27 MED ORDER — ARIPIPRAZOLE 5 MG PO TABS: 5.0000 mg | ORAL_TABLET | Freq: Every day | ORAL | 1 refills | Status: DC

## 2021-09-27 MED ORDER — CYCLOBENZAPRINE HCL 5 MG PO TABS: 5.0000 mg | ORAL_TABLET | Freq: Three times a day (TID) | ORAL | 1 refills | Status: DC | PRN

## 2021-09-27 MED ORDER — PANTOPRAZOLE SODIUM 20 MG PO TBEC: 20.0000 mg | DELAYED_RELEASE_TABLET | Freq: Every day | ORAL | 1 refills | Status: DC

## 2021-09-27 MED ORDER — ROPINIROLE HCL 0.5 MG PO TABS: 0.5000 mg | ORAL_TABLET | Freq: Every day | ORAL | 3 refills | Status: DC

## 2021-09-27 MED ORDER — LOSARTAN POTASSIUM 50 MG PO TABS: 50.0000 mg | ORAL_TABLET | Freq: Every day | ORAL | 1 refills | Status: DC

## 2021-09-27 MED ORDER — CITALOPRAM HYDROBROMIDE 40 MG PO TABS: 40.0000 mg | ORAL_TABLET | Freq: Every day | ORAL | 1 refills | Status: DC

## 2021-09-27 MED ORDER — PROPRANOLOL HCL ER 120 MG PO CP24: 120.0000 mg | ORAL_CAPSULE | Freq: Every day | ORAL | 1 refills | Status: DC

## 2021-09-27 MED ORDER — GABAPENTIN 300 MG PO CAPS: 300.0000 mg | ORAL_CAPSULE | Freq: Every day | ORAL | 0 refills | Status: DC

## 2021-09-27 NOTE — Assessment & Plan Note (Signed)
Encourage diet and exercise for weight loss 

## 2021-09-27 NOTE — Addendum Note (Signed)
Addended by: Kavin Leech E on: 09/27/2021 02:27 PM   Modules accepted: Orders

## 2021-09-27 NOTE — Progress Notes (Signed)
Subjective:    Patient ID: Elizabeth [name], female    DOB: 09/13/1955, 66 y.o.   MRN: [id number]  HPI  Patient presents to clinic today for annual exam.  Flu: 08/2020 Tetanus: 02/2013 COVID: never Pneumovax: 02/2013 Prevnar: never Shingrix: never Pap smear: 05/2021 Mammogram: 10/2020 Bone density: 10/2020 Colon screening: 2014, [facility] screening: annually Dentist: as needed  Diet: She does eat meat. She consumes fruits and veggies.  She does eat some fried foods. She drinks mostly coffee, water and soda. Exercise: None  Review of Systems     Past Medical History:  Diagnosis Date   Allergy 08/04/1998   Anxiety    Arthritis 08/04/2008   CKD (chronic kidney disease) stage 3, GFR 30-59 ml/min (HCC) 03/04/2021   Depression    GERD (gastroesophageal reflux disease)    Hypertension 11/02/1997   Insomnia    Mixed hyperlipidemia    Obesity     Current Outpatient Medications  Medication Sig Dispense Refill   acetaminophen (TYLENOL) 500 MG tablet Take 1,000 mg by mouth daily as needed.     ARIPiprazole (ABILIFY) 5 MG tablet Take 1 tablet (5 mg total) by mouth daily. 90 tablet 0   aspirin 81 MG tablet Take 81 mg by mouth daily.     atorvastatin (LIPITOR) 10 MG tablet TAKE 1 TABLET EVERY DAY 90 tablet 1   Blood Pressure Monitoring (SM WRIST CUFF BP MONITOR) MISC 1 Units by Does not apply route daily. 1 each 0   chlorpheniramine (CHLOR-TRIMETON) 4 MG tablet Take 4 mg by mouth 2 (two) times daily as needed for allergies.     citalopram (CELEXA) 40 MG tablet TAKE 1 TABLET EVERY DAY 90 tablet 1   cyclobenzaprine (FLEXERIL) 5 MG tablet Take 1 tablet (5 mg total) by mouth 3 (three) times daily as needed for muscle spasms. 90 tablet 1   fluticasone (FLONASE) 50 MCG/ACT nasal spray USE 2 SPRAYS IN BOTH  NOSTRILS DAILY 48 g 1   gabapentin (NEURONTIN) 300 MG capsule Take 300 mg by mouth daily.     losartan (COZAAR) 50 MG tablet TAKE 1 TABLET EVERY DAY 90 tablet 0   Multiple Vitamin  (MULTIVITAMIN) tablet Take 1 tablet by mouth daily.     Omega-3 Fatty Acids (FISH OIL) 1000 MG CAPS Take by mouth.     pantoprazole (PROTONIX) 20 MG tablet TAKE 1 TABLET EVERY DAY 90 tablet 1   propranolol ER (INDERAL LA) 120 MG 24 hr capsule TAKE 1 CAPSULE EVERY DAY 60 capsule 0   rOPINIRole (REQUIP) 0.5 MG tablet Take by mouth.     No current facility-administered medications for this visit.    Allergies  Allergen Reactions   Ace Inhibitors Cough    Family History  Problem Relation Age of Onset   Breast cancer Maternal Aunt 77   Alzheimer's disease Mother    Stroke Father    Hypertension Father    Multiple sclerosis Sister    Hypertension Sister    Early death Sister    Colon cancer Sister    Liver cancer Sister    Breast cancer Cousin        mat cousin   Parkinson's disease Paternal Uncle    Early death Sister    Hypertension Sister     Social History   Socioeconomic History   Marital status: Widowed    Spouse name: Not on file   Number of children: 2   Years of education: Not on file   Highest  education level: High school graduate  Occupational History   Not on file  Tobacco Use   Smoking status: Every Day    Packs/day: 0.25    Years: 40.00    Pack years: 10.00    Types: Cigarettes   Smokeless tobacco: Never   Tobacco comments:    pt smoking 8 cigs daily  Vaping Use   Vaping Use: Never used  Substance and Sexual Activity   Alcohol use: Yes    Comment: about once a month    Drug use: No   Sexual activity: Not Currently    Birth control/protection: Post-menopausal  Other Topics Concern   Not on file  Social History Narrative   Not on file   Social Determinants of Health   Financial Resource Strain: Low Risk    Difficulty of Paying Living Expenses: Not hard at all  Food Insecurity: No Food Insecurity   Worried About Charity fundraiser in the Last Year: Never true   Ran Out of Food in the Last Year: Never true  Transportation Needs: Unknown    Lack of Transportation (Medical): No   Lack of Transportation (Non-Medical): Not on file  Physical Activity: Inactive   Days of Exercise per Week: 0 days   Minutes of Exercise per Session: 0 min  Stress: No Stress Concern Present   Feeling of Stress : Not at all  Social Connections: Socially Isolated   Frequency of Communication with Friends and Family: More than three times a week   Frequency of Social Gatherings with Friends and Family: Once a week   Attends Religious Services: Never   Marine scientist or Organizations: No   Attends Archivist Meetings: Never   Marital Status: Widowed  Intimate Partner Violence: Not on file     Constitutional: Denies fever, malaise, fatigue, headache or abrupt weight changes.  HEENT: Denies eye pain, eye redness, ear pain, ringing in the ears, wax buildup, runny nose, nasal congestion, bloody nose, or sore throat. Respiratory: Denies difficulty breathing, shortness of breath, cough or sputum production.   Cardiovascular: Denies chest pain, chest tightness, palpitations or swelling in the hands or feet.  Gastrointestinal: Denies abdominal pain, bloating, constipation, diarrhea or blood in the stool.  GU: Denies urgency, frequency, pain with urination, burning sensation, blood in urine, odor or discharge. Musculoskeletal: Pt reports joint pain. Denies decrease in range of motion, difficulty with gait, muscle pain or joint swelling.  Skin: Denies redness, rashes, lesions or ulcercations.  Neurological: Patient reports tremor of right hand.  Denies dizziness, difficulty with memory, difficulty with speech or problems with balance and coordination.  Psych: Denies anxiety, depression, SI/HI.  No other specific complaints in a complete review of systems (except as listed in HPI above).  Objective:  BP 128/61 (BP Location: Right Arm, Patient Position: Sitting, Cuff Size: Large)    Pulse 63    Temp (!) 97.5 F (36.4 C) (Temporal)    Ht 4'  11" (1.499 m)    Wt 191 lb (86.6 kg)    SpO2 99%    BMI 38.58 kg/m   Wt Readings from Last 3 Encounters:  03/04/21 186 lb 6.4 oz (84.6 kg)  08/27/20 194 lb 8 oz (88.2 kg)  09/30/19 193 lb 1.6 oz (87.6 kg)    General: Appears her stated age, obese, in NAD. Skin: Warm, dry and intact.  Scabbed abrasion noted to right forearm. HEENT: Head: normal shape and size; Eyes: sclera white and EOMs intact; Neck:  Neck supple, trachea midline. No masses, lumps or thyromegaly present.  Cardiovascular: Normal rate and rhythm. S1,S2 noted.  No murmur, rubs or gallops noted. No JVD or BLE edema. No carotid bruits noted. Pulmonary/Chest: Normal effort and positive vesicular breath sounds. No respiratory distress. No wheezes, rales or ronchi noted.  Abdomen: Soft and nontender. Normal bowel sounds.  Musculoskeletal: Strength 5/5 BUE/BLE.  No difficulty with gait.  Neurological: Alert and oriented. Cranial nerves II-XII grossly intact.  Resting tremor noted of right hand. Psychiatric: Mood and affect normal. Behavior is normal. Judgment and thought content normal.    BMET    Component Value Date/Time   NA 140 03/04/2021 1339   NA 145 (H) 04/09/2017 1431   K 4.4 03/04/2021 1339   CL 107 03/04/2021 1339   CO2 24 03/04/2021 1339   GLUCOSE 93 03/04/2021 1339   BUN 10 03/04/2021 1339   BUN 6 (L) 04/09/2017 1431   CREATININE 1.15 (H) 03/04/2021 1339   CALCIUM 9.8 03/04/2021 1339   GFRNONAA 45 (L) 09/04/2020 0941   GFRAA 52 (L) 09/04/2020 0941    Lipid Panel     Component Value Date/Time   CHOL 165 03/04/2021 1339   CHOL 253 (H) 08/08/2015 1323   TRIG 125 03/04/2021 1339   HDL 53 03/04/2021 1339   HDL 58 08/08/2015 1323   CHOLHDL 3.1 03/04/2021 1339   VLDL 27 03/03/2017 1058   LDLCALC 89 03/04/2021 1339    CBC    Component Value Date/Time   WBC 7.2 09/04/2020 0941   RBC 4.11 09/04/2020 0941   HGB 13.5 09/04/2020 0941   HGB 11.6 04/09/2017 1431   HCT 39.2 09/04/2020 0941   HCT 34.5  04/09/2017 1431   PLT 355 09/04/2020 0941   PLT 376 04/09/2017 1431   MCV 95.4 09/04/2020 0941   MCV 92 04/09/2017 1431   MCH 32.8 09/04/2020 0941   MCHC 34.4 09/04/2020 0941   RDW 11.4 09/04/2020 0941   RDW 13.7 04/09/2017 1431   LYMPHSABS 1,699 09/04/2020 0941   LYMPHSABS 1.6 04/09/2017 1431   MONOABS 385 03/03/2017 1058   EOSABS 158 09/04/2020 0941   EOSABS 0.2 04/09/2017 1431   BASOSABS 79 09/04/2020 0941   BASOSABS 0.0 04/09/2017 1431    Hgb A1C Lab Results  Component Value Date   HGBA1C 5.2 03/04/2021            Assessment & Plan:   Preventative Health Maintenance:  Flu shot U today Tetanus UTD Prevnar 20 today She will not need another Pneumovax Encouraged her to get her COVID-vaccine Discussed Shingrix vaccine, she will check coverage with her insurance company Pap smear UTD Mammogram ordered-she will call to schedule Bone density UTD Colon screening due next year Encouraged her to consume a balanced diet and exercise regimen Advised her to see an eye doctor and dentist annually We will check CBC, c-Met, lipid profile today  RTC in 6 months, follow-up chronic conditions Webb Silversmith, NP This visit occurred during the SARS-CoV-2 public health emergency.  Safety protocols were in place, including screening questions prior to the visit, additional usage of staff PPE, and extensive cleaning of exam room while observing appropriate contact time as indicated for disinfecting solutions.

## 2021-09-27 NOTE — Patient Instructions (Signed)
Health Maintenance for Postmenopausal Women ?Menopause is a normal process in which your ability to get pregnant comes to an end. This process happens slowly over many months or years, usually between the ages of 48 and 55. Menopause is complete when you have missed your menstrual period for 12 months. ?It is important to talk with your health care provider about some of the most common conditions that affect women after menopause (postmenopausal women). These include heart disease, cancer, and bone loss (osteoporosis). Adopting a healthy lifestyle and getting preventive care can help to promote your health and wellness. The actions you take can also lower your chances of developing some of these common conditions. ?What are the signs and symptoms of menopause? ?During menopause, you may have the following symptoms: ?Hot flashes. These can be moderate or severe. ?Night sweats. ?Decrease in sex drive. ?Mood swings. ?Headaches. ?Tiredness (fatigue). ?Irritability. ?Memory problems. ?Problems falling asleep or staying asleep. ?Talk with your health care provider about treatment options for your symptoms. ?Do I need hormone replacement therapy? ?Hormone replacement therapy is effective in treating symptoms that are caused by menopause, such as hot flashes and night sweats. ?Hormone replacement carries certain risks, especially as you become older. If you are thinking about using estrogen or estrogen with progestin, discuss the benefits and risks with your health care provider. ?How can I reduce my risk for heart disease and stroke? ?The risk of heart disease, heart attack, and stroke increases as you age. One of the causes may be a change in the body's hormones during menopause. This can affect how your body uses dietary fats, triglycerides, and cholesterol. Heart attack and stroke are medical emergencies. There are many things that you can do to help prevent heart disease and stroke. ?Watch your blood pressure ?High  blood pressure causes heart disease and increases the risk of stroke. This is more likely to develop in people who have high blood pressure readings or are overweight. ?Have your blood pressure checked: ?Every 3-5 years if you are 18-39 years of age. ?Every year if you are 40 years old or older. ?Eat a healthy diet ? ?Eat a diet that includes plenty of vegetables, fruits, low-fat dairy products, and lean protein. ?Do not eat a lot of foods that are high in solid fats, added sugars, or sodium. ?Get regular exercise ?Get regular exercise. This is one of the most important things you can do for your health. Most adults should: ?Try to exercise for at least 150 minutes each week. The exercise should increase your heart rate and make you sweat (moderate-intensity exercise). ?Try to do strengthening exercises at least twice each week. Do these in addition to the moderate-intensity exercise. ?Spend less time sitting. Even light physical activity can be beneficial. ?Other tips ?Work with your health care provider to achieve or maintain a healthy weight. ?Do not use any products that contain nicotine or tobacco. These products include cigarettes, chewing tobacco, and vaping devices, such as e-cigarettes. If you need help quitting, ask your health care provider. ?Know your numbers. Ask your health care provider to check your cholesterol and your blood sugar (glucose). Continue to have your blood tested as directed by your health care provider. ?Do I need screening for cancer? ?Depending on your health history and family history, you may need to have cancer screenings at different stages of your life. This may include screening for: ?Breast cancer. ?Cervical cancer. ?Lung cancer. ?Colorectal cancer. ?What is my risk for osteoporosis? ?After menopause, you may be   at increased risk for osteoporosis. Osteoporosis is a condition in which bone destruction happens more quickly than new bone creation. To help prevent osteoporosis or  the bone fractures that can happen because of osteoporosis, you may take the following actions: ?If you are 19-50 years old, get at least 1,000 mg of calcium and at least 600 international units (IU) of vitamin D per day. ?If you are older than age 50 but younger than age 70, get at least 1,200 mg of calcium and at least 600 international units (IU) of vitamin D per day. ?If you are older than age 70, get at least 1,200 mg of calcium and at least 800 international units (IU) of vitamin D per day. ?Smoking and drinking excessive alcohol increase the risk of osteoporosis. Eat foods that are rich in calcium and vitamin D, and do weight-bearing exercises several times each week as directed by your health care provider. ?How does menopause affect my mental health? ?Depression may occur at any age, but it is more common as you become older. Common symptoms of depression include: ?Feeling depressed. ?Changes in sleep patterns. ?Changes in appetite or eating patterns. ?Feeling an overall lack of motivation or enjoyment of activities that you previously enjoyed. ?Frequent crying spells. ?Talk with your health care provider if you think that you are experiencing any of these symptoms. ?General instructions ?See your health care provider for regular wellness exams and vaccines. This may include: ?Scheduling regular health, dental, and eye exams. ?Getting and maintaining your vaccines. These include: ?Influenza vaccine. Get this vaccine each year before the flu season begins. ?Pneumonia vaccine. ?Shingles vaccine. ?Tetanus, diphtheria, and pertussis (Tdap) booster vaccine. ?Your health care provider may also recommend other immunizations. ?Tell your health care provider if you have ever been abused or do not feel safe at home. ?Summary ?Menopause is a normal process in which your ability to get pregnant comes to an end. ?This condition causes hot flashes, night sweats, decreased interest in sex, mood swings, headaches, or lack  of sleep. ?Treatment for this condition may include hormone replacement therapy. ?Take actions to keep yourself healthy, including exercising regularly, eating a healthy diet, watching your weight, and checking your blood pressure and blood sugar levels. ?Get screened for cancer and depression. Make sure that you are up to date with all your vaccines. ?This information is not intended to replace advice given to you by your health care provider. Make sure you discuss any questions you have with your health care provider. ?Document Revised: 12/10/2020 Document Reviewed: 12/10/2020 ?Elsevier Patient Education ? 2022 Elsevier Inc. ? ?

## 2021-10-18 DIAGNOSIS — A630 Anogenital (venereal) warts: Secondary | ICD-10-CM | POA: Diagnosis not present

## 2021-11-07 DIAGNOSIS — E785 Hyperlipidemia, unspecified: Secondary | ICD-10-CM | POA: Diagnosis not present

## 2021-11-07 DIAGNOSIS — N1831 Chronic kidney disease, stage 3a: Secondary | ICD-10-CM | POA: Diagnosis not present

## 2021-11-07 DIAGNOSIS — R0683 Snoring: Secondary | ICD-10-CM | POA: Diagnosis not present

## 2021-11-07 DIAGNOSIS — G2119 Other drug induced secondary parkinsonism: Secondary | ICD-10-CM | POA: Diagnosis not present

## 2021-11-07 DIAGNOSIS — M5416 Radiculopathy, lumbar region: Secondary | ICD-10-CM | POA: Diagnosis not present

## 2021-11-14 ENCOUNTER — Other Ambulatory Visit: Payer: Self-pay | Admitting: Internal Medicine

## 2021-11-14 DIAGNOSIS — G8929 Other chronic pain: Secondary | ICD-10-CM

## 2021-11-14 NOTE — Telephone Encounter (Signed)
Requested medication (s) are due for refill today- early request-mail order pharmacy ? ?Requested medication (s) are on the active medication list -yes ? ?Future visit scheduled -yes ? ?Last refill: 09/27/21 #90 1RF ? ?Notes to clinic: Request RF: non delegated Rx ? ?Requested Prescriptions  ?Pending Prescriptions Disp Refills  ? cyclobenzaprine (FLEXERIL) 5 MG tablet [Pharmacy Med Name: CYCLOBENZAPRINE HYDROCHLORIDE 5 MG Tablet] 90 tablet 1  ?  Sig: TAKE 1 TABLET THREE TIMES DAILY AS NEEDED FOR MUSCLE SPASM(S)  ?  ? Not Delegated - Analgesics:  Muscle Relaxants Failed - 11/14/2021  3:25 AM  ?  ?  Failed - This refill cannot be delegated  ?  ?  Passed - Valid encounter within last 6 months  ?  Recent Outpatient Visits   ? ?      ? 1 month ago Encounter for general adult medical examination with abnormal findings  ? [facility] [place], [place] W, NP  ? 8 months ago Chronic bilateral low back pain without sciatica  ? [facility] [place], Salvadore Oxford, NP  ? 1 year ago Annual physical exam  ? [facility], Jodelle Gross, FNP  ? 2 years ago Acute non-recurrent sinusitis, unspecified location  ? [facility], Jodelle Gross, FNP  ? 2 years ago Essential hypertension  ? [facility], Jodelle Gross, FNP  ? ?  ?  ?Future Appointments   ? ?        ? In 4 months [name], Salvadore Oxford, NP [facility]  ? ?  ? ?  ?  ?  ? ? ? ?Requested Prescriptions  ?Pending Prescriptions Disp Refills  ? cyclobenzaprine (FLEXERIL) 5 MG tablet [Pharmacy Med Name: CYCLOBENZAPRINE HYDROCHLORIDE 5 MG Tablet] 90 tablet 1  ?  Sig: TAKE 1 TABLET THREE TIMES DAILY AS NEEDED FOR MUSCLE SPASM(S)  ?  ? Not Delegated - Analgesics:  Muscle Relaxants Failed - 11/14/2021  3:25 AM  ?  ?  Failed - This refill cannot be delegated  ?  ?  Passed - Valid encounter within last 6 months  ?  Recent Outpatient Visits   ? ?      ? 1 month ago Encounter for general adult medical  examination with abnormal findings  ? [facility] [place], [place] W, NP  ? 8 months ago Chronic bilateral low back pain without sciatica  ? [facility] [place], Salvadore Oxford, NP  ? 1 year ago Annual physical exam  ? [facility], Jodelle Gross, FNP  ? 2 years ago Acute non-recurrent sinusitis, unspecified location  ? [facility], Jodelle Gross, FNP  ? 2 years ago Essential hypertension  ? [facility], Jodelle Gross, FNP  ? ?  ?  ?Future Appointments   ? ?        ? In 4 months [name], Salvadore Oxford, NP [facility]  ? ?  ? ?  ?  ?  ? ? ? ?

## 2021-11-17 ENCOUNTER — Other Ambulatory Visit: Payer: Self-pay | Admitting: Internal Medicine

## 2021-11-17 DIAGNOSIS — I10 Essential (primary) hypertension: Secondary | ICD-10-CM

## 2021-11-18 NOTE — Telephone Encounter (Signed)
Requested Prescriptions  ?Pending Prescriptions Disp Refills  ?? losartan (COZAAR) 50 MG tablet [Pharmacy Med Name: LOSARTAN POTASSIUM 50 MG Tablet] 90 tablet 1  ?  Sig: TAKE 1 TABLET EVERY DAY  ?  ? Cardiovascular:  Angiotensin Receptor Blockers Failed - 11/17/2021  3:17 AM  ?  ?  Failed - Cr in normal range and within 180 days  ?  Creat  ?Date Value Ref Range Status  ?09/27/2021 1.23 (H) 0.50 - 1.05 mg/dL Final  ?   ?  ?  Passed - K in normal range and within 180 days  ?  Potassium  ?Date Value Ref Range Status  ?09/27/2021 4.1 3.5 - 5.3 mmol/L Final  ?   ?  ?  Passed - Patient is not pregnant  ?  ?  Passed - Last BP in normal range  ?  BP Readings from Last 1 Encounters:  ?09/27/21 128/61  ?   ?  ?  Passed - Valid encounter within last 6 months  ?  Recent Outpatient Visits   ?      ? 1 month ago Encounter for general adult medical examination with abnormal findings  ? [facility] [place], [place] W, NP  ? 8 months ago Chronic bilateral low back pain without sciatica  ? [facility] [place], [name], NP  ? 1 year ago Annual physical exam  ? [facility], [name], FNP  ? 2 years ago Acute non-recurrent sinusitis, unspecified location  ? [place], FNP  ? 2 years ago Essential hypertension  ? [facility], [name], FNP  ?  ?  ?Future Appointments   ?        ? In 4 months [name], [name], NP [facility], [place]  ?  ? ?  ?  ?  ? ? ?

## 2021-11-26 DIAGNOSIS — G2119 Other drug induced secondary parkinsonism: Secondary | ICD-10-CM | POA: Diagnosis not present

## 2022-01-22 ENCOUNTER — Other Ambulatory Visit: Payer: Self-pay | Admitting: Internal Medicine

## 2022-01-22 DIAGNOSIS — M545 Other chronic pain: Secondary | ICD-10-CM

## 2022-01-22 NOTE — Telephone Encounter (Signed)
Requested medication (s) are due for refill today: yes  Requested medication (s) are on the active medication list: yes  Last refill:  11/14/21 #90 with 1 RF  Future visit scheduled: 03/27/22, seen 09/27/21  Notes to clinic:  This medication can not be delegated, please assess.        Requested Prescriptions  Pending Prescriptions Disp Refills   cyclobenzaprine (FLEXERIL) 5 MG tablet [Pharmacy Med Name: CYCLOBENZAPRINE HYDROCHLORIDE 5 MG Tablet] 90 tablet 1    Sig: TAKE 1 TABLET THREE TIMES DAILY AS NEEDED FOR MUSCLE SPASM(S)     Not Delegated - Analgesics:  Muscle Relaxants Failed - 01/22/2022  3:29 AM      Failed - This refill cannot be delegated      Passed - Valid encounter within last 6 months    Recent Outpatient Visits           3 months ago Encounter for general adult medical examination with abnormal findings   [facility] [place], Salvadore Oxford, NP   10 months ago Chronic bilateral low back pain without sciatica   [facility] [place], Salvadore Oxford, NP   1 year ago Annual physical exam   [facility], Jodelle Gross, FNP   2 years ago Acute non-recurrent sinusitis, unspecified location   [facility], Jodelle Gross, FNP   2 years ago Essential hypertension   [facility], Jodelle Gross, FNP       Future Appointments             In 2 months [name], Salvadore Oxford, NP [facility], [facility]

## 2022-02-06 ENCOUNTER — Inpatient Hospital Stay: Admission: RE | Admit: 2022-02-06 | Payer: Medicare HMO | Source: Ambulatory Visit

## 2022-02-24 ENCOUNTER — Other Ambulatory Visit: Payer: Self-pay | Admitting: Internal Medicine

## 2022-02-24 DIAGNOSIS — K219 Gastro-esophageal reflux disease without esophagitis: Secondary | ICD-10-CM

## 2022-02-25 NOTE — Telephone Encounter (Signed)
Requested Prescriptions  Pending Prescriptions Disp Refills  . pantoprazole (PROTONIX) 20 MG tablet [Pharmacy Med Name: PANTOPRAZOLE SODIUM 20 MG Tablet Delayed Release] 90 tablet 0    Sig: TAKE 1 TABLET EVERY DAY     Gastroenterology: Proton Pump Inhibitors Passed - 02/24/2022  8:48 AM      Passed - Valid encounter within last 12 months    Recent Outpatient Visits          5 months ago Encounter for general adult medical examination with abnormal findings   [facility] [place], Salvadore Oxford, NP   11 months ago Chronic bilateral low back pain without sciatica   [facility] [place], Salvadore Oxford, NP   1 year ago Annual physical exam   [facility], Jodelle Gross, FNP   2 years ago Acute non-recurrent sinusitis, unspecified location   [facility], Jodelle Gross, FNP   2 years ago Essential hypertension   [facility], Jodelle Gross, FNP      Future Appointments            In 1 month [name], Salvadore Oxford, NP [facility], [facility]

## 2022-02-26 ENCOUNTER — Ambulatory Visit
Admission: RE | Admit: 2022-02-26 | Discharge: 2022-02-26 | Disposition: A | Discharge status: Home / Self Care | Payer: Medicare HMO | Source: Ambulatory Visit | Attending: Internal Medicine | Admitting: Internal Medicine

## 2022-02-26 DIAGNOSIS — Z1231 Encounter for screening mammogram for malignant neoplasm of breast: Secondary | ICD-10-CM | POA: Insufficient documentation

## 2022-02-28 DIAGNOSIS — H524 Presbyopia: Secondary | ICD-10-CM | POA: Diagnosis not present

## 2022-03-13 ENCOUNTER — Telehealth: Payer: Medicare HMO | Admitting: Family Medicine

## 2022-03-13 DIAGNOSIS — J069 Acute upper respiratory infection, unspecified: Secondary | ICD-10-CM

## 2022-03-13 MED ORDER — LIDOCAINE VISCOUS HCL 2 % MT SOLN: 15.0000 mL | OROMUCOSAL | 0 refills | Status: DC | PRN

## 2022-03-13 MED ORDER — FLUTICASONE PROPIONATE 50 MCG/ACT NA SUSP: 2.0000 | Freq: Every day | NASAL | 0 refills | Status: DC

## 2022-03-13 MED ORDER — BENZONATATE 100 MG PO CAPS: 100.0000 mg | ORAL_CAPSULE | Freq: Two times a day (BID) | ORAL | 0 refills | Status: DC | PRN

## 2022-03-13 NOTE — Progress Notes (Signed)

## 2022-03-14 MED ORDER — LIDOCAINE VISCOUS HCL 2 % MT SOLN: OROMUCOSAL | 0 refills | Status: DC

## 2022-03-14 NOTE — Addendum Note (Signed)
Addended by: Margaretann Loveless on: 03/14/2022 11:10 AM   Modules accepted: Orders

## 2022-03-19 DIAGNOSIS — Z0100 Encounter for examination of eyes and vision without abnormal findings: Secondary | ICD-10-CM | POA: Diagnosis not present

## 2022-03-23 ENCOUNTER — Other Ambulatory Visit: Payer: Self-pay | Admitting: Internal Medicine

## 2022-03-25 NOTE — Telephone Encounter (Signed)
Requested Prescriptions  Pending Prescriptions Disp Refills  . atorvastatin (LIPITOR) 10 MG tablet [Pharmacy Med Name: ATORVASTATIN CALCIUM 10 MG Tablet] 90 tablet 1    Sig: TAKE 1 TABLET EVERY DAY     Cardiovascular:  Antilipid - Statins Failed - 03/23/2022  7:21 AM      Failed - Lipid Panel in normal range within the last 12 months    Cholesterol, Total  Date Value Ref Range Status  08/08/2015 253 (H) 100 - 199 mg/dL Final   Cholesterol  Date Value Ref Range Status  09/27/2021 141 <200 mg/dL Final   LDL Cholesterol (Calc)  Date Value Ref Range Status  09/27/2021 71 mg/dL (calc) Final    Comment:    Reference range: <100 . Desirable range <100 mg/dL for primary prevention;   <70 mg/dL for patients with CHD or diabetic patients  with > or = 2 CHD risk factors. Marland Kitchen LDL-C is now calculated using the Martin-Hopkins  calculation, which is a validated novel method providing  better accuracy than the Friedewald equation in the  estimation of LDL-C.  Horald Pollen et al. Lenox Ahr. 8469;629(52): 2061-2068  (http://education.Quest[employer].com/faq/FAQ164)    HDL  Date Value Ref Range Status  09/27/2021 45 (L) > OR = 50 mg/dL Final  84/13/2440 58 >10 mg/dL Final   Triglycerides  Date Value Ref Range Status  09/27/2021 172 (H) <150 mg/dL Final         Passed - Patient is not pregnant      Passed - Valid encounter within last 12 months    Recent Outpatient Visits          5 months ago Encounter for general adult medical examination with abnormal findings   [facility] [place], Salvadore Oxford, NP   1 year ago Chronic bilateral low back pain without sciatica   [facility] [place], Salvadore Oxford, NP   1 year ago Annual physical exam   [facility], Jodelle Gross, FNP   2 years ago Acute non-recurrent sinusitis, unspecified location   [facility], Jodelle Gross, FNP   2 years ago Essential hypertension   [facility], Jodelle Gross, FNP      Future Appointments            In 2 days Sampson Si, Salvadore Oxford, NP [facility], [facility]

## 2022-03-27 ENCOUNTER — Ambulatory Visit (INDEPENDENT_AMBULATORY_CARE_PROVIDER_SITE_OTHER): Payer: Medicare HMO | Admitting: Internal Medicine

## 2022-03-27 ENCOUNTER — Encounter: Payer: Self-pay | Admitting: Internal Medicine

## 2022-03-27 VITALS — BP 143/74 | HR 64 | Ht 59.0 in | Wt 189.0 lb

## 2022-03-27 DIAGNOSIS — N1831 Chronic kidney disease, stage 3a: Secondary | ICD-10-CM | POA: Diagnosis not present

## 2022-03-27 DIAGNOSIS — I10 Essential (primary) hypertension: Secondary | ICD-10-CM

## 2022-03-27 DIAGNOSIS — G2119 Other drug induced secondary parkinsonism: Secondary | ICD-10-CM

## 2022-03-27 DIAGNOSIS — M85851 Other specified disorders of bone density and structure, right thigh: Secondary | ICD-10-CM

## 2022-03-27 DIAGNOSIS — K219 Gastro-esophageal reflux disease without esophagitis: Secondary | ICD-10-CM

## 2022-03-27 DIAGNOSIS — M545 Low back pain, unspecified: Secondary | ICD-10-CM | POA: Diagnosis not present

## 2022-03-27 DIAGNOSIS — M1990 Unspecified osteoarthritis, unspecified site: Secondary | ICD-10-CM

## 2022-03-27 DIAGNOSIS — Z6838 Body mass index (BMI) 38.0-38.9, adult: Secondary | ICD-10-CM

## 2022-03-27 DIAGNOSIS — E782 Mixed hyperlipidemia: Secondary | ICD-10-CM | POA: Diagnosis not present

## 2022-03-27 DIAGNOSIS — G8929 Other chronic pain: Secondary | ICD-10-CM

## 2022-03-27 DIAGNOSIS — F419 Anxiety disorder, unspecified: Secondary | ICD-10-CM | POA: Diagnosis not present

## 2022-03-27 DIAGNOSIS — F39 Unspecified mood [affective] disorder: Secondary | ICD-10-CM

## 2022-03-27 MED ORDER — ARIPIPRAZOLE 5 MG PO TABS: 5.0000 mg | ORAL_TABLET | Freq: Every day | ORAL | 1 refills | Status: DC

## 2022-03-27 NOTE — Assessment & Plan Note (Signed)
Encourage regular stretching, exercise for weight loss and core strengthening Continue turmeric, Voltaren gel, Tylenol and cyclobenzaprine 

## 2022-03-27 NOTE — Assessment & Plan Note (Signed)
C-Met and lipid profile today Encouraged her to consume a low-fat diet Continue atorvastatin 

## 2022-03-27 NOTE — Patient Instructions (Signed)
Chronic Kidney Disease, Adult Chronic kidney disease is when lasting damage happens to the kidneys slowly over a long time. The kidneys help to: Make pee (urine). Make hormones. Keep the right amount of fluids and chemicals in the body. Most often, this disease does not go away. You must take steps to help keep the kidney damage from getting worse. If steps are not taken, the kidneys might stop working forever. What are the causes? Diabetes. High blood pressure. Diseases that affect the heart and blood vessels. Other kidney diseases. Diseases of the body's disease-fighting system. A problem with the flow of pee. Infections of the organs that make pee, store it, and take it out of the body. Swelling or irritation of your blood vessels. What increases the risk? Getting older. Having someone in your family who has kidney disease or kidney failure. Having a disease caused by genes. Taking medicines often that harm the kidneys. Being near or having contact with harmful substances. Being very overweight. Using tobacco now or in the past. What are the signs or symptoms? Feeling very tired. Having a swollen face, legs, ankles, or feet. Feeling like you may vomit or vomiting. Not feeling hungry. Being confused or not able to focus. Twitches and cramps in the leg muscles or other muscles. Dry, itchy skin. A taste of metal in your mouth. Making less pee, or making more pee. Shortness of breath. Trouble sleeping. You may also become anemic or get weak bones. Anemic means there is not enough red blood cells or hemoglobin in your blood. You may get symptoms slowly. You may not notice them until the kidney damage gets very bad. How is this treated? Often, there is no cure for this disease. Treatment can help with symptoms and help keep the disease from getting worse. You may need to: Avoid alcohol. Avoid foods that are high in salt, potassium, phosphorous, and protein. Take medicines for  symptoms and to help control other conditions. Have dialysis. This treatment gets harmful waste out of your body. Treat other problems that cause your kidney disease or make it worse. Follow these instructions at home: Medicines Take over-the-counter and prescription medicines only as told by your doctor. Do not take any new medicines, vitamins, or supplements unless your doctor says it is okay. Lifestyle  Do not smoke or use any products that contain nicotine or tobacco. If you need help quitting, ask your doctor. If you drink alcohol: Limit how much you use to: 0-1 drink a day for women who are not pregnant. 0-2 drinks a day for men. Know how much alcohol is in your drink. In the [place] one drink equals one 12 oz bottle of beer (355 mL), one 5 oz glass of wine (148 mL), or one 1 oz glass of hard liquor (44 mL). Stay at a healthy weight. If you need help losing weight, ask your doctor. General instructions  Follow instructions from your doctor about what you cannot eat or drink. Track your blood pressure at home. Tell your doctor about any changes. If you have diabetes, track your blood sugar. Exercise at least 30 minutes a day, 5 days a week. Keep your shots (vaccinations) up to date. Keep all follow-up visits. Where to find more information American Association of Kidney Patients: www.aakp.org National Kidney Foundation: www.kidney.org American Kidney Fund: www.akfinc.org Life Options: www.lifeoptions.org Kidney School: www.kidneyschool.org Contact a doctor if: Your symptoms get worse. You get new symptoms. Get help right away if: You get symptoms of end-stage kidney disease. These   include: Headaches. Losing feeling in your hands or feet. Easy bruising. Having hiccups often. Chest pain. Shortness of breath. Lack of menstrual periods, in women. You have a fever. You make less pee than normal. You have pain or you bleed when you pee or poop. These symptoms may be an  emergency. Get help right away. Call your local emergency services (911 in the [place] Do not wait to see if the symptoms will go away. Do not drive yourself to the hospital. Summary Chronic kidney disease is when lasting damage happens to the kidneys slowly over a long time. Causes of this disease include diabetes and high blood pressure. Often, there is no cure for this disease. Treatment can help symptoms and help keep the disease from getting worse. Treatment may involve lifestyle changes, medicines, and dialysis. This information is not intended to replace advice given to you by your health care provider. Make sure you discuss any questions you have with your health care provider. Document Revised: 10/26/2019 Document Reviewed: 10/26/2019 Elsevier Patient Education  2023 Elsevier Inc.  

## 2022-03-27 NOTE — Assessment & Plan Note (Signed)
Continue calcium and vitamin D Encourage daily weightbearing exercise 

## 2022-03-27 NOTE — Assessment & Plan Note (Signed)
Manual repeat improved but still elevated Continue losartan and propanolol Reinforced DASH diet and exercise for weight loss C-Met today

## 2022-03-27 NOTE — Assessment & Plan Note (Signed)
Managed with Sinemet and propranolol She will continue to follow with neurology

## 2022-03-27 NOTE — Assessment & Plan Note (Signed)
Encourage regular stretching, exercise for weight loss and core strengthening Continue turmeric, Voltaren gel, Tylenol and cyclobenzaprine

## 2022-03-27 NOTE — Assessment & Plan Note (Signed)
Encourage diet and exercise for weight loss 

## 2022-03-27 NOTE — Assessment & Plan Note (Signed)
Stable on citalopram and Abilify Support offered

## 2022-03-27 NOTE — Assessment & Plan Note (Signed)
C-Met today On losartan for renal protection 

## 2022-03-27 NOTE — Progress Notes (Signed)
Subjective:    Patient ID: Elizabeth [name], female    DOB: May 27, 1956, 66 y.o.   MRN: [id number]  HPI  Patient presents to clinic today for 42-month follow-up of chronic conditions.  HTN: Her BP today is 151/81.  She is taking Losartan and Propranolol as prescribed.  There is no ECG on file.  HLD: Her last LDL was 71, triglycerides 172, 09/2021.  She denies myalgias on Atorvastatin.  She does not  consume a low-fat diet.  Anxiety and Depression: Chronic, managed on Citalopram and Abilify.  She is not currently seeing a therapist.  She denies SI/HI.  OA/Chronic Back Pain: Managed on Turmeric, Voltaren gel, Tylenol, and Cyclobenzaprine.  She does not follow with orthopedics.  GERD: Triggered by spicy foods.  She denies breakthrough on Pantoprazole.  There is no upper GI on file.  Osteopenia: She is taking Calcium and Vitamin D OTC.  She tries to get 30 minutes of weightbearing exercise daily.  Bone density from 10/2020 reviewed.  Drug-Induced Parkinsonism: Mainly a tremor. Triggered by Abilify.  Managed with Sinement and Propanolol.  She follows with neurology.  CKD 3: Her last creatinine was 1.23, GFR 49, 09/2021.  She is taking Losartan for renal protection.  She does not follow with nephrology.   Review of Systems  Past Medical History:  Diagnosis Date   Allergy 08/04/1998   Anxiety    Arthritis 08/04/2008   CKD (chronic kidney disease) stage 3, GFR 30-59 ml/min (HCC) 03/04/2021   Depression    GERD (gastroesophageal reflux disease)    Hypertension 11/02/1997   Insomnia    Mixed hyperlipidemia    Obesity     Current Outpatient Medications  Medication Sig Dispense Refill   acetaminophen (TYLENOL) 500 MG tablet Take 1,000 mg by mouth daily as needed.     ARIPiprazole (ABILIFY) 5 MG tablet Take 1 tablet (5 mg total) by mouth daily. 90 tablet 1   aspirin 81 MG tablet Take 81 mg by mouth daily.     atorvastatin (LIPITOR) 10 MG tablet TAKE 1 TABLET EVERY DAY 90 tablet 0    benzonatate (TESSALON) 100 MG capsule Take 1 capsule (100 mg total) by mouth 2 (two) times daily as needed for cough. 20 capsule 0   Blood Pressure Monitoring (SM WRIST CUFF BP MONITOR) MISC 1 Units by Does not apply route daily. 1 each 0   chlorpheniramine (CHLOR-TRIMETON) 4 MG tablet Take 4 mg by mouth 2 (two) times daily as needed for allergies.     citalopram (CELEXA) 40 MG tablet Take 1 tablet (40 mg total) by mouth daily. 90 tablet 1   cyclobenzaprine (FLEXERIL) 5 MG tablet TAKE 1 TABLET THREE TIMES DAILY AS NEEDED FOR MUSCLE SPASM(S) 90 tablet 1   fluticasone (FLONASE) 50 MCG/ACT nasal spray Place 2 sprays into both nostrils daily. 16 g 0   gabapentin (NEURONTIN) 300 MG capsule Take 1 capsule (300 mg total) by mouth daily. 90 capsule 0   lidocaine (XYLOCAINE) 2 % solution Swallow 35mL every 4-6 hours as needed for throat pain 100 mL 0   losartan (COZAAR) 50 MG tablet TAKE 1 TABLET EVERY DAY 90 tablet 1   Multiple Vitamin (MULTIVITAMIN) tablet Take 1 tablet by mouth daily.     Omega-3 Fatty Acids (FISH OIL) 1000 MG CAPS Take by mouth.     pantoprazole (PROTONIX) 20 MG tablet TAKE 1 TABLET EVERY DAY 90 tablet 0   propranolol ER (INDERAL LA) 120 MG 24 hr capsule Take 1 capsule (  120 mg total) by mouth daily. 90 capsule 1   rOPINIRole (REQUIP) 0.5 MG tablet Take 1 tablet (0.5 mg total) by mouth at bedtime. 90 tablet 3   No current facility-administered medications for this visit.    Allergies  Allergen Reactions   Ace Inhibitors Cough    Family History  Problem Relation Age of Onset   Breast cancer Maternal Aunt 20   Alzheimer's disease Mother    Stroke Father    Hypertension Father    Multiple sclerosis Sister    Hypertension Sister    Early death Sister    Colon cancer Sister    Liver cancer Sister    Breast cancer Cousin        mat cousin   Parkinson's disease Paternal Uncle    Early death Sister    Hypertension Sister     Social History   Socioeconomic History    Marital status: Widowed    Spouse name: Not on file   Number of children: 2   Years of education: Not on file   Highest education level: High school graduate  Occupational History   Not on file  Tobacco Use   Smoking status: Every Day    Packs/day: 0.25    Years: 40.00    Total pack years: 10.00    Types: Cigarettes   Smokeless tobacco: Never   Tobacco comments:    pt smoking 8 cigs daily  Vaping Use   Vaping Use: Never used  Substance and Sexual Activity   Alcohol use: Yes    Comment: about once a month    Drug use: No   Sexual activity: Not Currently    Birth control/protection: Post-menopausal  Other Topics Concern   Not on file  Social History Narrative   Not on file   Social Determinants of Health   Financial Resource Strain: Low Risk  (09/16/2021)   Overall Financial Resource Strain (CARDIA)    Difficulty of Paying Living Expenses: Not hard at all  Food Insecurity: No Food Insecurity (09/16/2021)   Hunger Vital Sign    Worried About Running Out of Food in the Last Year: Never true    Ran Out of Food in the Last Year: Never true  Transportation Needs: Unknown (09/16/2021)   PRAPARE - Hydrologist (Medical): No    Lack of Transportation (Non-Medical): Not on file  Physical Activity: Inactive (09/16/2021)   Exercise Vital Sign    Days of Exercise per Week: 0 days    Minutes of Exercise per Session: 0 min  Stress: No Stress Concern Present (09/16/2021)   [place]    Feeling of Stress : Not at all  Social Connections: Socially Isolated (09/16/2021)   Social Connection and Isolation Panel [NHANES]    Frequency of Communication with Friends and Family: More than three times a week    Frequency of Social Gatherings with Friends and Family: Once a week    Attends Religious Services: Never    Marine scientist or Organizations: No    Attends Archivist Meetings:  Never    Marital Status: Widowed  Intimate Partner Violence: Not At Risk (03/23/2018)   Humiliation, Afraid, Rape, and Kick questionnaire    Fear of Current or Ex-Partner: No    Emotionally Abused: No    Physically Abused: No    Sexually Abused: No     Constitutional: Denies fever, malaise, fatigue, headache or  abrupt weight changes.  HEENT: Denies eye pain, eye redness, ear pain, ringing in the ears, wax buildup, runny nose, nasal congestion, bloody nose, or sore throat. Respiratory: Denies difficulty breathing, shortness of breath, cough or sputum production.   Cardiovascular: Denies chest pain, chest tightness, palpitations or swelling in the hands or feet.  Gastrointestinal: Denies abdominal pain, bloating, constipation, diarrhea or blood in the stool.  GU: Denies urgency, frequency, pain with urination, burning sensation, blood in urine, odor or discharge. Musculoskeletal: Patient reports chronic back and joint pain.  Denies decrease in range of motion, difficulty with gait, or joint swelling.  Skin: Denies redness, rashes, lesions or ulcercations.  Neurological: Patient reports tremor. Denies dizziness, difficulty with memory, difficulty with speech or problems with balance and coordination.  Psych: Patient has a history of anxiety and depression.  Denies SI/HI.  No other specific complaints in a complete review of systems (except as listed in HPI above).     Objective:   Physical Exam  BP (!) 143/74   Pulse 64   Ht 4\' 11"  (1.499 m)   Wt 189 lb (85.7 kg)   SpO2 96%   BMI 38.17 kg/m   Wt Readings from Last 3 Encounters:  09/27/21 191 lb (86.6 kg)  03/04/21 186 lb 6.4 oz (84.6 kg)  08/27/20 194 lb 8 oz (88.2 kg)    General: Appears her stated age, obese, in NAD. Skin: Warm, dry and intact.  HEENT: Head: normal shape and size; Eyes: sclera white, no icterus, conjunctiva pink, PERRLA and EOMs intact;  Cardiovascular: Normal rate and rhythm. S1,S2 noted.  No murmur, rubs  or gallops noted. No JVD or BLE edema. No carotid bruits noted. Pulmonary/Chest: Normal effort and positive vesicular breath sounds. No respiratory distress. No wheezes, rales or ronchi noted.  Abdomen: Soft and nontender. Normal bowel sounds.  Musculoskeletal: Strength 5/5 BUE/BLE.  No difficulty with gait.  Neurological: Alert and oriented.  Tremor noted of right upper extremity. Psychiatric: Mood and affect normal. Behavior is normal. Judgment and thought content normal.    BMET    Component Value Date/Time   NA 137 09/27/2021 1420   NA 145 (H) 04/09/2017 1431   K 4.1 09/27/2021 1420   CL 107 09/27/2021 1420   CO2 22 09/27/2021 1420   GLUCOSE 79 09/27/2021 1420   BUN 18 09/27/2021 1420   BUN 6 (L) 04/09/2017 1431   CREATININE 1.23 (H) 09/27/2021 1420   CALCIUM 9.3 09/27/2021 1420   GFRNONAA 45 (L) 09/04/2020 0941   GFRAA 52 (L) 09/04/2020 0941    Lipid Panel     Component Value Date/Time   CHOL 141 09/27/2021 1420   CHOL 253 (H) 08/08/2015 1323   TRIG 172 (H) 09/27/2021 1420   HDL 45 (L) 09/27/2021 1420   HDL 58 08/08/2015 1323   CHOLHDL 3.1 09/27/2021 1420   VLDL 27 03/03/2017 1058   LDLCALC 71 09/27/2021 1420    CBC    Component Value Date/Time   WBC 8.4 09/27/2021 1420   RBC 3.85 09/27/2021 1420   HGB 12.5 09/27/2021 1420   HGB 11.6 04/09/2017 1431   HCT 36.6 09/27/2021 1420   HCT 34.5 04/09/2017 1431   PLT 338 09/27/2021 1420   PLT 376 04/09/2017 1431   MCV 95.1 09/27/2021 1420   MCV 92 04/09/2017 1431   MCH 32.5 09/27/2021 1420   MCHC 34.2 09/27/2021 1420   RDW 11.8 09/27/2021 1420   RDW 13.7 04/09/2017 1431   LYMPHSABS 1,699 09/04/2020 0941  LYMPHSABS 1.6 04/09/2017 1431   MONOABS 385 03/03/2017 1058   EOSABS 158 09/04/2020 0941   EOSABS 0.2 04/09/2017 1431   BASOSABS 79 09/04/2020 0941   BASOSABS 0.0 04/09/2017 1431    Hgb A1C Lab Results  Component Value Date   HGBA1C 5.2 03/04/2021            Assessment & Plan:     RTC in 6  months for annual exam Nicki Reaper, NP

## 2022-03-27 NOTE — Assessment & Plan Note (Signed)
Try to avoid foods that trigger reflux Encouraged weight loss as this can help reduce reflux symptoms Continue pantoprazole

## 2022-03-28 LAB — COMPLETE METABOLIC PANEL WITH GFR
AG Ratio: 1.5 (calc) (ref 1.0–2.5)
ALT: 6 U/L (ref 6–29)
AST: 12 U/L (ref 10–35)
Albumin: 4 g/dL (ref 3.6–5.1)
Alkaline phosphatase (APISO): 77 U/L (ref 37–153)
BUN/Creatinine Ratio: 15 (calc) (ref 6–22)
BUN: 17 mg/dL (ref 7–25)
CO2: 23 mmol/L (ref 20–32)
Calcium: 10 mg/dL (ref 8.6–10.4)
Chloride: 106 mmol/L (ref 98–110)
Creat: 1.12 mg/dL — ABNORMAL HIGH (ref 0.50–1.05)
Globulin: 2.7 g/dL (calc) (ref 1.9–3.7)
Glucose, Bld: 94 mg/dL (ref 65–99)
Potassium: 4.1 mmol/L (ref 3.5–5.3)
Sodium: 141 mmol/L (ref 135–146)
Total Bilirubin: 0.5 mg/dL (ref 0.2–1.2)
Total Protein: 6.7 g/dL (ref 6.1–8.1)
eGFR: 54 mL/min/{1.73_m2} — ABNORMAL LOW (ref >=60)

## 2022-03-28 LAB — LIPID PANEL
Cholesterol: 168 mg/dL (ref <200)
HDL: 60 mg/dL (ref >=50)
LDL Cholesterol (Calc): 90 mg/dL (calc)
Non-HDL Cholesterol (Calc): 108 mg/dL (calc) (ref <130)
Total CHOL/HDL Ratio: 2.8 (calc) (ref <5.0)
Triglycerides: 86 mg/dL (ref <150)

## 2022-04-01 ENCOUNTER — Other Ambulatory Visit: Payer: Self-pay | Admitting: Internal Medicine

## 2022-04-01 DIAGNOSIS — M545 Other chronic pain: Secondary | ICD-10-CM

## 2022-04-01 NOTE — Telephone Encounter (Signed)
Requested medication (s) are due for refill today - yes  Requested medication (s) are on the active medication list -yes  Future visit scheduled -yes  Last refill: 01/23/22 #90 1RF  Notes to clinic: non delegated Rx  Requested Prescriptions  Pending Prescriptions Disp Refills   cyclobenzaprine (FLEXERIL) 5 MG tablet [Pharmacy Med Name: CYCLOBENZAPRINE HYDROCHLORIDE 5 MG Tablet] 90 tablet 1    Sig: TAKE 1 TABLET THREE TIMES DAILY AS NEEDED FOR MUSCLE SPASM(S)     Not Delegated - Analgesics:  Muscle Relaxants Failed - 04/01/2022  2:29 AM      Failed - This refill cannot be delegated      Passed - Valid encounter within last 6 months    Recent Outpatient Visits           5 days ago Mixed hyperlipidemia   [facility], Salvadore Oxford, NP   6 months ago Encounter for general adult medical examination with abnormal findings   [facility] [place], Salvadore Oxford, NP   1 year ago Chronic bilateral low back pain without sciatica   [facility] [place], Salvadore Oxford, NP   1 year ago Annual physical exam   [facility], Jodelle Gross, FNP   2 years ago Acute non-recurrent sinusitis, unspecified location   [facility], Jodelle Gross, FNP       Future Appointments             In 6 months [name], Salvadore Oxford, NP [facility], [facility]               Requested Prescriptions  Pending Prescriptions Disp Refills   cyclobenzaprine (FLEXERIL) 5 MG tablet [Pharmacy Med Name: CYCLOBENZAPRINE HYDROCHLORIDE 5 MG Tablet] 90 tablet 1    Sig: TAKE 1 TABLET THREE TIMES DAILY AS NEEDED FOR MUSCLE SPASM(S)     Not Delegated - Analgesics:  Muscle Relaxants Failed - 04/01/2022  2:29 AM      Failed - This refill cannot be delegated      Passed - Valid encounter within last 6 months    Recent Outpatient Visits           5 days ago Mixed hyperlipidemia   [facility] [place], Salvadore Oxford, NP   6 months ago  Encounter for general adult medical examination with abnormal findings   [facility] [place], Salvadore Oxford, NP   1 year ago Chronic bilateral low back pain without sciatica   [facility] [place], Salvadore Oxford, NP   1 year ago Annual physical exam   [facility], Jodelle Gross, FNP   2 years ago Acute non-recurrent sinusitis, unspecified location   [facility], Jodelle Gross, FNP       Future Appointments             In 6 months [name], Salvadore Oxford, NP [facility]

## 2022-04-13 ENCOUNTER — Other Ambulatory Visit: Payer: Self-pay | Admitting: Internal Medicine

## 2022-04-13 DIAGNOSIS — F419 Anxiety disorder, unspecified: Secondary | ICD-10-CM

## 2022-04-15 NOTE — Telephone Encounter (Signed)
Requested Prescriptions  Pending Prescriptions Disp Refills  . citalopram (CELEXA) 40 MG tablet [Pharmacy Med Name: CITALOPRAM HYDROBROMIDE 40 MG Tablet] 90 tablet 1    Sig: TAKE 1 TABLET EVERY DAY     Psychiatry:  Antidepressants - SSRI Passed - 04/13/2022  4:26 AM      Passed - Valid encounter within last 6 months    Recent Outpatient Visits          2 weeks ago Mixed hyperlipidemia   [facility] [place], Salvadore Oxford, NP   6 months ago Encounter for general adult medical examination with abnormal findings   [facility] [place], Salvadore Oxford, NP   1 year ago Chronic bilateral low back pain without sciatica   [facility] [place], Salvadore Oxford, NP   1 year ago Annual physical exam   [facility], Jodelle Gross, FNP   2 years ago Acute non-recurrent sinusitis, unspecified location   [facility], Jodelle Gross, FNP      Future Appointments            In 5 months [name], Salvadore Oxford, NP [facility], [facility]

## 2022-05-08 ENCOUNTER — Telehealth: Payer: Medicare HMO | Admitting: Physician Assistant

## 2022-05-08 DIAGNOSIS — J069 Acute upper respiratory infection, unspecified: Secondary | ICD-10-CM

## 2022-05-08 MED ORDER — BENZONATATE 100 MG PO CAPS: 100.0000 mg | ORAL_CAPSULE | Freq: Three times a day (TID) | ORAL | 0 refills | Status: DC | PRN

## 2022-05-08 NOTE — Progress Notes (Signed)
I have spent 5 minutes in review of e-visit questionnaire, review and updating patient chart, medical decision making and response to patient.   Maryjo Ragon Cody Flower Franko, PA-C    

## 2022-05-08 NOTE — Progress Notes (Signed)

## 2022-05-09 DIAGNOSIS — E785 Hyperlipidemia, unspecified: Secondary | ICD-10-CM | POA: Diagnosis not present

## 2022-05-09 DIAGNOSIS — N1831 Chronic kidney disease, stage 3a: Secondary | ICD-10-CM | POA: Diagnosis not present

## 2022-05-09 DIAGNOSIS — G2119 Other drug induced secondary parkinsonism: Secondary | ICD-10-CM | POA: Diagnosis not present

## 2022-05-09 DIAGNOSIS — M5416 Radiculopathy, lumbar region: Secondary | ICD-10-CM | POA: Diagnosis not present

## 2022-05-09 DIAGNOSIS — R0683 Snoring: Secondary | ICD-10-CM | POA: Diagnosis not present

## 2022-05-14 ENCOUNTER — Telehealth: Payer: Medicare HMO | Admitting: Family

## 2022-05-14 DIAGNOSIS — B9689 Other specified bacterial agents as the cause of diseases classified elsewhere: Secondary | ICD-10-CM

## 2022-05-14 MED ORDER — AZITHROMYCIN 250 MG PO TABS: ORAL_TABLET | ORAL | 0 refills | Status: DC

## 2022-05-14 MED ORDER — BENZONATATE 100 MG PO CAPS: 100.0000 mg | ORAL_CAPSULE | Freq: Three times a day (TID) | ORAL | 0 refills | Status: DC | PRN

## 2022-05-14 NOTE — Progress Notes (Signed)
We are sorry that you are not feeling well.  Here is how we plan to help!  Based on your presentation I believe you most likely have A cough due to bacteria.  When patients have a fever and a productive cough with a change in color or increased sputum production, we are concerned about bacterial bronchitis.  If left untreated it can progress to pneumonia.  If your symptoms do not improve with your treatment plan it is important that you contact your provider.   I have prescribed Azithromyin 250 mg: two tablets now and then one tablet daily for 4 additonal days    In addition you may use A non-prescription cough medication called Robitussin DAC. Take 2 teaspoons every 8 hours or Delsym: take 2 teaspoons every 12 hours., A non-prescription cough medication called Mucinex DM: take 2 tablets every 12 hours., and A prescription cough medication called Tessalon Perles 100mg. You may take 1-2 capsules every 8 hours as needed for your cough.    From your responses in the eVisit questionnaire you describe inflammation in the upper respiratory tract which is causing a significant cough.  This is commonly called Bronchitis and has four common causes:   Allergies Viral Infections Acid Reflux Bacterial Infection Allergies, viruses and acid reflux are treated by controlling symptoms or eliminating the cause. An example might be a cough caused by taking certain blood pressure medications. You stop the cough by changing the medication. Another example might be a cough caused by acid reflux. Controlling the reflux helps control the cough.  USE OF BRONCHODILATOR ("RESCUE") INHALERS: There is a risk from using your bronchodilator too frequently.  The risk is that over-reliance on a medication which only relaxes the muscles surrounding the breathing tubes can reduce the effectiveness of medications prescribed to reduce swelling and congestion of the tubes themselves.  Although you feel brief relief from the  bronchodilator inhaler, your asthma may actually be worsening with the tubes becoming more swollen and filled with mucus.  This can delay other crucial treatments, such as oral steroid medications. If you need to use a bronchodilator inhaler daily, several times per day, you should discuss this with your provider.  There are probably better treatments that could be used to keep your asthma under control.     HOME CARE Only take medications as instructed by your medical team. Complete the entire course of an antibiotic. Drink plenty of fluids and get plenty of rest. Avoid close contacts especially the very young and the elderly Cover your mouth if you cough or cough into your sleeve. Always remember to wash your hands A steam or ultrasonic humidifier can help congestion.   GET HELP RIGHT AWAY IF: You develop worsening fever. You become short of breath You cough up blood. Your symptoms persist after you have completed your treatment plan MAKE SURE YOU  Understand these instructions. Will watch your condition. Will get help right away if you are not doing well or get worse.    Thank you for choosing an e-visit.  Your e-visit answers were reviewed by a board certified advanced clinical practitioner to complete your personal care plan. Depending upon the condition, your plan could have included both over the counter or prescription medications.  Please review your pharmacy choice. Make sure the pharmacy is open so you can pick up prescription now. If there is a problem, you may contact your provider through MyChart messaging and have the prescription routed to another pharmacy.  Your safety is   important to us. If you have drug allergies check your prescription carefully.   For the next 24 hours you can use MyChart to ask questions about today's visit, request a non-urgent call back, or ask for a work or school excuse. You will get an email in the next two days asking about your experience. I  hope that your e-visit has been valuable and will speed your recovery.   Approximately 5 minutes was spent documenting and reviewing patient's chart.   

## 2022-06-09 ENCOUNTER — Other Ambulatory Visit: Payer: Self-pay | Admitting: Internal Medicine

## 2022-06-09 DIAGNOSIS — G8929 Other chronic pain: Secondary | ICD-10-CM

## 2022-06-09 NOTE — Telephone Encounter (Signed)
Requested medication (s) are due for refill today - yes  Requested medication (s) are on the active medication list -yes  Future visit scheduled -no  Last refill: 04/01/22 #90 1RF  Notes to clinic: non delegated Rx  Requested Prescriptions  Pending Prescriptions Disp Refills   cyclobenzaprine (FLEXERIL) 5 MG tablet [Pharmacy Med Name: CYCLOBENZAPRINE HYDROCHLORIDE 5 MG Tablet] 90 tablet 10    Sig: TAKE 1 TABLET THREE TIMES DAILY AS NEEDED FOR MUSCLE SPASM(S)     Not Delegated - Analgesics:  Muscle Relaxants Failed - 06/09/2022  2:59 AM      Failed - This refill cannot be delegated      Passed - Valid encounter within last 6 months    Recent Outpatient Visits           2 months ago Mixed hyperlipidemia   [place], [name], NP   8 months ago Encounter for general adult medical examination with abnormal findings   [facility] [place], [name], NP   1 year ago Chronic bilateral low back pain without sciatica   [facility] [place], [name], NP   1 year ago Annual physical exam   [facility], [name], FNP   2 years ago Acute non-recurrent sinusitis, unspecified location   [facility], [name], FNP       Future Appointments             In 3 months [name], [name], NP [facility], [facility]               Requested Prescriptions  Pending Prescriptions Disp Refills   cyclobenzaprine (FLEXERIL) 5 MG tablet [Pharmacy Med Name: CYCLOBENZAPRINE HYDROCHLORIDE 5 MG Tablet] 90 tablet 10    Sig: TAKE 1 TABLET THREE TIMES DAILY AS NEEDED FOR MUSCLE SPASM(S)     Not Delegated - Analgesics:  Muscle Relaxants Failed - 06/09/2022  2:59 AM      Failed - This refill cannot be delegated      Passed - Valid encounter within last 6 months    Recent Outpatient Visits           2 months ago Mixed hyperlipidemia   [place], [name], NP   8 months ago  Encounter for general adult medical examination with abnormal findings   [facility] [place], [name], NP   1 year ago Chronic bilateral low back pain without sciatica   [facility] [place], [name], NP   1 year ago Annual physical exam   [facility], [name], FNP   2 years ago Acute non-recurrent sinusitis, unspecified location   [facility], [name], FNP       Future Appointments             In 3 months [name], [name], NP [facility], [facility]

## 2022-06-23 ENCOUNTER — Other Ambulatory Visit: Payer: Self-pay | Admitting: Internal Medicine

## 2022-06-23 DIAGNOSIS — I10 Essential (primary) hypertension: Secondary | ICD-10-CM

## 2022-06-23 NOTE — Telephone Encounter (Signed)
Requested Prescriptions  Pending Prescriptions Disp Refills   losartan (COZAAR) 50 MG tablet [Pharmacy Med Name: LOSARTAN POTASSIUM 50 MG Tablet] 90 tablet 1    Sig: TAKE 1 TABLET EVERY DAY     Cardiovascular:  Angiotensin Receptor Blockers Failed - 06/23/2022  3:05 AM      Failed - Cr in normal range and within 180 days    Creat  Date Value Ref Range Status  03/27/2022 1.12 (H) 0.50 - 1.05 mg/dL Final         Failed - Last BP in normal range    BP Readings from Last 1 Encounters:  03/27/22 (!) 143/74         Passed - K in normal range and within 180 days    Potassium  Date Value Ref Range Status  03/27/2022 4.1 3.5 - 5.3 mmol/L Final         Passed - Patient is not pregnant      Passed - Valid encounter within last 6 months    Recent Outpatient Visits           2 months ago Mixed hyperlipidemia   [facility] [place], Salvadore Oxford, NP   8 months ago Encounter for general adult medical examination with abnormal findings   [facility] [place], Salvadore Oxford, NP   1 year ago Chronic bilateral low back pain without sciatica   [facility] [place], Salvadore Oxford, NP   1 year ago Annual physical exam   [facility], Jodelle Gross, FNP   2 years ago Acute non-recurrent sinusitis, unspecified location   [facility], Jodelle Gross, FNP       Future Appointments             In 3 months [name], Salvadore Oxford, NP [facility], [place]

## 2022-07-05 ENCOUNTER — Encounter: Payer: Self-pay | Admitting: Internal Medicine

## 2022-07-09 ENCOUNTER — Other Ambulatory Visit: Payer: Self-pay | Admitting: Internal Medicine

## 2022-07-09 DIAGNOSIS — G252 Other specified forms of tremor: Secondary | ICD-10-CM

## 2022-07-09 NOTE — Telephone Encounter (Signed)
Requested Prescriptions  Pending Prescriptions Disp Refills   propranolol ER (INDERAL LA) 120 MG 24 hr capsule [Pharmacy Med Name: PROPRANOLOL HYDROCHLORIDEER 120 MG Capsule Extended Release 24 Hour] 90 capsule 0    Sig: TAKE 1 CAPSULE EVERY DAY     Cardiovascular:  Beta Blockers Failed - 07/09/2022 12:41 PM      Failed - Last BP in normal range    BP Readings from Last 1 Encounters:  03/27/22 (!) 143/74         Passed - Last Heart Rate in normal range    Pulse Readings from Last 1 Encounters:  03/27/22 64         Passed - Valid encounter within last 6 months    Recent Outpatient Visits           3 months ago Mixed hyperlipidemia   [facility] [place], Salvadore Oxford, NP   9 months ago Encounter for general adult medical examination with abnormal findings   [facility] [place], Salvadore Oxford, NP   1 year ago Chronic bilateral low back pain without sciatica   [facility] [place], Salvadore Oxford, NP   1 year ago Annual physical exam   [facility], Jodelle Gross, FNP   2 years ago Acute non-recurrent sinusitis, unspecified location   [facility], Jodelle Gross, FNP       Future Appointments             In 2 months [name], Salvadore Oxford, NP [facility], [facility]

## 2022-07-16 ENCOUNTER — Ambulatory Visit (INDEPENDENT_AMBULATORY_CARE_PROVIDER_SITE_OTHER): Payer: Medicare HMO

## 2022-07-16 DIAGNOSIS — Z23 Encounter for immunization: Secondary | ICD-10-CM | POA: Diagnosis not present

## 2022-07-21 ENCOUNTER — Other Ambulatory Visit: Payer: Self-pay | Admitting: Internal Medicine

## 2022-07-21 DIAGNOSIS — K219 Gastro-esophageal reflux disease without esophagitis: Secondary | ICD-10-CM

## 2022-07-22 NOTE — Telephone Encounter (Signed)
Requested Prescriptions  Pending Prescriptions Disp Refills   pantoprazole (PROTONIX) 20 MG tablet [Pharmacy Med Name: PANTOPRAZOLE SODIUM 20 MG Tablet Delayed Release] 90 tablet 2    Sig: TAKE 1 TABLET EVERY DAY     Gastroenterology: Proton Pump Inhibitors Passed - 07/21/2022  4:05 AM      Passed - Valid encounter within last 12 months    Recent Outpatient Visits           3 months ago Mixed hyperlipidemia   [facility] [place], Salvadore Oxford, NP   9 months ago Encounter for general adult medical examination with abnormal findings   [facility] [place], Salvadore Oxford, NP   1 year ago Chronic bilateral low back pain without sciatica   [facility] [place], Salvadore Oxford, NP   1 year ago Annual physical exam   [facility], Jodelle Gross, FNP   2 years ago Acute non-recurrent sinusitis, unspecified location   [facility], Jodelle Gross, FNP       Future Appointments             In 2 months [name], Salvadore Oxford, NP [facility], [facility]

## 2022-07-24 DIAGNOSIS — M5416 Radiculopathy, lumbar region: Secondary | ICD-10-CM | POA: Diagnosis not present

## 2022-07-24 DIAGNOSIS — R0683 Snoring: Secondary | ICD-10-CM | POA: Diagnosis not present

## 2022-07-24 DIAGNOSIS — G2119 Other drug induced secondary parkinsonism: Secondary | ICD-10-CM | POA: Diagnosis not present

## 2022-07-31 ENCOUNTER — Other Ambulatory Visit: Payer: Self-pay | Admitting: Student

## 2022-07-31 DIAGNOSIS — M5416 Radiculopathy, lumbar region: Secondary | ICD-10-CM

## 2022-08-11 ENCOUNTER — Ambulatory Visit: Admission: RE | Admit: 2022-08-11 | Payer: Medicare HMO | Source: Ambulatory Visit

## 2022-08-16 ENCOUNTER — Telehealth: Payer: Medicare HMO | Admitting: Physician Assistant

## 2022-08-16 DIAGNOSIS — J019 Acute sinusitis, unspecified: Secondary | ICD-10-CM

## 2022-08-16 DIAGNOSIS — B9689 Other specified bacterial agents as the cause of diseases classified elsewhere: Secondary | ICD-10-CM | POA: Diagnosis not present

## 2022-08-17 NOTE — Progress Notes (Signed)

## 2022-08-18 ENCOUNTER — Telehealth: Payer: Medicare HMO | Admitting: Physician Assistant

## 2022-08-18 ENCOUNTER — Other Ambulatory Visit: Payer: Self-pay | Admitting: Internal Medicine

## 2022-08-18 DIAGNOSIS — B9689 Other specified bacterial agents as the cause of diseases classified elsewhere: Secondary | ICD-10-CM

## 2022-08-18 NOTE — Telephone Encounter (Signed)
Requested Prescriptions  Pending Prescriptions Disp Refills   atorvastatin (LIPITOR) 10 MG tablet [Pharmacy Med Name: ATORVASTATIN CALCIUM 10 MG Tablet] 90 tablet 2    Sig: TAKE 1 TABLET EVERY DAY     Cardiovascular:  Antilipid - Statins Failed - 08/18/2022  3:17 AM      Failed - Lipid Panel in normal range within the last 12 months    Cholesterol, Total  Date Value Ref Range Status  08/08/2015 253 (H) 100 - 199 mg/dL Final   Cholesterol  Date Value Ref Range Status  03/27/2022 168 <200 mg/dL Final   LDL Cholesterol (Calc)  Date Value Ref Range Status  03/27/2022 90 mg/dL (calc) Final    Comment:    Reference range: <100 . Desirable range <100 mg/dL for primary prevention;   <70 mg/dL for patients with CHD or diabetic patients  with > or = 2 CHD risk factors. [place] Kitchen LDL-C is now calculated using the Martin-Hopkins  calculation, which is a validated novel method providing  better accuracy than the Friedewald equation in the  estimation of LDL-C.  Cresenciano Genre et al. Annamaria Helling. 8466;599(35): 2061-2068  (http://education.[employer].com/faq/FAQ164)    HDL  Date Value Ref Range Status  03/27/2022 60 > OR = 50 mg/dL Final  08/08/2015 58 >39 mg/dL Final   Triglycerides  Date Value Ref Range Status  03/27/2022 86 <150 mg/dL Final         Passed - Patient is not pregnant      Passed - Valid encounter within last 12 months    Recent Outpatient Visits           4 months ago Mixed hyperlipidemia   [place], NP   10 months ago Encounter for general adult medical examination with abnormal findings   [facility] [place], [name], NP   1 year ago Chronic bilateral low back pain without sciatica   [facility] [place], [name], NP   1 year ago Annual physical exam   [facility], [name], FNP   2 years ago Acute non-recurrent sinusitis, unspecified location   [facility],  [name], FNP       Future Appointments             In 1 month [name], [name], NP [facility], [facility]

## 2022-08-19 ENCOUNTER — Other Ambulatory Visit: Payer: Self-pay | Admitting: Student

## 2022-08-19 DIAGNOSIS — M5416 Radiculopathy, lumbar region: Secondary | ICD-10-CM

## 2022-08-19 MED ORDER — AMOXICILLIN-POT CLAVULANATE 875-125 MG PO TABS: 1.0000 | ORAL_TABLET | Freq: Two times a day (BID) | ORAL | 0 refills | Status: DC

## 2022-08-19 NOTE — Progress Notes (Signed)
Duplicate -- seen Saturday. Medication not received by pharmacy so resent. No Charge.

## 2022-09-03 ENCOUNTER — Ambulatory Visit
Admission: RE | Admit: 2022-09-03 | Discharge: 2022-09-03 | Disposition: A | Discharge status: Home / Self Care | Payer: Medicare HMO | Source: Ambulatory Visit | Attending: Student | Admitting: Student

## 2022-09-03 DIAGNOSIS — M5416 Radiculopathy, lumbar region: Secondary | ICD-10-CM

## 2022-09-03 DIAGNOSIS — M5136 Other intervertebral disc degeneration, lumbar region: Secondary | ICD-10-CM | POA: Diagnosis not present

## 2022-09-03 DIAGNOSIS — M48061 Spinal stenosis, lumbar region without neurogenic claudication: Secondary | ICD-10-CM | POA: Diagnosis not present

## 2022-09-19 ENCOUNTER — Encounter: Payer: Self-pay | Admitting: Internal Medicine

## 2022-09-26 ENCOUNTER — Ambulatory Visit (INDEPENDENT_AMBULATORY_CARE_PROVIDER_SITE_OTHER): Payer: Medicare HMO

## 2022-09-26 VITALS — Ht 59.0 in | Wt 189.0 lb

## 2022-09-26 DIAGNOSIS — Z0000 Encounter for general adult medical examination without abnormal findings: Secondary | ICD-10-CM

## 2022-09-26 DIAGNOSIS — Z122 Encounter for screening for malignant neoplasm of respiratory organs: Secondary | ICD-10-CM

## 2022-09-26 NOTE — Progress Notes (Signed)
I connected with  Elizabeth [name] on 09/26/22 by a audio enabled telemedicine application and verified that I am speaking with the correct person using two identifiers.  Patient Location: Home  Provider Location: Office/Clinic  I discussed the limitations of evaluation and management by telemedicine. The patient expressed understanding and agreed to proceed.  Subjective:   Elizabeth [name] is a 67 y.o. female who presents for Medicare Annual (Subsequent) preventive examination.  Review of Systems    no Cardiac Risk Factors include: advanced age (>8mn, >>56women);dyslipidemia;hypertension;smoking/ tobacco exposure     Objective:    There were no vitals filed for this visit. There is no height or weight on file to calculate BMI.     09/26/2022   11:18 AM 04/05/2019    3:44 PM 03/23/2018    2:27 PM 02/17/2017    1:32 PM 05/30/2015    1:13 PM  Advanced Directives  Does Patient Have a Medical Advance Directive? No No No No Yes  Does patient want to make changes to medical advance directive?     Yes - information given  Would patient like information on creating a medical advance directive? No - Patient declined  Yes (MAU/Ambulatory/Procedural Areas - Information given) Yes (MAU/Ambulatory/Procedural Areas - Information given)     Current Medications (verified) Outpatient Encounter Medications as of 09/26/2022  Medication Sig   acetaminophen (TYLENOL) 500 MG tablet Take 1,000 mg by mouth daily as needed.   ARIPiprazole (ABILIFY) 5 MG tablet Take 1 tablet (5 mg total) by mouth daily.   aspirin 81 MG tablet Take 81 mg by mouth daily.   atorvastatin (LIPITOR) 10 MG tablet TAKE 1 TABLET EVERY DAY   Blood Pressure Monitoring (SM WRIST CUFF BP MONITOR) MISC 1 Units by Does not apply route daily.   carbidopa-levodopa (SINEMET IR) 25-100 MG tablet Take 1 tablet by mouth 3 (three) times daily.   chlorpheniramine (CHLOR-TRIMETON) 4 MG tablet Take 4 mg by mouth 2 (two) times daily  as needed for allergies.   citalopram (CELEXA) 40 MG tablet TAKE 1 TABLET EVERY DAY   cyclobenzaprine (FLEXERIL) 5 MG tablet TAKE 1 TABLET THREE TIMES DAILY AS NEEDED FOR MUSCLE SPASM(S)   fluticasone (FLONASE) 50 MCG/ACT nasal spray Place 2 sprays into both nostrils daily.   losartan (COZAAR) 50 MG tablet TAKE 1 TABLET EVERY DAY   Multiple Vitamin (MULTIVITAMIN) tablet Take 1 tablet by mouth daily.   Omega-3 Fatty Acids (FISH OIL) 1000 MG CAPS Take by mouth.   pantoprazole (PROTONIX) 20 MG tablet TAKE 1 TABLET EVERY DAY   amoxicillin-clavulanate (AUGMENTIN) 875-125 MG tablet Take 1 tablet by mouth 2 (two) times daily.   azithromycin (ZITHROMAX) 250 MG tablet Take 500 mg once, then 250 mg for four days   benzonatate (TESSALON PERLES) 100 MG capsule Take 1 capsule (100 mg total) by mouth 3 (three) times daily as needed. (Patient not taking: Reported on 09/26/2022)   lidocaine (XYLOCAINE) 2 % solution Swallow 587mevery 4-6 hours as needed for throat pain (Patient not taking: Reported on 09/26/2022)   propranolol ER (INDERAL LA) 120 MG 24 hr capsule TAKE 1 CAPSULE EVERY DAY   No facility-administered encounter medications on file as of 09/26/2022.    Allergies (verified) Ace inhibitors   History: Past Medical History:  Diagnosis Date   Allergy 08/04/1998   Anxiety    Arthritis 08/04/2008   CKD (chronic kidney disease) stage 3, GFR 30-59 ml/min (HCC) 03/04/2021   Depression    GERD (gastroesophageal reflux  disease)    Hypertension 11/02/1997   Insomnia    Mixed hyperlipidemia    Obesity    Past Surgical History:  Procedure Laterality Date   CHOLECYSTECTOMY  2004   repeat 2014   COLONOSCOPY  08/05/2011   Family History  Problem Relation Age of Onset   Breast cancer Maternal Aunt 33   Alzheimer's disease Mother    Stroke Father    Hypertension Father    Multiple sclerosis Sister    Hypertension Sister    Early death Sister    Colon cancer Sister    Liver cancer Sister    Breast  cancer Cousin        mat cousin   Parkinson's disease Paternal Uncle    Early death Sister    Hypertension Sister    Social History   Socioeconomic History   Marital status: Widowed    Spouse name: Not on file   Number of children: 2   Years of education: Not on file   Highest education level: High school graduate  Occupational History   Not on file  Tobacco Use   Smoking status: Every Day    Packs/day: 0.25    Years: 40.00    Total pack years: 10.00    Types: Cigarettes   Smokeless tobacco: Never   Tobacco comments:    pt smoking 8 cigs daily  Vaping Use   Vaping Use: Never used  Substance and Sexual Activity   Alcohol use: Yes    Comment: about once a month    Drug use: No   Sexual activity: Not Currently    Birth control/protection: Post-menopausal  Other Topics Concern   Not on file  Social History Narrative   Not on file   Social Determinants of Health   Financial Resource Strain: Low Risk  (09/26/2022)   Overall Financial Resource Strain (CARDIA)    Difficulty of Paying Living Expenses: Not very hard  Food Insecurity: No Food Insecurity (09/26/2022)   Hunger Vital Sign    Worried About Running Out of Food in the Last Year: Never true    Ran Out of Food in the Last Year: Never true  Transportation Needs: No Transportation Needs (09/26/2022)   PRAPARE - Transportation    Lack of Transportation (Medical): No    Lack of Transportation (Non-Medical): No  Physical Activity: Inactive (09/26/2022)   Exercise Vital Sign    Days of Exercise per Week: 0 days    Minutes of Exercise per Session: 0 min  Stress: No Stress Concern Present (09/26/2022)   [place]    Feeling of Stress : Not at all  Social Connections: Socially Isolated (09/26/2022)   Social Connection and Isolation Panel [NHANES]    Frequency of Communication with Friends and Family: More than three times a week    Frequency of Social  Gatherings with Friends and Family: Once a week    Attends Religious Services: Never    Marine scientist or Organizations: No    Attends Archivist Meetings: Never    Marital Status: Widowed    Tobacco Counseling Ready to quit: Not Answered Counseling given: Not Answered Tobacco comments: pt smoking 8 cigs daily   Clinical Intake:  Pre-visit preparation completed: Yes  Pain : No/denies pain     Diabetes: No  How often do you need to have someone help you when you read instructions, pamphlets, or other written materials from your doctor or  pharmacy?: 1 - Never  Diabetic?no  Interpreter Needed?: No  Information entered by :: Kirke Shaggy, LPN   Activities of Daily Living    09/26/2022   11:19 AM 09/19/2022   11:16 AM  In your present state of health, do you have any difficulty performing the following activities:  Hearing? 0 0  Vision? 0 0  Difficulty concentrating or making decisions? 0 0  Walking or climbing stairs? 1 1  Dressing or bathing? 0 0  Doing errands, shopping? 0 0  Preparing Food and eating ? N N  Using the Toilet? N N  In the past six months, have you accidently leaked urine? N N  Do you have problems with loss of bowel control? N N  Managing your Medications? N N  Managing your Finances? N N  Housekeeping or managing your Housekeeping? N N    Patient Care Team: Jearld Fenton, NP as PCP - General (Internal Medicine)  Indicate any recent Medical Services you may have received from other than [facility] providers in the past year (date may be approximate).     Assessment:   This is a routine wellness examination for Wanisha.  Hearing/Vision screen Hearing Screening - Comments:: No aids Vision Screening - Comments:: Wears glasses and contacts- Dr.Jackson at [place] issues and exercise activities discussed: Current Exercise Habits: The patient does not participate in regular exercise at present, Exercise limited by:  orthopedic condition(s) (her back)   Goals Addressed             This Visit's Progress    DIET - EAT MORE FRUITS AND VEGETABLES         Depression Screen    09/26/2022   11:16 AM 09/16/2021    2:25 PM 03/04/2021    1:30 PM 09/30/2019    1:54 PM 04/05/2019    3:40 PM 03/01/2019    1:18 PM 08/31/2018    2:25 PM  PHQ 2/9 Scores  PHQ - 2 Score 0 0 0 0 0 0 0  PHQ- 9 Score  0 0   1 1    Fall Risk    09/26/2022   11:18 AM 09/19/2022   11:16 AM 09/16/2021    2:30 PM 03/04/2021    1:30 PM 09/30/2019    1:54 PM  [place] in the past year? 0 0 0 0 1  Number falls in past yr: 0  0 0 0  Injury with Fall? 0 0 0 0 1  Risk for fall due to : No Fall Risks  No Fall Risks No Fall Risks Other (Comment)  Follow up Falls prevention discussed;Falls evaluation completed  Falls evaluation completed Falls evaluation completed     FALL RISK PREVENTION PERTAINING TO THE HOME:  Any stairs in or around the home? Yes  If so, are there any without handrails? No  Home free of loose throw rugs in walkways, pet beds, electrical cords, etc? Yes  Adequate lighting in your home to reduce risk of falls? Yes   ASSISTIVE DEVICES UTILIZED TO PREVENT FALLS:  Life alert? No  Use of a cane, walker or w/c? No  Grab bars in the bathroom? No  Shower chair or bench in shower? No  Elevated toilet seat or a handicapped toilet? No   Cognitive Function:    05/30/2015    1:26 PM  MMSE - Mini Mental State Exam  Orientation to time 5  Orientation to Place 5  Registration 3  Attention/ Calculation 5  Recall 3  Language- name 2 objects 2  Language- repeat 1  Language- follow 3 step command 3  Language- read & follow direction 1  Write a sentence 1  Copy design 1  Total score 30        09/26/2022   11:24 AM 09/16/2021    2:30 PM 03/23/2018    2:29 PM 02/17/2017    1:43 PM  6CIT Screen  What Year? 0 points 0 points 0 points 0 points  What month? 0 points 0 points 0 points 0 points  What time? 0  points 0 points 0 points 0 points  Count back from 20 0 points 0 points 0 points 0 points  Months in reverse 0 points 0 points 0 points 0 points  Repeat phrase 0 points 0 points 2 points 0 points  Total Score 0 points 0 points 2 points 0 points    Immunizations Immunization History  Administered Date(s) Administered   Fluad Quad(high Dose 65+) 09/27/2021, 07/16/2022   Influenza,inj,Quad PF,6+ Mos 05/30/2015, 04/29/2016, 05/05/2017, 05/26/2018, 05/06/2019, 08/27/2020   PNEUMOCOCCAL CONJUGATE-20 09/27/2021   Pneumococcal Polysaccharide-23 02/25/2013   Tdap 02/25/2013    TDAP status: Up to date  Flu Vaccine status: Up to date  Pneumococcal vaccine status: Up to date  Covid-19 vaccine status: Declined, Education has been provided regarding the importance of this vaccine but patient still declined. Advised may receive this vaccine at local pharmacy or Health Dept.or vaccine clinic. Aware to provide a copy of the vaccination record if obtained from local pharmacy or Health Dept. Verbalized acceptance and understanding.  Qualifies for Shingles Vaccine? Yes   Zostavax completed No   Shingrix Completed?: No.    Education has been provided regarding the importance of this vaccine. Patient has been advised to call insurance company to determine out of pocket expense if they have not yet received this vaccine. Advised may also receive vaccine at local pharmacy or Health Dept. Verbalized acceptance and understanding.  Screening Tests Health Maintenance  Topic Date Due   COVID-19 Vaccine (1) Never done   Zoster Vaccines- Shingrix (1 of 2) Never done   DTaP/Tdap/Td (2 - Td or Tdap) 02/26/2023   COLONOSCOPY (Pts 45-87yr Insurance coverage will need to be confirmed)  03/05/2023   Medicare Annual Wellness (AWV)  09/27/2023   MAMMOGRAM  02/27/2024   Pneumonia Vaccine 67 Years old  Completed   INFLUENZA VACCINE  Completed   DEXA SCAN  Completed   Hepatitis C Screening  Completed   HPV  VACCINES  Aged Out    Health Maintenance  Health Maintenance Due  Topic Date Due   COVID-19 Vaccine (1) Never done   Zoster Vaccines- Shingrix (1 of 2) Never done    Colorectal cancer screening: Type of screening: Colonoscopy. Completed 03/04/13. Repeat every 10 years  Mammogram status: Completed 02/26/22. Repeat every year  Bone Density status: Completed 10/31/20. Results reflect: Bone density results: OSTEOPENIA. Repeat every 5 years.  Lung Cancer Screening: (Low Dose CT Chest recommended if Age 67-80years, 30 pack-year currently smoking OR have quit w/in 15years.) does qualify.   Lung Cancer Screening Referral: referral sent  Additional Screening:  Hepatitis C Screening: does qualify; Completed 05/30/15  Vision Screening: Recommended annual ophthalmology exams for early detection of glaucoma and other disorders of the eye. Is the patient up to date with their annual eye exam?  Yes  Who is the provider or what is the name of the office in which the patient  attends annual eye exams? [facility] If pt is not established with a provider, would they like to be referred to a provider to establish care? No .   Dental Screening: Recommended annual dental exams for proper oral hygiene  Community Resource Referral / Chronic Care Management: CRR required this visit?  No   CCM required this visit?  No      Plan:     I have personally reviewed and noted the following in the patient's chart:   Medical and social history Use of alcohol, tobacco or illicit drugs  Current medications and supplements including opioid prescriptions. Patient is not currently taking opioid prescriptions. Functional ability and status Nutritional status Physical activity Advanced directives List of other physicians Hospitalizations, surgeries, and ER visits in previous 12 months Vitals Screenings to include cognitive, depression, and falls Referrals and appointments  In addition, I have reviewed  and discussed with patient certain preventive protocols, quality metrics, and best practice recommendations. A written personalized care plan for preventive services as well as general preventive health recommendations were provided to patient.     Dionisio David, LPN   [id number]   Nurse Notes: none

## 2022-09-26 NOTE — Patient Instructions (Signed)
Elizabeth [name] , Thank you for taking time to come for your Medicare Wellness Visit. I appreciate your ongoing commitment to your health goals. Please review the following plan we discussed and let me know if I can assist you in the future.   These are the goals we discussed:  Goals      DIET - EAT MORE FRUITS AND VEGETABLES     Quit smoking / using tobacco     Smoking cessation discussed     Weight < 200 lb (90.719 kg)     Pt wants to loose 20 pound in one year.        This is a list of the screening recommended for you and due dates:  Health Maintenance  Topic Date Due   COVID-19 Vaccine (1) Never done   Zoster (Shingles) Vaccine (1 of 2) Never done   DTaP/Tdap/Td vaccine (2 - Td or Tdap) 02/26/2023   Colon Cancer Screening  03/05/2023   Medicare Annual Wellness Visit  09/27/2023   Mammogram  02/27/2024   Pneumonia Vaccine  Completed   Flu Shot  Completed   DEXA scan (bone density measurement)  Completed   Hepatitis C Screening: USPSTF Recommendation to screen - Ages 18-79 yo.  Completed   HPV Vaccine  Aged Out    Advanced directives: no  Conditions/risks identified: none  Next appointment: Follow up in one year for your annual wellness visit 10/02/23 @ 10:00 am by phone   Preventive Care 65 Years and Older, Female Preventive care refers to lifestyle choices and visits with your health care provider that can promote health and wellness. What does preventive care include? A yearly physical exam. This is also called an annual well check. Dental exams once or twice a year. Routine eye exams. Ask your health care provider how often you should have your eyes checked. Personal lifestyle choices, including: Daily care of your teeth and gums. Regular physical activity. Eating a healthy diet. Avoiding tobacco and drug use. Limiting alcohol use. Practicing safe sex. Taking low-dose aspirin every day. Taking vitamin and mineral supplements as recommended by your health care  provider. What happens during an annual well check? The services and screenings done by your health care provider during your annual well check will depend on your age, overall health, lifestyle risk factors, and family history of disease. Counseling  Your health care provider may ask you questions about your: Alcohol use. Tobacco use. Drug use. Emotional well-being. Home and relationship well-being. Sexual activity. Eating habits. History of falls. Memory and ability to understand (cognition). Work and work Statistician. Reproductive health. Screening  You may have the following tests or measurements: Height, weight, and BMI. Blood pressure. Lipid and cholesterol levels. These may be checked every 5 years, or more frequently if you are over 25 years old. Skin check. Lung cancer screening. You may have this screening every year starting at age 67 if you have a 30-pack-year history of smoking and currently smoke or have quit within the past 15 years. Fecal occult blood test (FOBT) of the stool. You may have this test every year starting at age 67. Flexible sigmoidoscopy or colonoscopy. You may have a sigmoidoscopy every 5 years or a colonoscopy every 10 years starting at age 67. Hepatitis C blood test. Hepatitis B blood test. Sexually transmitted disease (STD) testing. Diabetes screening. This is done by checking your blood sugar (glucose) after you have not eaten for a while (fasting). You may have this done every 1-3 years. Bone  density scan. This is done to screen for osteoporosis. You may have this done starting at age 67. Mammogram. This may be done every 1-2 years. Talk to your health care provider about how often you should have regular mammograms. Talk with your health care provider about your test results, treatment options, and if necessary, the need for more tests. Vaccines  Your health care provider may recommend certain vaccines, such as: Influenza vaccine. This is  recommended every year. Tetanus, diphtheria, and acellular pertussis (Tdap, Td) vaccine. You may need a Td booster every 10 years. Zoster vaccine. You may need this after age 67. Pneumococcal 13-valent conjugate (PCV13) vaccine. One dose is recommended after age 67. Pneumococcal polysaccharide (PPSV23) vaccine. One dose is recommended after age 67. Talk to your health care provider about which screenings and vaccines you need and how often you need them. This information is not intended to replace advice given to you by your health care provider. Make sure you discuss any questions you have with your health care provider. Document Released: 08/17/2015 Document Revised: 04/09/2016 Document Reviewed: 05/22/2015 Elsevier Interactive Patient Education  2017 [employer] Prevention in the Home Falls can cause injuries. They can happen to people of all ages. There are many things you can do to make your home safe and to help prevent falls. What can I do on the outside of my home? Regularly fix the edges of walkways and driveways and fix any cracks. Remove anything that might make you trip as you walk through a door, such as a raised step or threshold. Trim any bushes or trees on the path to your home. Use bright outdoor lighting. Clear any walking paths of anything that might make someone trip, such as rocks or tools. Regularly check to see if handrails are loose or broken. Make sure that both sides of any steps have handrails. Any raised decks and porches should have guardrails on the edges. Have any leaves, snow, or ice cleared regularly. Use sand or salt on walking paths during winter. Clean up any spills in your garage right away. This includes oil or grease spills. What can I do in the bathroom? Use night lights. Install grab bars by the toilet and in the tub and shower. Do not use towel bars as grab bars. Use non-skid mats or decals in the tub or shower. If you need to sit down in  the shower, use a plastic, non-slip stool. Keep the floor dry. Clean up any water that spills on the floor as soon as it happens. Remove soap buildup in the tub or shower regularly. Attach bath mats securely with double-sided non-slip rug tape. Do not have throw rugs and other things on the floor that can make you trip. What can I do in the bedroom? Use night lights. Make sure that you have a light by your bed that is easy to reach. Do not use any sheets or blankets that are too big for your bed. They should not hang down onto the floor. Have a firm chair that has side arms. You can use this for support while you get dressed. Do not have throw rugs and other things on the floor that can make you trip. What can I do in the kitchen? Clean up any spills right away. Avoid walking on wet floors. Keep items that you use a lot in easy-to-reach places. If you need to reach something above you, use a strong step stool that has a grab bar. Keep  electrical cords out of the way. Do not use floor polish or wax that makes floors slippery. If you must use wax, use non-skid floor wax. Do not have throw rugs and other things on the floor that can make you trip. What can I do with my stairs? Do not leave any items on the stairs. Make sure that there are handrails on both sides of the stairs and use them. Fix handrails that are broken or loose. Make sure that handrails are as long as the stairways. Check any carpeting to make sure that it is firmly attached to the stairs. Fix any carpet that is loose or worn. Avoid having throw rugs at the top or bottom of the stairs. If you do have throw rugs, attach them to the floor with carpet tape. Make sure that you have a light switch at the top of the stairs and the bottom of the stairs. If you do not have them, ask someone to add them for you. What else can I do to help prevent falls? Wear shoes that: Do not have high heels. Have rubber bottoms. Are comfortable  and fit you well. Are closed at the toe. Do not wear sandals. If you use a stepladder: Make sure that it is fully opened. Do not climb a closed stepladder. Make sure that both sides of the stepladder are locked into place. Ask someone to hold it for you, if possible. Clearly mark and make sure that you can see: Any grab bars or handrails. First and last steps. Where the edge of each step is. Use tools that help you move around (mobility aids) if they are needed. These include: Canes. Walkers. Scooters. Crutches. Turn on the lights when you go into a dark area. Replace any light bulbs as soon as they burn out. Set up your furniture so you have a clear path. Avoid moving your furniture around. If any of your floors are uneven, fix them. If there are any pets around you, be aware of where they are. Review your medicines with your doctor. Some medicines can make you feel dizzy. This can increase your chance of falling. Ask your doctor what other things that you can do to help prevent falls. This information is not intended to replace advice given to you by your health care provider. Make sure you discuss any questions you have with your health care provider. Document Released: 05/17/2009 Document Revised: 12/27/2015 Document Reviewed: 08/25/2014 Elsevier Interactive Patient Education  2017 [employer].

## 2022-09-29 ENCOUNTER — Encounter: Payer: Medicare HMO | Admitting: Internal Medicine

## 2022-10-02 ENCOUNTER — Encounter: Payer: Self-pay | Admitting: Internal Medicine

## 2022-10-02 ENCOUNTER — Ambulatory Visit (INDEPENDENT_AMBULATORY_CARE_PROVIDER_SITE_OTHER): Payer: Medicare HMO | Admitting: Internal Medicine

## 2022-10-02 VITALS — BP 120/80 | HR 67 | Temp 96.8°F | Ht 59.0 in | Wt 203.0 lb

## 2022-10-02 DIAGNOSIS — Z1211 Encounter for screening for malignant neoplasm of colon: Secondary | ICD-10-CM

## 2022-10-02 DIAGNOSIS — F419 Anxiety disorder, unspecified: Secondary | ICD-10-CM

## 2022-10-02 DIAGNOSIS — Z6841 Body Mass Index (BMI) 40.0 and over, adult: Secondary | ICD-10-CM

## 2022-10-02 DIAGNOSIS — R739 Hyperglycemia, unspecified: Secondary | ICD-10-CM

## 2022-10-02 DIAGNOSIS — Z0001 Encounter for general adult medical examination with abnormal findings: Secondary | ICD-10-CM | POA: Diagnosis not present

## 2022-10-02 DIAGNOSIS — E66813 Morbid (severe) obesity with alveolar hypoventilation: Secondary | ICD-10-CM

## 2022-10-02 DIAGNOSIS — J069 Acute upper respiratory infection, unspecified: Secondary | ICD-10-CM

## 2022-10-02 DIAGNOSIS — F17200 Nicotine dependence, unspecified, uncomplicated: Secondary | ICD-10-CM

## 2022-10-02 DIAGNOSIS — E669 Obesity, unspecified: Secondary | ICD-10-CM | POA: Diagnosis not present

## 2022-10-02 DIAGNOSIS — R7309 Other abnormal glucose: Secondary | ICD-10-CM

## 2022-10-02 DIAGNOSIS — G252 Other specified forms of tremor: Secondary | ICD-10-CM

## 2022-10-02 DIAGNOSIS — E782 Mixed hyperlipidemia: Secondary | ICD-10-CM

## 2022-10-02 DIAGNOSIS — F17210 Nicotine dependence, cigarettes, uncomplicated: Secondary | ICD-10-CM | POA: Diagnosis not present

## 2022-10-02 DIAGNOSIS — E662 Morbid (severe) obesity with alveolar hypoventilation: Secondary | ICD-10-CM

## 2022-10-02 MED ORDER — PROPRANOLOL HCL ER 120 MG PO CP24: 120.0000 mg | ORAL_CAPSULE | Freq: Every day | ORAL | 1 refills | Status: DC

## 2022-10-02 MED ORDER — FLUTICASONE PROPIONATE 50 MCG/ACT NA SUSP: 2.0000 | Freq: Every day | NASAL | 0 refills | Status: DC

## 2022-10-02 MED ORDER — ARIPIPRAZOLE 5 MG PO TABS: 5.0000 mg | ORAL_TABLET | Freq: Every day | ORAL | 1 refills | Status: DC

## 2022-10-02 NOTE — Patient Instructions (Signed)
Health Maintenance for Postmenopausal Women Menopause is a normal process in which your ability to get pregnant comes to an end. This process happens slowly over many months or years, usually between the ages of 48 and 55. Menopause is complete when you have missed your menstrual period for 12 months. It is important to talk with your health care provider about some of the most common conditions that affect women after menopause (postmenopausal women). These include heart disease, cancer, and bone loss (osteoporosis). Adopting a healthy lifestyle and getting preventive care can help to promote your health and wellness. The actions you take can also lower your chances of developing some of these common conditions. What are the signs and symptoms of menopause? During menopause, you may have the following symptoms: Hot flashes. These can be moderate or severe. Night sweats. Decrease in sex drive. Mood swings. Headaches. Tiredness (fatigue). Irritability. Memory problems. Problems falling asleep or staying asleep. Talk with your health care provider about treatment options for your symptoms. Do I need hormone replacement therapy? Hormone replacement therapy is effective in treating symptoms that are caused by menopause, such as hot flashes and night sweats. Hormone replacement carries certain risks, especially as you become older. If you are thinking about using estrogen or estrogen with progestin, discuss the benefits and risks with your health care provider. How can I reduce my risk for heart disease and stroke? The risk of heart disease, heart attack, and stroke increases as you age. One of the causes may be a change in the body's hormones during menopause. This can affect how your body uses dietary fats, triglycerides, and cholesterol. Heart attack and stroke are medical emergencies. There are many things that you can do to help prevent heart disease and stroke. Watch your blood pressure High  blood pressure causes heart disease and increases the risk of stroke. This is more likely to develop in people who have high blood pressure readings or are overweight. Have your blood pressure checked: Every 3-5 years if you are 18-39 years of age. Every year if you are 40 years old or older. Eat a healthy diet  Eat a diet that includes plenty of vegetables, fruits, low-fat dairy products, and lean protein. Do not eat a lot of foods that are high in solid fats, added sugars, or sodium. Get regular exercise Get regular exercise. This is one of the most important things you can do for your health. Most adults should: Try to exercise for at least 150 minutes each week. The exercise should increase your heart rate and make you sweat (moderate-intensity exercise). Try to do strengthening exercises at least twice each week. Do these in addition to the moderate-intensity exercise. Spend less time sitting. Even light physical activity can be beneficial. Other tips Work with your health care provider to achieve or maintain a healthy weight. Do not use any products that contain nicotine or tobacco. These products include cigarettes, chewing tobacco, and vaping devices, such as e-cigarettes. If you need help quitting, ask your health care provider. Know your numbers. Ask your health care provider to check your cholesterol and your blood sugar (glucose). Continue to have your blood tested as directed by your health care provider. Do I need screening for cancer? Depending on your health history and family history, you may need to have cancer screenings at different stages of your life. This may include screening for: Breast cancer. Cervical cancer. Lung cancer. Colorectal cancer. What is my risk for osteoporosis? After menopause, you may be   at increased risk for osteoporosis. Osteoporosis is a condition in which bone destruction happens more quickly than new bone creation. To help prevent osteoporosis or  the bone fractures that can happen because of osteoporosis, you may take the following actions: If you are 19-50 years old, get at least 1,000 mg of calcium and at least 600 international units (IU) of vitamin D per day. If you are older than age 50 but younger than age 70, get at least 1,200 mg of calcium and at least 600 international units (IU) of vitamin D per day. If you are older than age 70, get at least 1,200 mg of calcium and at least 800 international units (IU) of vitamin D per day. Smoking and drinking excessive alcohol increase the risk of osteoporosis. Eat foods that are rich in calcium and vitamin D, and do weight-bearing exercises several times each week as directed by your health care provider. How does menopause affect my mental health? Depression may occur at any age, but it is more common as you become older. Common symptoms of depression include: Feeling depressed. Changes in sleep patterns. Changes in appetite or eating patterns. Feeling an overall lack of motivation or enjoyment of activities that you previously enjoyed. Frequent crying spells. Talk with your health care provider if you think that you are experiencing any of these symptoms. General instructions See your health care provider for regular wellness exams and vaccines. This may include: Scheduling regular health, dental, and eye exams. Getting and maintaining your vaccines. These include: Influenza vaccine. Get this vaccine each year before the flu season begins. Pneumonia vaccine. Shingles vaccine. Tetanus, diphtheria, and pertussis (Tdap) booster vaccine. Your health care provider may also recommend other immunizations. Tell your health care provider if you have ever been abused or do not feel safe at home. Summary Menopause is a normal process in which your ability to get pregnant comes to an end. This condition causes hot flashes, night sweats, decreased interest in sex, mood swings, headaches, or lack  of sleep. Treatment for this condition may include hormone replacement therapy. Take actions to keep yourself healthy, including exercising regularly, eating a healthy diet, watching your weight, and checking your blood pressure and blood sugar levels. Get screened for cancer and depression. Make sure that you are up to date with all your vaccines. This information is not intended to replace advice given to you by your health care provider. Make sure you discuss any questions you have with your health care provider. Document Revised: 12/10/2020 Document Reviewed: 12/10/2020 Elsevier Patient Education  2023 [employer]  

## 2022-10-02 NOTE — Assessment & Plan Note (Signed)
Encouraged diet and exercise for weight loss ?

## 2022-10-02 NOTE — Progress Notes (Signed)
Subjective:    Patient ID: Elizabeth [name], female    DOB: [birth year], 67 y.o.   MRN: [id number]  HPI  Patient presents to clinic today for her annual exam.  Flu: 07/2022 Tetanus: 02/2013 COVID: never Pneumovax: 02/2013 Prevnar: 09/2021 Shingrix: never Pap smear: 05/2021 Mammogram: 02/2022 Bone density: 10/2020 Colon screening: 02/2013 Vision screening: annually Dentist: as needed  Diet: She does eat meat. She consumes fruits and veggies. She tries to avoid fried foods. She drinks mostly soda. Exercise: None   Review of Systems     Past Medical History:  Diagnosis Date   Allergy 08/04/1998   Anxiety    Arthritis 08/04/2008   CKD (chronic kidney disease) stage 3, GFR 30-59 ml/min (HCC) 03/04/2021   Depression    GERD (gastroesophageal reflux disease)    Hypertension 11/02/1997   Insomnia    Mixed hyperlipidemia    Obesity     Current Outpatient Medications  Medication Sig Dispense Refill   acetaminophen (TYLENOL) 500 MG tablet Take 1,000 mg by mouth daily as needed.     amoxicillin-clavulanate (AUGMENTIN) 875-125 MG tablet Take 1 tablet by mouth 2 (two) times daily. 14 tablet 0   ARIPiprazole (ABILIFY) 5 MG tablet Take 1 tablet (5 mg total) by mouth daily. 90 tablet 1   aspirin 81 MG tablet Take 81 mg by mouth daily.     atorvastatin (LIPITOR) 10 MG tablet TAKE 1 TABLET EVERY DAY 90 tablet 2   azithromycin (ZITHROMAX) 250 MG tablet Take 500 mg once, then 250 mg for four days 6 tablet 0   benzonatate (TESSALON PERLES) 100 MG capsule Take 1 capsule (100 mg total) by mouth 3 (three) times daily as needed. (Patient not taking: Reported on 09/26/2022) 20 capsule 0   Blood Pressure Monitoring (SM WRIST CUFF BP MONITOR) MISC 1 Units by Does not apply route daily. 1 each 0   carbidopa-levodopa (SINEMET IR) 25-100 MG tablet Take 1 tablet by mouth 3 (three) times daily.     chlorpheniramine (CHLOR-TRIMETON) 4 MG tablet Take 4 mg by mouth 2 (two) times daily as needed for  allergies.     citalopram (CELEXA) 40 MG tablet TAKE 1 TABLET EVERY DAY 90 tablet 1   cyclobenzaprine (FLEXERIL) 5 MG tablet TAKE 1 TABLET THREE TIMES DAILY AS NEEDED FOR MUSCLE SPASM(S) 90 tablet 1   fluticasone (FLONASE) 50 MCG/ACT nasal spray Place 2 sprays into both nostrils daily. 16 g 0   lidocaine (XYLOCAINE) 2 % solution Swallow 45m every 4-6 hours as needed for throat pain (Patient not taking: Reported on 09/26/2022) 100 mL 0   losartan (COZAAR) 50 MG tablet TAKE 1 TABLET EVERY DAY 90 tablet 1   Multiple Vitamin (MULTIVITAMIN) tablet Take 1 tablet by mouth daily.     Omega-3 Fatty Acids (FISH OIL) 1000 MG CAPS Take by mouth.     pantoprazole (PROTONIX) 20 MG tablet TAKE 1 TABLET EVERY DAY 90 tablet 2   propranolol ER (INDERAL LA) 120 MG 24 hr capsule TAKE 1 CAPSULE EVERY DAY 90 capsule 0   No current facility-administered medications for this visit.    Allergies  Allergen Reactions   Ace Inhibitors Cough    Family History  Problem Relation Age of Onset   Breast cancer Maternal Aunt [id number]  Alzheimer's disease Mother    Stroke Father    Hypertension Father    Multiple sclerosis Sister    Hypertension Sister    Early death Sister    Colon cancer Sister  Liver cancer Sister    Breast cancer Cousin        mat cousin   Parkinson's disease Paternal Uncle    Early death Sister    Hypertension Sister     Social History   Socioeconomic History   Marital status: Widowed    Spouse name: Not on file   Number of children: 2   Years of education: Not on file   Highest education level: High school graduate  Occupational History   Not on file  Tobacco Use   Smoking status: Every Day    Packs/day: 0.25    Years: 40.00    Total pack years: 10.00    Types: Cigarettes   Smokeless tobacco: Never   Tobacco comments:    pt smoking 8 cigs daily  Vaping Use   Vaping Use: Never used  Substance and Sexual Activity   Alcohol use: Yes    Comment: about once a month    Drug use:  No   Sexual activity: Not Currently    Birth control/protection: Post-menopausal  Other Topics Concern   Not on file  Social History Narrative   Not on file   Social Determinants of Health   Financial Resource Strain: Low Risk  (09/26/2022)   Overall Financial Resource Strain (CARDIA)    Difficulty of Paying Living Expenses: Not very hard  Food Insecurity: No Food Insecurity (09/26/2022)   Hunger Vital Sign    Worried About Running Out of Food in the Last Year: Never true    Ran Out of Food in the Last Year: Never true  Transportation Needs: No Transportation Needs (09/26/2022)   PRAPARE - Transportation    Lack of Transportation (Medical): No    Lack of Transportation (Non-Medical): No  Physical Activity: Inactive (09/26/2022)   Exercise Vital Sign    Days of Exercise per Week: 0 days    Minutes of Exercise per Session: 0 min  Stress: No Stress Concern Present (09/26/2022)   [place]    Feeling of Stress : Not at all  Social Connections: Socially Isolated (09/26/2022)   Social Connection and Isolation Panel [NHANES]    Frequency of Communication with Friends and Family: More than three times a week    Frequency of Social Gatherings with Friends and Family: Once a week    Attends Religious Services: Never    Marine scientist or Organizations: No    Attends Archivist Meetings: Never    Marital Status: Widowed  Intimate Partner Violence: Not At Risk (09/26/2022)   Humiliation, Afraid, Rape, and Kick questionnaire    Fear of Current or Ex-Partner: No    Emotionally Abused: No    Physically Abused: No    Sexually Abused: No     Constitutional: Denies fever, malaise, fatigue, headache or abrupt weight changes.  HEENT: Denies eye pain, eye redness, ear pain, ringing in the ears, wax buildup, runny nose, nasal congestion, bloody nose, or sore throat. Respiratory: Denies difficulty breathing,  shortness of breath, cough or sputum production.   Cardiovascular: Denies chest pain, chest tightness, palpitations or swelling in the hands or feet.  Gastrointestinal: Denies abdominal pain, bloating, constipation, diarrhea or blood in the stool.  GU: Denies urgency, frequency, pain with urination, burning sensation, blood in urine, odor or discharge. Musculoskeletal: Patient reports chronic joint pain.  Denies decrease in range of motion, difficulty with gait, muscle pain or joint swelling.  Skin: Denies redness, rashes, lesions  or ulcercations.  Neurological: Denies dizziness, difficulty with memory, difficulty with speech or problems with balance and coordination.  Psych: Patient has a history of anxiety and depression.  Denies SI/HI.  No other specific complaints in a complete review of systems (except as listed in HPI above).  Objective:   Physical Exam  BP 120/80 (BP Location: Right Arm, Patient Position: Sitting, Cuff Size: Large)   Pulse 67   Temp (!) 96.8 F (36 C) (Temporal)   Ht '4\' 11"'$  (1.499 m)   Wt 203 lb (92.1 kg)   SpO2 98%   BMI 41.00 kg/m   Wt Readings from Last 3 Encounters:  09/26/22 189 lb (85.7 kg)  03/27/22 189 lb (85.7 kg)  09/27/21 191 lb (86.6 kg)    General: Appears her stated age, obese, in NAD. Skin: Warm, dry and intact.  HEENT: Head: normal shape and size; Eyes: sclera white, no icterus, conjunctiva pink, PERRLA and EOMs intact;  Neck:  Neck supple, trachea midline. No masses, lumps or thyromegaly present.  Cardiovascular: Normal rate and rhythm. S1,S2 noted.  No murmur, rubs or gallops noted. No JVD or BLE edema. No carotid bruits noted. Pulmonary/Chest: Normal effort and positive vesicular breath sounds. No respiratory distress. No wheezes, rales or ronchi noted.  Abdomen: Soft and nontender. Normal bowel sounds.  Musculoskeletal: Strength 5/5 BUE/BLE. No difficulty with gait.  Neurological: Alert and oriented. Cranial nerves II-XII grossly  intact. Coordination normal.  Psychiatric: Mood and affect normal. Behavior is normal. Judgment and thought content normal.     BMET    Component Value Date/Time   NA 141 03/27/2022 1349   NA 145 (H) 04/09/2017 1431   K 4.1 03/27/2022 1349   CL 106 03/27/2022 1349   CO2 23 03/27/2022 1349   GLUCOSE 94 03/27/2022 1349   BUN 17 03/27/2022 1349   BUN 6 (L) 04/09/2017 1431   CREATININE 1.12 (H) 03/27/2022 1349   CALCIUM 10.0 03/27/2022 1349   GFRNONAA 45 (L) 09/04/2020 0941   GFRAA 52 (L) 09/04/2020 0941    Lipid Panel     Component Value Date/Time   CHOL 168 03/27/2022 1349   CHOL 253 (H) 08/08/2015 1323   TRIG 86 03/27/2022 1349   HDL 60 03/27/2022 1349   HDL 58 08/08/2015 1323   CHOLHDL 2.8 03/27/2022 1349   VLDL 27 03/03/2017 1058   LDLCALC 90 03/27/2022 1349    CBC    Component Value Date/Time   WBC 8.4 09/27/2021 1420   RBC 3.85 09/27/2021 1420   HGB 12.5 09/27/2021 1420   HGB 11.6 04/09/2017 1431   HCT 36.6 09/27/2021 1420   HCT 34.5 04/09/2017 1431   PLT 338 09/27/2021 1420   PLT 376 04/09/2017 1431   MCV 95.1 09/27/2021 1420   MCV 92 04/09/2017 1431   MCH 32.5 09/27/2021 1420   MCHC 34.2 09/27/2021 1420   RDW 11.8 09/27/2021 1420   RDW 13.7 04/09/2017 1431   LYMPHSABS 1,699 09/04/2020 0941   LYMPHSABS 1.6 04/09/2017 1431   MONOABS 385 03/03/2017 1058   EOSABS 158 09/04/2020 0941   EOSABS 0.2 04/09/2017 1431   BASOSABS 79 09/04/2020 0941   BASOSABS 0.0 04/09/2017 1431    Hgb A1C Lab Results  Component Value Date   HGBA1C 5.2 03/04/2021           Assessment & Plan:   Preventative Health Maintenance:  Encouraged her to get a flu shot in the fall Tetanus UTD Encouraged her to get her COVID-vaccine Pneumovax  and Prevnar UTD Discussed Shingrix vaccine, she will check coverage with her insurance company and schedule visit if she would like to have this done Pap smear UTD Mammogram UTD Bone density UTD Referral to GI for screening  colonoscopy Encouraged her to consume a balanced diet and exercise regimen Advised her to see an eye doctor and dentist annually Will check CBC, c-Met, lipid, A1c today  RTC in 6 months, follow-up chronic conditions Webb Silversmith, NP

## 2022-10-07 ENCOUNTER — Other Ambulatory Visit: Payer: Self-pay

## 2022-10-07 DIAGNOSIS — Z0001 Encounter for general adult medical examination with abnormal findings: Secondary | ICD-10-CM

## 2022-10-07 DIAGNOSIS — R7309 Other abnormal glucose: Secondary | ICD-10-CM

## 2022-10-07 DIAGNOSIS — E782 Mixed hyperlipidemia: Secondary | ICD-10-CM

## 2022-10-08 ENCOUNTER — Other Ambulatory Visit: Payer: Medicare HMO

## 2022-10-08 DIAGNOSIS — Z0001 Encounter for general adult medical examination with abnormal findings: Secondary | ICD-10-CM | POA: Diagnosis not present

## 2022-10-08 DIAGNOSIS — E782 Mixed hyperlipidemia: Secondary | ICD-10-CM | POA: Diagnosis not present

## 2022-10-08 DIAGNOSIS — R7309 Other abnormal glucose: Secondary | ICD-10-CM | POA: Diagnosis not present

## 2022-10-09 LAB — COMPLETE METABOLIC PANEL WITH GFR
AG Ratio: 1.5 (calc) (ref 1.0–2.5)
ALT: 14 U/L (ref 6–29)
AST: 16 U/L (ref 10–35)
Albumin: 4.3 g/dL (ref 3.6–5.1)
Alkaline phosphatase (APISO): 81 U/L (ref 37–153)
BUN/Creatinine Ratio: 13 (calc) (ref 6–22)
BUN: 17 mg/dL (ref 7–25)
CO2: 25 mmol/L (ref 20–32)
Calcium: 10 mg/dL (ref 8.6–10.4)
Chloride: 107 mmol/L (ref 98–110)
Creat: 1.29 mg/dL — ABNORMAL HIGH (ref 0.50–1.05)
Globulin: 2.9 g/dL (calc) (ref 1.9–3.7)
Glucose, Bld: 97 mg/dL (ref 65–99)
Potassium: 4.5 mmol/L (ref 3.5–5.3)
Sodium: 140 mmol/L (ref 135–146)
Total Bilirubin: 0.5 mg/dL (ref 0.2–1.2)
Total Protein: 7.2 g/dL (ref 6.1–8.1)
eGFR: 46 mL/min/{1.73_m2} — ABNORMAL LOW (ref >=60)

## 2022-10-09 LAB — LIPID PANEL
Cholesterol: 162 mg/dL (ref <200)
HDL: 57 mg/dL (ref >=50)
LDL Cholesterol (Calc): 88 mg/dL (calc)
Non-HDL Cholesterol (Calc): 105 mg/dL (calc) (ref <130)
Total CHOL/HDL Ratio: 2.8 (calc) (ref <5.0)
Triglycerides: 80 mg/dL (ref <150)

## 2022-10-09 LAB — CBC
HCT: 39.6 % (ref 35.0–45.0)
Hemoglobin: 13.2 g/dL (ref 11.7–15.5)
MCH: 30.6 pg (ref 27.0–33.0)
MCHC: 33.3 g/dL (ref 32.0–36.0)
MCV: 91.7 fL (ref 80.0–100.0)
MPV: 10 fL (ref 7.5–12.5)
Platelets: 359 10*3/uL (ref 140–400)
RBC: 4.32 10*6/uL (ref 3.80–5.10)
RDW: 12.1 % (ref 11.0–15.0)
WBC: 6.5 10*3/uL (ref 3.8–10.8)

## 2022-10-09 LAB — HEMOGLOBIN A1C
Hgb A1c MFr Bld: 5.7 % of total Hgb — ABNORMAL HIGH (ref <5.7)
Mean Plasma Glucose: 117 mg/dL
eAG (mmol/L): 6.5 mmol/L

## 2022-10-10 ENCOUNTER — Other Ambulatory Visit: Payer: Self-pay

## 2022-10-10 ENCOUNTER — Telehealth: Payer: Self-pay

## 2022-10-10 DIAGNOSIS — Z1211 Encounter for screening for malignant neoplasm of colon: Secondary | ICD-10-CM

## 2022-10-10 MED ORDER — NA SULFATE-K SULFATE-MG SULF 17.5-3.13-1.6 GM/177ML PO SOLN: 1.0000 | Freq: Once | ORAL | 0 refills | Status: AC

## 2022-10-10 NOTE — Telephone Encounter (Signed)
Gastroenterology Pre-Procedure Review  Request Date: 03/19/23 Requesting Physician: Dr. Vicente Males  PATIENT REVIEW QUESTIONS: The patient responded to the following health history questions as indicated:    1. Are you having any GI issues? no 2. Do you have a personal history of Polyps unsure-last colonoscopy was 03/03/2013  3. Do you have a family history of Colon Cancer or Polyps? no 4. Diabetes Mellitus? no 5. Joint replacements in the past 12 months?no 6. Major health problems in the past 3 months?no 7. Any artificial heart valves, MVP, or defibrillator?no    MEDICATIONS & ALLERGIES:    Patient reports the following regarding taking any anticoagulation/antiplatelet therapy:   Plavix, Coumadin, Eliquis, Xarelto, Lovenox, Pradaxa, Brilinta, or Effient? no Aspirin? yes (81 mg daily)  Patient confirms/reports the following medications:  Current Outpatient Medications  Medication Sig Dispense Refill   acetaminophen (TYLENOL) 500 MG tablet Take 1,000 mg by mouth daily as needed.     ARIPiprazole (ABILIFY) 5 MG tablet Take 1 tablet (5 mg total) by mouth daily. 90 tablet 1   aspirin 81 MG tablet Take 81 mg by mouth daily.     atorvastatin (LIPITOR) 10 MG tablet TAKE 1 TABLET EVERY DAY 90 tablet 2   Blood Pressure Monitoring (SM WRIST CUFF BP MONITOR) MISC 1 Units by Does not apply route daily. 1 each 0   carbidopa-levodopa (SINEMET IR) 25-100 MG tablet Take 1 tablet by mouth 3 (three) times daily.     cetirizine (ZYRTEC) 10 MG tablet Take 10 mg by mouth daily.     chlorpheniramine (CHLOR-TRIMETON) 4 MG tablet Take 4 mg by mouth 2 (two) times daily as needed for allergies.     citalopram (CELEXA) 40 MG tablet TAKE 1 TABLET EVERY DAY 90 tablet 1   cyclobenzaprine (FLEXERIL) 5 MG tablet TAKE 1 TABLET THREE TIMES DAILY AS NEEDED FOR MUSCLE SPASM(S) 90 tablet 1   fluticasone (FLONASE) 50 MCG/ACT nasal spray Place 2 sprays into both nostrils daily. 16 g 0   lidocaine (XYLOCAINE) 2 % solution  Swallow 43m every 4-6 hours as needed for throat pain 100 mL 0   losartan (COZAAR) 50 MG tablet TAKE 1 TABLET EVERY DAY 90 tablet 1   Multiple Vitamin (MULTIVITAMIN) tablet Take 1 tablet by mouth daily.     Omega-3 Fatty Acids (FISH OIL) 1000 MG CAPS Take by mouth.     pantoprazole (PROTONIX) 20 MG tablet TAKE 1 TABLET EVERY DAY 90 tablet 2   propranolol ER (INDERAL LA) 120 MG 24 hr capsule Take 1 capsule (120 mg total) by mouth daily. 90 capsule 1   No current facility-administered medications for this visit.    Patient confirms/reports the following allergies:  Allergies  Allergen Reactions   Ace Inhibitors Cough    No orders of the defined types were placed in this encounter.   AUTHORIZATION INFORMATION Primary Insurance: 1D#: Group #:  Secondary Insurance: 1D#: Group #:  SCHEDULE INFORMATION: Date: 03/19/23 Time: Location: [facility]

## 2022-10-23 DIAGNOSIS — G2119 Other drug induced secondary parkinsonism: Secondary | ICD-10-CM | POA: Diagnosis not present

## 2022-10-23 DIAGNOSIS — M5416 Radiculopathy, lumbar region: Secondary | ICD-10-CM | POA: Diagnosis not present

## 2022-10-25 ENCOUNTER — Other Ambulatory Visit: Payer: Self-pay | Admitting: Internal Medicine

## 2022-10-25 DIAGNOSIS — J069 Acute upper respiratory infection, unspecified: Secondary | ICD-10-CM

## 2022-10-27 NOTE — Telephone Encounter (Signed)
Requested medication (s) are due for refill today - provider review   Requested medication (s) are on the active medication list -yes  Future visit scheduled -yes  Last refill: 10/02/22 16g  Notes to clinic: Last Rx at [facility] written without RF- is this something provider wants to continue? Sent for review   Requested Prescriptions  Pending Prescriptions Disp Refills   fluticasone (FLONASE) 50 MCG/ACT nasal spray [Pharmacy Med Name: FLUTICASONE PROPIONATE 50 MCG/ACT Suspension] 16 g 11    Sig: USE 2 SPRAYS IN EACH NOSTRIL EVERY DAY     Ear, Nose, and Throat: Nasal Preparations - Corticosteroids Passed - 10/25/2022  2:24 AM      Passed - Valid encounter within last 12 months    Recent Outpatient Visits           3 weeks ago Encounter for general adult medical examination with abnormal findings   [facility] [place], [name], NP   7 months ago Mixed hyperlipidemia   [facility] [place], [name], NP   1 year ago Encounter for general adult medical examination with abnormal findings   [facility] [place], [name], NP   1 year ago Chronic bilateral low back pain without sciatica   [facility] [place], [name], NP   2 years ago Annual physical exam   [facility] [name], [name], FNP       Future Appointments             In 5 months [name], [name], NP [facility], [facility]               Requested Prescriptions  Pending Prescriptions Disp Refills   fluticasone (FLONASE) 50 MCG/ACT nasal spray [Pharmacy Med Name: FLUTICASONE PROPIONATE 50 MCG/ACT Suspension] 16 g 11    Sig: USE 2 SPRAYS IN EACH NOSTRIL EVERY DAY     Ear, Nose, and Throat: Nasal Preparations - Corticosteroids Passed - 10/25/2022  2:24 AM      Passed - Valid encounter within last 12 months    Recent Outpatient Visits           3 weeks ago Encounter  for general adult medical examination with abnormal findings   [facility] [place], [name], NP   7 months ago Mixed hyperlipidemia   [facility] [place], [name], NP   1 year ago Encounter for general adult medical examination with abnormal findings   [facility] [place], [name], NP   1 year ago Chronic bilateral low back pain without sciatica   [facility] [place], [name], NP   2 years ago Annual physical exam   [facility] [name], [name], FNP       Future Appointments             In 5 months [name], [name], NP [facility], [facility]

## 2022-11-12 ENCOUNTER — Other Ambulatory Visit: Payer: Self-pay | Admitting: Internal Medicine

## 2022-11-12 DIAGNOSIS — I10 Essential (primary) hypertension: Secondary | ICD-10-CM

## 2022-11-12 NOTE — Telephone Encounter (Signed)
Requested Prescriptions  Pending Prescriptions Disp Refills   losartan (COZAAR) 50 MG tablet [Pharmacy Med Name: LOSARTAN POTASSIUM 50 MG Tablet] 90 tablet 0    Sig: TAKE 1 TABLET EVERY DAY     Cardiovascular:  Angiotensin Receptor Blockers Failed - 11/12/2022  2:32 AM      Failed - Cr in normal range and within 180 days    Creat  Date Value Ref Range Status  10/08/2022 1.29 (H) 0.50 - 1.05 mg/dL Final         Passed - K in normal range and within 180 days    Potassium  Date Value Ref Range Status  10/08/2022 4.5 3.5 - 5.3 mmol/L Final         Passed - Patient is not pregnant      Passed - Last BP in normal range    BP Readings from Last 1 Encounters:  10/02/22 120/80         Passed - Valid encounter within last 6 months    Recent Outpatient Visits           1 month ago Encounter for general adult medical examination with abnormal findings   [place] [facility] [place], Salvadore Oxford, NP   7 months ago Mixed hyperlipidemia   [place] [facility] [place], Salvadore Oxford, NP   1 year ago Encounter for general adult medical examination with abnormal findings   [place] [place], Salvadore Oxford, NP   1 year ago Chronic bilateral low back pain without sciatica   [place] [facility] [place], Salvadore Oxford, NP   2 years ago Annual physical exam   [place] [facility], Jodelle Gross, FNP       Future Appointments             In 4 months [name], Salvadore Oxford, NP [place] [facility], [facility]

## 2023-01-10 ENCOUNTER — Other Ambulatory Visit: Payer: Self-pay | Admitting: Internal Medicine

## 2023-01-10 DIAGNOSIS — J069 Acute upper respiratory infection, unspecified: Secondary | ICD-10-CM

## 2023-01-12 DIAGNOSIS — M5416 Radiculopathy, lumbar region: Secondary | ICD-10-CM | POA: Diagnosis not present

## 2023-01-12 DIAGNOSIS — G2119 Other drug induced secondary parkinsonism: Secondary | ICD-10-CM | POA: Diagnosis not present

## 2023-01-12 DIAGNOSIS — Y92009 Unspecified place in unspecified non-institutional (private) residence as the place of occurrence of the external cause: Secondary | ICD-10-CM | POA: Diagnosis not present

## 2023-01-12 DIAGNOSIS — F39 Unspecified mood [affective] disorder: Secondary | ICD-10-CM | POA: Diagnosis not present

## 2023-01-12 DIAGNOSIS — W19XXXS Unspecified fall, sequela: Secondary | ICD-10-CM | POA: Diagnosis not present

## 2023-01-12 NOTE — Telephone Encounter (Signed)
Requested Prescriptions  Pending Prescriptions Disp Refills   fluticasone (FLONASE) 50 MCG/ACT nasal spray [Pharmacy Med Name: FLUTICASONE PROPIONATE 50 MCG/ACT Suspension] 16 g 0    Sig: USE 1 SPRAY IN EACH NOSTRIL EVERY DAY     Ear, Nose, and Throat: Nasal Preparations - Corticosteroids Passed - 01/10/2023  4:07 AM      Passed - Valid encounter within last 12 months    Recent Outpatient Visits           3 months ago Encounter for general adult medical examination with abnormal findings   [place] [place], Salvadore Oxford, NP   9 months ago Mixed hyperlipidemia   [place] [facility] [place], Salvadore Oxford, NP   1 year ago Encounter for general adult medical examination with abnormal findings   [place] [facility] [place], Salvadore Oxford, NP   1 year ago Chronic bilateral low back pain without sciatica   [place] [facility] [place], Salvadore Oxford, NP   2 years ago Annual physical exam   [place] [facility], Jodelle Gross, FNP       Future Appointments             In 2 months [name], Salvadore Oxford, NP [place] [facility], [facility]

## 2023-01-21 ENCOUNTER — Encounter: Payer: Self-pay | Admitting: Emergency Medicine

## 2023-01-24 ENCOUNTER — Other Ambulatory Visit: Payer: Self-pay | Admitting: Internal Medicine

## 2023-01-24 DIAGNOSIS — I10 Essential (primary) hypertension: Secondary | ICD-10-CM

## 2023-01-24 DIAGNOSIS — F419 Anxiety disorder, unspecified: Secondary | ICD-10-CM

## 2023-01-26 NOTE — Telephone Encounter (Signed)
Wrong office

## 2023-01-26 NOTE — Telephone Encounter (Signed)
Requested Prescriptions  Pending Prescriptions Disp Refills   losartan (COZAAR) 50 MG tablet [Pharmacy Med Name: LOSARTAN POTASSIUM 50 MG Tablet] 90 tablet 1    Sig: TAKE 1 TABLET EVERY DAY     Cardiovascular:  Angiotensin Receptor Blockers Failed - 01/24/2023  2:31 AM      Failed - Cr in normal range and within 180 days    Creat  Date Value Ref Range Status  10/08/2022 1.29 (H) 0.50 - 1.05 mg/dL Final         Passed - K in normal range and within 180 days    Potassium  Date Value Ref Range Status  10/08/2022 4.5 3.5 - 5.3 mmol/L Final         Passed - Patient is not pregnant      Passed - Last BP in normal range    BP Readings from Last 1 Encounters:  10/02/22 120/80         Passed - Valid encounter within last 6 months    Recent Outpatient Visits           3 months ago Encounter for general adult medical examination with abnormal findings   [place] [facility] [place], Salvadore Oxford, NP   10 months ago Mixed hyperlipidemia   [place] [facility] [place], Salvadore Oxford, NP   1 year ago Encounter for general adult medical examination with abnormal findings   [place] [facility] [place], Salvadore Oxford, NP   1 year ago Chronic bilateral low back pain without sciatica   [place] [facility] [place], Salvadore Oxford, NP   2 years ago Annual physical exam   [place] [facility], Jodelle Gross, FNP       Future Appointments             In 2 months [name], Salvadore Oxford, NP [facility], [facility]             citalopram (CELEXA) 40 MG tablet [Pharmacy Med Name: CITALOPRAM HYDROBROMIDE 40 MG Tablet] 90 tablet 1    Sig: TAKE 1 TABLET EVERY DAY     Psychiatry:  Antidepressants - SSRI Passed - 01/24/2023  2:31 AM      Passed - Valid encounter within last 6 months    Recent Outpatient Visits           3 months ago Encounter for general adult medical examination with abnormal findings    [place] [facility] [place], Salvadore Oxford, NP   10 months ago Mixed hyperlipidemia   [facility] [place], Salvadore Oxford, NP   1 year ago Encounter for general adult medical examination with abnormal findings   [place] [facility] [place], Salvadore Oxford, NP   1 year ago Chronic bilateral low back pain without sciatica   [place] [facility] [place], Salvadore Oxford, NP   2 years ago Annual physical exam   [place] [facility], Jodelle Gross, FNP       Future Appointments             In 2 months [name], Salvadore Oxford, NP [place] [facility], [facility]

## 2023-02-26 ENCOUNTER — Other Ambulatory Visit: Payer: Self-pay | Admitting: Internal Medicine

## 2023-02-26 ENCOUNTER — Telehealth: Payer: Medicare PPO | Admitting: Physician Assistant

## 2023-02-26 DIAGNOSIS — B9789 Other viral agents as the cause of diseases classified elsewhere: Secondary | ICD-10-CM

## 2023-02-26 DIAGNOSIS — J019 Acute sinusitis, unspecified: Secondary | ICD-10-CM | POA: Diagnosis not present

## 2023-02-26 DIAGNOSIS — K219 Gastro-esophageal reflux disease without esophagitis: Secondary | ICD-10-CM

## 2023-02-26 MED ORDER — PREDNISONE 10 MG (21) PO TBPK: ORAL_TABLET | ORAL | 0 refills | Status: DC

## 2023-02-26 NOTE — Progress Notes (Signed)
E-Visit for Sinus Problems  We are sorry that you are not feeling well.  Here is how we plan to help!  Based on what you have shared with me it looks like you have sinusitis.  Sinusitis is inflammation and infection in the sinus cavities of the head.  Based on your presentation I believe you most likely have Acute Viral Sinusitis.This is an infection most likely caused by a virus. There is not specific treatment for viral sinusitis other than to help you with the symptoms until the infection runs its course.  You may use an oral decongestant such as Mucinex D or if you have glaucoma or high blood pressure use plain Mucinex. Saline nasal spray help and can safely be used as often as needed for congestion, I have prescribed: a steroid pack to reduce sinus inflammation.  Some authorities believe that zinc sprays or the use of Echinacea may shorten the course of your symptoms.  Sinus infections are not as easily transmitted as other respiratory infection, however we still recommend that you avoid close contact with loved ones, especially the very young and elderly.  Remember to wash your hands thoroughly throughout the day as this is the number one way to prevent the spread of infection!  Home Care: Only take medications as instructed by your medical team. Do not take these medications with alcohol. A steam or ultrasonic humidifier can help congestion.  You can place a towel over your head and breathe in the steam from hot water coming from a faucet. Avoid close contacts especially the very young and the elderly. Cover your mouth when you cough or sneeze. Always remember to wash your hands.  Get Help Right Away If: You develop worsening fever or sinus pain. You develop a severe head ache or visual changes. Your symptoms persist after you have completed your treatment plan.  Make sure you Understand these instructions. Will watch your condition. Will get help right away if you are not doing well  or get worse.   Thank you for choosing an e-visit.  Your e-visit answers were reviewed by a board certified advanced clinical practitioner to complete your personal care plan. Depending upon the condition, your plan could have included both over the counter or prescription medications.  Please review your pharmacy choice. Make sure the pharmacy is open so you can pick up prescription now. If there is a problem, you may contact your provider through [employer] and have the prescription routed to another pharmacy.  Your safety is important to [place]. If you have drug allergies check your prescription carefully.   For the next 24 hours you can use MyChart to ask questions about today's visit, request a non-urgent call back, or ask for a work or school excuse. You will get an email in the next two days asking about your experience. I hope that your e-visit has been valuable and will speed your recovery.

## 2023-02-26 NOTE — Progress Notes (Signed)
I have spent 5 minutes in review of e-visit questionnaire, review and updating patient chart, medical decision making and response to patient.   William Cody Martin, PA-C    

## 2023-02-26 NOTE — Telephone Encounter (Signed)
Requested by interface surescripts. Future visit in 1 month Requested Prescriptions  Pending Prescriptions Disp Refills   pantoprazole (PROTONIX) 20 MG tablet [Pharmacy Med Name: PANTOPRAZOLE SODIUM 20 MG Tablet Delayed Release] 90 tablet 2    Sig: TAKE 1 TABLET EVERY DAY     Gastroenterology: Proton Pump Inhibitors Passed - 02/26/2023  2:36 AM      Passed - Valid encounter within last 12 months    Recent Outpatient Visits           4 months ago Encounter for general adult medical examination with abnormal findings   [place] [facility] [place], Salvadore Oxford, NP   11 months ago Mixed hyperlipidemia   [place] [facility] [place], Salvadore Oxford, NP   1 year ago Encounter for general adult medical examination with abnormal findings   [place] [facility] [place], Salvadore Oxford, NP   1 year ago Chronic bilateral low back pain without sciatica   [place] [facility] [place], Salvadore Oxford, NP   2 years ago Annual physical exam   [place] [facility], Jodelle Gross, FNP       Future Appointments             In 1 month [name], Salvadore Oxford, NP [place] [facility], [facility]

## 2023-03-06 ENCOUNTER — Other Ambulatory Visit: Payer: Self-pay | Admitting: Internal Medicine

## 2023-03-06 DIAGNOSIS — J069 Acute upper respiratory infection, unspecified: Secondary | ICD-10-CM

## 2023-03-09 NOTE — Telephone Encounter (Signed)
Requested Prescriptions  Pending Prescriptions Disp Refills   fluticasone (FLONASE) 50 MCG/ACT nasal spray [Pharmacy Med Name: FLUTICASONE PROPIONATE 50 MCG/ACT Suspension] 16 g 2    Sig: USE 1 SPRAY IN EACH NOSTRIL EVERY DAY     Ear, Nose, and Throat: Nasal Preparations - Corticosteroids Passed - 03/06/2023 11:34 PM      Passed - Valid encounter within last 12 months    Recent Outpatient Visits           5 months ago Encounter for general adult medical examination with abnormal findings   [place] [facility] [place], Salvadore Oxford, NP   11 months ago Mixed hyperlipidemia   [place] [facility] [place], Salvadore Oxford, NP   1 year ago Encounter for general adult medical examination with abnormal findings   [place] [facility] [place], [place] W, NP   2 years ago Chronic bilateral low back pain without sciatica   [place] [facility] [place], Salvadore Oxford, NP   2 years ago Annual physical exam   [place] [facility], Jodelle Gross, FNP       Future Appointments             In 3 weeks [name], Salvadore Oxford, NP [place] [facility] [place], [facility]

## 2023-03-11 ENCOUNTER — Telehealth: Payer: Self-pay

## 2023-03-11 NOTE — Telephone Encounter (Signed)
Received staff message from [place] in Endo patient has requested to reschedule her colonoscopy with Dr. Tobi Bastos currently scheduled on 03/19/23. She has requested a Tue/Thurs  Left voice message for her to call office back to reschedule.  Thanks, [place], [place]

## 2023-03-16 DIAGNOSIS — H2513 Age-related nuclear cataract, bilateral: Secondary | ICD-10-CM | POA: Diagnosis not present

## 2023-03-18 ENCOUNTER — Other Ambulatory Visit: Payer: Self-pay

## 2023-03-18 ENCOUNTER — Other Ambulatory Visit: Payer: Self-pay | Admitting: Internal Medicine

## 2023-03-18 DIAGNOSIS — Z1231 Encounter for screening mammogram for malignant neoplasm of breast: Secondary | ICD-10-CM

## 2023-03-18 DIAGNOSIS — Z1211 Encounter for screening for malignant neoplasm of colon: Secondary | ICD-10-CM

## 2023-03-19 ENCOUNTER — Ambulatory Visit: Admit: 2023-03-19 | Payer: Medicare HMO | Admitting: Gastroenterology

## 2023-03-19 SURGERY — COLONOSCOPY WITH PROPOFOL
Anesthesia: General

## 2023-03-20 ENCOUNTER — Other Ambulatory Visit: Payer: Self-pay | Admitting: Internal Medicine

## 2023-03-20 DIAGNOSIS — G252 Other specified forms of tremor: Secondary | ICD-10-CM

## 2023-03-20 NOTE — Telephone Encounter (Signed)
Requested Prescriptions  Pending Prescriptions Disp Refills   propranolol ER (INDERAL LA) 120 MG 24 hr capsule [Pharmacy Med Name: PROPRANOLOL HCL ER 120 MG Capsule Extended Release 24 Hour] 90 capsule 0    Sig: TAKE 1 CAPSULE EVERY DAY     Cardiovascular:  Beta Blockers Passed - 03/20/2023  2:44 AM      Passed - Last BP in normal range    BP Readings from Last 1 Encounters:  10/02/22 120/80         Passed - Last Heart Rate in normal range    Pulse Readings from Last 1 Encounters:  10/02/22 67         Passed - Valid encounter within last 6 months    Recent Outpatient Visits           5 months ago Encounter for general adult medical examination with abnormal findings   [place] [facility] [place], Salvadore Oxford, NP   11 months ago Mixed hyperlipidemia   [place] [facility] [place], Salvadore Oxford, NP   1 year ago Encounter for general adult medical examination with abnormal findings   [place] [facility] [place], [place] W, NP   2 years ago Chronic bilateral low back pain without sciatica   [place] [facility] [place], Salvadore Oxford, NP   2 years ago Annual physical exam   [place] [facility], Jodelle Gross, FNP       Future Appointments             In 3 weeks [name], Salvadore Oxford, NP [place] [facility], [facility]

## 2023-03-23 ENCOUNTER — Ambulatory Visit
Admission: RE | Admit: 2023-03-23 | Discharge: 2023-03-23 | Disposition: A | Discharge status: Home / Self Care | Payer: Medicare PPO | Source: Ambulatory Visit | Attending: Internal Medicine | Admitting: Internal Medicine

## 2023-03-23 DIAGNOSIS — Z1231 Encounter for screening mammogram for malignant neoplasm of breast: Secondary | ICD-10-CM | POA: Insufficient documentation

## 2023-03-26 ENCOUNTER — Other Ambulatory Visit: Payer: Self-pay | Admitting: Internal Medicine

## 2023-03-26 NOTE — Telephone Encounter (Signed)
Requested Prescriptions  Pending Prescriptions Disp Refills   atorvastatin (LIPITOR) 10 MG tablet [Pharmacy Med Name: ATORVASTATIN CALCIUM 10 MG Tablet] 90 tablet 1    Sig: TAKE 1 TABLET EVERY DAY     Cardiovascular:  Antilipid - Statins Failed - 03/26/2023  3:04 AM      Failed - Lipid Panel in normal range within the last 12 months    Cholesterol, Total  Date Value Ref Range Status  08/08/2015 253 (H) 100 - 199 mg/dL Final   Cholesterol  Date Value Ref Range Status  10/08/2022 162 <200 mg/dL Final   LDL Cholesterol (Calc)  Date Value Ref Range Status  10/08/2022 88 mg/dL (calc) Final    Comment:    Reference range: <100 . Desirable range <100 mg/dL for primary prevention;   <70 mg/dL for patients with CHD or diabetic patients  with > or = 2 CHD risk factors. Marland Kitchen LDL-C is now calculated using the Martin-Hopkins  calculation, which is a validated novel method providing  better accuracy than the Friedewald equation in the  estimation of LDL-C.  Horald Pollen et al. Lenox Ahr. 6578;469(62): 2061-2068  (http://education.Quest[employer]ostics.com/faq/FAQ164)    HDL  Date Value Ref Range Status  10/08/2022 57 > OR = 50 mg/dL Final  95/28/4132 58 >44 mg/dL Final   Triglycerides  Date Value Ref Range Status  10/08/2022 80 <150 mg/dL Final         Passed - Patient is not pregnant      Passed - Valid encounter within last 12 months    Recent Outpatient Visits           5 months ago Encounter for general adult medical examination with abnormal findings   [place] [facility] [place], Salvadore Oxford, NP   12 months ago Mixed hyperlipidemia   [place] [facility] [place], Salvadore Oxford, NP   1 year ago Encounter for general adult medical examination with abnormal findings   [place] [facility] [place], [place] W, NP   2 years ago Chronic bilateral low back pain without sciatica   [place] [facility] [place], Salvadore Oxford, NP   2 years  ago Annual physical exam   [place] [facility], Jodelle Gross, FNP       Future Appointments             In 2 weeks [place], Salvadore Oxford, NP [place] [facility], [facility]

## 2023-04-02 ENCOUNTER — Ambulatory Visit: Payer: Medicare HMO | Admitting: Internal Medicine

## 2023-04-15 ENCOUNTER — Ambulatory Visit (INDEPENDENT_AMBULATORY_CARE_PROVIDER_SITE_OTHER): Payer: Medicare PPO | Admitting: Internal Medicine

## 2023-04-15 ENCOUNTER — Encounter: Payer: Self-pay | Admitting: Internal Medicine

## 2023-04-15 VITALS — BP 124/78 | HR 64 | Temp 96.8°F | Wt 199.0 lb

## 2023-04-15 DIAGNOSIS — M545 Low back pain, unspecified: Secondary | ICD-10-CM

## 2023-04-15 DIAGNOSIS — R7303 Prediabetes: Secondary | ICD-10-CM

## 2023-04-15 DIAGNOSIS — G252 Other specified forms of tremor: Secondary | ICD-10-CM | POA: Diagnosis not present

## 2023-04-15 DIAGNOSIS — Z6841 Body Mass Index (BMI) 40.0 and over, adult: Secondary | ICD-10-CM

## 2023-04-15 DIAGNOSIS — Z23 Encounter for immunization: Secondary | ICD-10-CM

## 2023-04-15 DIAGNOSIS — F419 Anxiety disorder, unspecified: Secondary | ICD-10-CM

## 2023-04-15 DIAGNOSIS — M159 Polyosteoarthritis, unspecified: Secondary | ICD-10-CM

## 2023-04-15 DIAGNOSIS — F39 Unspecified mood [affective] disorder: Secondary | ICD-10-CM

## 2023-04-15 DIAGNOSIS — K219 Gastro-esophageal reflux disease without esophagitis: Secondary | ICD-10-CM

## 2023-04-15 DIAGNOSIS — M85851 Other specified disorders of bone density and structure, right thigh: Secondary | ICD-10-CM

## 2023-04-15 DIAGNOSIS — E662 Morbid (severe) obesity with alveolar hypoventilation: Secondary | ICD-10-CM

## 2023-04-15 DIAGNOSIS — E782 Mixed hyperlipidemia: Secondary | ICD-10-CM | POA: Diagnosis not present

## 2023-04-15 DIAGNOSIS — G2119 Other drug induced secondary parkinsonism: Secondary | ICD-10-CM | POA: Diagnosis not present

## 2023-04-15 DIAGNOSIS — I10 Essential (primary) hypertension: Secondary | ICD-10-CM

## 2023-04-15 DIAGNOSIS — N1831 Chronic kidney disease, stage 3a: Secondary | ICD-10-CM

## 2023-04-15 DIAGNOSIS — G8929 Other chronic pain: Secondary | ICD-10-CM

## 2023-04-15 MED ORDER — LOSARTAN POTASSIUM 50 MG PO TABS
50.0000 mg | ORAL_TABLET | Freq: Every day | ORAL | 1 refills | Status: DC
Start: 2023-04-15 — End: 2023-10-09

## 2023-04-15 MED ORDER — CITALOPRAM HYDROBROMIDE 40 MG PO TABS
40.0000 mg | ORAL_TABLET | Freq: Every day | ORAL | 1 refills | Status: DC
Start: 2023-04-15 — End: 2023-10-09

## 2023-04-15 MED ORDER — ARIPIPRAZOLE 5 MG PO TABS
5.0000 mg | ORAL_TABLET | Freq: Every day | ORAL | 1 refills | Status: DC
Start: 2023-04-15 — End: 2023-10-09

## 2023-04-15 MED ORDER — PROPRANOLOL HCL ER 120 MG PO CP24
120.0000 mg | ORAL_CAPSULE | Freq: Every day | ORAL | 1 refills | Status: DC
Start: 2023-04-15 — End: 2023-10-09

## 2023-04-15 NOTE — Assessment & Plan Note (Signed)
Stable on citalopram and Abilify, refilled today Support offered

## 2023-04-15 NOTE — Assessment & Plan Note (Signed)
Controlled on losartan and propranolol, refilled today Reinforced DASH diet and exercise for weight loss C-Met today

## 2023-04-15 NOTE — Assessment & Plan Note (Signed)
C-Met today Continue losartan for renal protection

## 2023-04-15 NOTE — Assessment & Plan Note (Signed)
Encourage diet and exercise for weight loss 

## 2023-04-15 NOTE — Assessment & Plan Note (Signed)
Avoid foods that trigger reflux Encourage weight loss as this can help reduce reflux symptoms Continue pantoprazole

## 2023-04-15 NOTE — Progress Notes (Unsigned)
Subjective:    Patient ID: Elizabeth [name], female    DOB: Apr 27, 1956, 67 y.o.   MRN: [id number]  HPI  Patient presents to clinic today for 72-month follow-up of chronic conditions.  HTN: Her BP today is 124/78.  She is taking losartan and propranolol as prescribed.  There is no ECG on file.  HLD: Her last LDL was 88, triglycerides 80, 10/2022.  She denies myalgias on atorvastatin.  She tries to consume a low-fat diet.  Anxiety and depression: Chronic, managed on citalopram and abilify.  She is not currently seeing a therapist.  She denies SI/HI.  OA/chronic back pain: Managed with lyrica, turmeric, voltaren gel, tylenol and cyclobenzaprine.  She does not follow with orthopedics.  GERD: Triggered by spicy foods.  She denies breakthrough on pantoprazole.  There is no upper GI on file.  Osteopenia: She is taking calcium and vitamin D OTC.  She does not get much weightbearing exercise daily.  Bone density from 10/2020 reviewed.  Drug-induced parkinsonism: She reports mainly a tremor.  She is taking sinemet and propranolol as prescribed.  She follows with neurology.  CKD 3: Her last creatinine was 1.29, GFR 46, 09/2022.  She is on losartan for renal protection.  She does not follow with nephrology.  Prediabetes: Her last A1c was 5.7%.  She is not taking any oral diabetic medication at this time.  She does not check her sugars.  Review of Systems     Past Medical History:  Diagnosis Date   Allergy 08/04/1998   Anxiety    Arthritis 08/04/2008   CKD (chronic kidney disease) stage 3, GFR 30-59 ml/min (HCC) 03/04/2021   Depression    GERD (gastroesophageal reflux disease)    Hypertension 11/02/1997   Insomnia    Mixed hyperlipidemia    Obesity     Current Outpatient Medications  Medication Sig Dispense Refill   acetaminophen (TYLENOL) 500 MG tablet Take 1,000 mg by mouth daily as needed.     ARIPiprazole (ABILIFY) 5 MG tablet Take 1 tablet (5 mg total) by mouth daily. 90 tablet 1    aspirin 81 MG tablet Take 81 mg by mouth daily.     atorvastatin (LIPITOR) 10 MG tablet TAKE 1 TABLET EVERY DAY 90 tablet 1   Blood Pressure Monitoring (SM WRIST CUFF BP MONITOR) MISC 1 Units by Does not apply route daily. 1 each 0   carbidopa-levodopa (SINEMET IR) 25-100 MG tablet Take 1 tablet by mouth 3 (three) times daily.     cetirizine (ZYRTEC) 10 MG tablet Take 10 mg by mouth daily.     chlorpheniramine (CHLOR-TRIMETON) 4 MG tablet Take 4 mg by mouth 2 (two) times daily as needed for allergies.     citalopram (CELEXA) 40 MG tablet TAKE 1 TABLET EVERY DAY 90 tablet 1   cyclobenzaprine (FLEXERIL) 5 MG tablet TAKE 1 TABLET THREE TIMES DAILY AS NEEDED FOR MUSCLE SPASM(S) 90 tablet 1   fluticasone (FLONASE) 50 MCG/ACT nasal spray USE 1 SPRAY IN EACH NOSTRIL EVERY DAY 16 g 2   lidocaine (XYLOCAINE) 2 % solution Swallow 5mL every 4-6 hours as needed for throat pain 100 mL 0   losartan (COZAAR) 50 MG tablet TAKE 1 TABLET EVERY DAY 90 tablet 1   Multiple Vitamin (MULTIVITAMIN) tablet Take 1 tablet by mouth daily.     Omega-3 Fatty Acids (FISH OIL) 1000 MG CAPS Take by mouth.     pantoprazole (PROTONIX) 20 MG tablet TAKE 1 TABLET EVERY DAY 90 tablet 2  predniSONE (STERAPRED UNI-PAK 21 TAB) 10 MG (21) TBPK tablet Take following package directions 21 tablet 0   propranolol ER (INDERAL LA) 120 MG 24 hr capsule TAKE 1 CAPSULE EVERY DAY 90 capsule 0   No current facility-administered medications for this visit.    Allergies  Allergen Reactions   Ace Inhibitors Cough    Family History  Problem Relation Age of Onset   Breast cancer Maternal Aunt 22   Alzheimer's disease Mother    Stroke Father    Hypertension Father    Multiple sclerosis Sister    Hypertension Sister    Early death Sister    Colon cancer Sister    Liver cancer Sister    Breast cancer Cousin        mat cousin   Parkinson's disease Paternal Uncle    Early death Sister    Hypertension Sister     Social History    Socioeconomic History   Marital status: Widowed    Spouse name: Not on file   Number of children: 2   Years of education: Not on file   Highest education level: 12th grade  Occupational History   Not on file  Tobacco Use   Smoking status: Former    Current packs/day: 0.00    Average packs/day: 0.3 packs/day for 40.0 years (10.0 ttl pk-yrs)    Types: Cigarettes    Start date: 07/28/1982    Quit date: 07/28/2022    Years since quitting: 0.7   Smokeless tobacco: Never   Tobacco comments:    pt smoking 8 cigs daily  Vaping Use   Vaping status: Every Day  Substance and Sexual Activity   Alcohol use: Yes    Comment: about once a month    Drug use: No   Sexual activity: Not Currently    Birth control/protection: Post-menopausal  Other Topics Concern   Not on file  Social History Narrative   Not on file   Social Determinants of Health   Financial Resource Strain: Low Risk  (04/14/2023)   Overall Financial Resource Strain (CARDIA)    Difficulty of Paying Living Expenses: Not hard at all  Food Insecurity: No Food Insecurity (04/14/2023)   Hunger Vital Sign    Worried About Running Out of Food in the Last Year: Never true    Ran Out of Food in the Last Year: Never true  Transportation Needs: No Transportation Needs (04/14/2023)   PRAPARE - Administrator, Civil Service (Medical): No    Lack of Transportation (Non-Medical): No  Physical Activity: Inactive (04/14/2023)   Exercise Vital Sign    Days of Exercise per Week: 0 days    Minutes of Exercise per Session: 0 min  Stress: No Stress Concern Present (04/14/2023)   [employer] of Occupational Health - Occupational Stress Questionnaire    Feeling of Stress : Not at all  Social Connections: Moderately Isolated (04/14/2023)   Social Connection and Isolation Panel [NHANES]    Frequency of Communication with Friends and Family: More than three times a week    Frequency of Social Gatherings with Friends and  Family: Twice a week    Attends Religious Services: More than 4 times per year    Active Member of [employer] or Organizations: No    Attends Banker Meetings: Never    Marital Status: Widowed  Intimate Partner Violence: Not At Risk (09/26/2022)   Humiliation, Afraid, Rape, and Kick questionnaire    Fear of Current or  Ex-Partner: No    Emotionally Abused: No    Physically Abused: No    Sexually Abused: No     Constitutional: Denies fever, malaise, fatigue, headache or abrupt weight changes.  HEENT: Denies eye pain, eye redness, ear pain, ringing in the ears, wax buildup, runny nose, nasal congestion, bloody nose, or sore throat. Respiratory: Denies difficulty breathing, shortness of breath, cough or sputum production.   Cardiovascular: Denies chest pain, chest tightness, palpitations or swelling in the hands or feet.  Gastrointestinal: Denies abdominal pain, bloating, constipation, diarrhea or blood in the stool.  GU: Denies urgency, frequency, pain with urination, burning sensation, blood in urine, odor or discharge. Musculoskeletal: Patient reports joint pain.  Denies decrease in range of motion, difficulty with gait, muscle pain or joint swelling.  Skin: Denies redness, rashes, lesions or ulcercations.  Neurological: Patient reports tremor.  Denies dizziness, difficulty with memory, difficulty with speech or problems with balance and coordination.  Psych: Patient has a history of anxiety and depression.  Denies SI/HI.  No other specific complaints in a complete review of systems (except as listed in HPI above).  Objective:   Physical Exam  BP 124/78 (BP Location: Right Arm, Patient Position: Sitting, Cuff Size: Normal)   Pulse 64   Temp (!) 96.8 F (36 C) (Temporal)   Wt 199 lb (90.3 kg)   SpO2 96%   BMI 40.19 kg/m    Wt Readings from Last 3 Encounters:  10/02/22 203 lb (92.1 kg)  09/26/22 189 lb (85.7 kg)  03/27/22 189 lb (85.7 kg)    General: Appears her  stated age, obese, in NAD. Skin: Warm, dry and intact.  HEENT: Head: normal shape and size; Eyes: sclera white, no icterus, conjunctiva pink, PERRLA and EOMs intact;  Cardiovascular: Normal rate and rhythm. S1,S2 noted.  No murmur, rubs or gallops noted. No JVD or BLE edema. No carotid bruits noted. Pulmonary/Chest: Normal effort and positive vesicular breath sounds. No respiratory distress. No wheezes, rales or ronchi noted.  Abdomen: Soft and nontender. Normal bowel sounds.  Musculoskeletal: No difficulty with gait.  Neurological: Alert and oriented.  Coordination normal.  Psychiatric: Mood and affect normal. Behavior is normal. Judgment and thought content normal.     BMET    Component Value Date/Time   NA 140 10/08/2022 0857   NA 145 (H) 04/09/2017 1431   K 4.5 10/08/2022 0857   CL 107 10/08/2022 0857   CO2 25 10/08/2022 0857   GLUCOSE 97 10/08/2022 0857   BUN 17 10/08/2022 0857   BUN 6 (L) 04/09/2017 1431   CREATININE 1.29 (H) 10/08/2022 0857   CALCIUM 10.0 10/08/2022 0857   GFRNONAA 45 (L) 09/04/2020 0941   GFRAA 52 (L) 09/04/2020 0941    Lipid Panel     Component Value Date/Time   CHOL 162 10/08/2022 0857   CHOL 253 (H) 08/08/2015 1323   TRIG 80 10/08/2022 0857   HDL 57 10/08/2022 0857   HDL 58 08/08/2015 1323   CHOLHDL 2.8 10/08/2022 0857   VLDL 27 03/03/2017 1058   LDLCALC 88 10/08/2022 0857    CBC    Component Value Date/Time   WBC 6.5 10/08/2022 0857   RBC 4.32 10/08/2022 0857   HGB 13.2 10/08/2022 0857   HGB 11.6 04/09/2017 1431   HCT 39.6 10/08/2022 0857   HCT 34.5 04/09/2017 1431   PLT 359 10/08/2022 0857   PLT 376 04/09/2017 1431   MCV 91.7 10/08/2022 0857   MCV 92 04/09/2017 1431  MCH 30.6 10/08/2022 0857   MCHC 33.3 10/08/2022 0857   RDW 12.1 10/08/2022 0857   RDW 13.7 04/09/2017 1431   LYMPHSABS 1,699 09/04/2020 0941   LYMPHSABS 1.6 04/09/2017 1431   MONOABS 385 03/03/2017 1058   EOSABS 158 09/04/2020 0941   EOSABS 0.2 04/09/2017 1431    BASOSABS 79 09/04/2020 0941   BASOSABS 0.0 04/09/2017 1431    Hgb A1C Lab Results  Component Value Date   HGBA1C 5.7 (H) 10/08/2022           Assessment & Plan:     RTC in 6 months for your annual exam Nicki Reaper, NP

## 2023-04-15 NOTE — Patient Instructions (Signed)

## 2023-04-15 NOTE — Assessment & Plan Note (Signed)
C-Met and lipid profile today Encouraged her to consume a low-fat diet Continue atorvastatin, refilled today 

## 2023-04-15 NOTE — Assessment & Plan Note (Signed)
Encouraged regular stretching Continue Lyrica, turmeric, Voltaren gel, Tylenol and cyclobenzaprine

## 2023-04-15 NOTE — Assessment & Plan Note (Signed)
Continue calcium and vitamin D Encouraged weightbearing exercise daily

## 2023-04-15 NOTE — Assessment & Plan Note (Signed)
Continue propranolol and Sinemet per neurology

## 2023-04-16 LAB — CBC
HCT: 36 % (ref 35.0–45.0)
Hemoglobin: 11.6 g/dL — ABNORMAL LOW (ref 11.7–15.5)
MCH: 30.2 pg (ref 27.0–33.0)
MCHC: 32.2 g/dL (ref 32.0–36.0)
MCV: 93.8 fL (ref 80.0–100.0)
MPV: 10.2 fL (ref 7.5–12.5)
Platelets: 437 10*3/uL — ABNORMAL HIGH (ref 140–400)
RBC: 3.84 10*6/uL (ref 3.80–5.10)
RDW: 12.8 % (ref 11.0–15.0)
WBC: 7.4 10*3/uL (ref 3.8–10.8)

## 2023-04-16 LAB — COMPLETE METABOLIC PANEL WITH GFR
AG Ratio: 1.4 (calc) (ref 1.0–2.5)
ALT: 11 U/L (ref 6–29)
AST: 12 U/L (ref 10–35)
Albumin: 3.9 g/dL (ref 3.6–5.1)
Alkaline phosphatase (APISO): 78 U/L (ref 37–153)
BUN/Creatinine Ratio: 11 (calc) (ref 6–22)
BUN: 14 mg/dL (ref 7–25)
CO2: 25 mmol/L (ref 20–32)
Calcium: 9.5 mg/dL (ref 8.6–10.4)
Chloride: 106 mmol/L (ref 98–110)
Creat: 1.22 mg/dL — ABNORMAL HIGH (ref 0.50–1.05)
Globulin: 2.8 g/dL (ref 1.9–3.7)
Glucose, Bld: 103 mg/dL — ABNORMAL HIGH (ref 65–99)
Potassium: 4.3 mmol/L (ref 3.5–5.3)
Sodium: 140 mmol/L (ref 135–146)
Total Bilirubin: 0.3 mg/dL (ref 0.2–1.2)
Total Protein: 6.7 g/dL (ref 6.1–8.1)
eGFR: 49 mL/min/{1.73_m2} — ABNORMAL LOW (ref >=60)

## 2023-04-16 LAB — HEMOGLOBIN A1C
Hgb A1c MFr Bld: 5.8 %{Hb} — ABNORMAL HIGH (ref <5.7)
Mean Plasma Glucose: 120 mg/dL
eAG (mmol/L): 6.6 mmol/L

## 2023-04-16 LAB — LIPID PANEL
Cholesterol: 135 mg/dL (ref <200)
HDL: 49 mg/dL — ABNORMAL LOW (ref >=50)
LDL Cholesterol (Calc): 69 mg/dL
Non-HDL Cholesterol (Calc): 86 mg/dL (ref <130)
Total CHOL/HDL Ratio: 2.8 (calc) (ref <5.0)
Triglycerides: 88 mg/dL (ref <150)

## 2023-05-11 ENCOUNTER — Telehealth: Payer: Self-pay

## 2023-05-11 NOTE — Telephone Encounter (Signed)
Called patient back and she stated that she needed her procedure date to be changed. She decided to have it done on 06/11/2023. I then called the endoscopy unit and spoke to [place] and let her know about the date change. I will also notify Misty Stanley.

## 2023-05-11 NOTE — Telephone Encounter (Signed)
Pt requesting call back to  reschedule  procedure

## 2023-06-11 ENCOUNTER — Encounter
Admission: RE | Disposition: A | Discharge status: Home / Self Care | Payer: Self-pay | Source: Home / Self Care | Attending: Gastroenterology

## 2023-06-11 ENCOUNTER — Ambulatory Visit
Admission: RE | Admit: 2023-06-11 | Discharge: 2023-06-11 | Disposition: A | Discharge status: Home / Self Care | Payer: Medicare PPO | Attending: Gastroenterology | Admitting: Gastroenterology

## 2023-06-11 ENCOUNTER — Ambulatory Visit: Payer: Medicare PPO | Admitting: Anesthesiology

## 2023-06-11 ENCOUNTER — Encounter: Payer: Self-pay | Admitting: Gastroenterology

## 2023-06-11 DIAGNOSIS — K219 Gastro-esophageal reflux disease without esophagitis: Secondary | ICD-10-CM | POA: Insufficient documentation

## 2023-06-11 DIAGNOSIS — K5730 Diverticulosis of large intestine without perforation or abscess without bleeding: Secondary | ICD-10-CM | POA: Insufficient documentation

## 2023-06-11 DIAGNOSIS — Z87891 Personal history of nicotine dependence: Secondary | ICD-10-CM | POA: Diagnosis not present

## 2023-06-11 DIAGNOSIS — E669 Obesity, unspecified: Secondary | ICD-10-CM | POA: Insufficient documentation

## 2023-06-11 DIAGNOSIS — D126 Benign neoplasm of colon, unspecified: Secondary | ICD-10-CM

## 2023-06-11 DIAGNOSIS — K635 Polyp of colon: Secondary | ICD-10-CM

## 2023-06-11 DIAGNOSIS — N183 Chronic kidney disease, stage 3 unspecified: Secondary | ICD-10-CM | POA: Diagnosis not present

## 2023-06-11 DIAGNOSIS — I129 Hypertensive chronic kidney disease with stage 1 through stage 4 chronic kidney disease, or unspecified chronic kidney disease: Secondary | ICD-10-CM | POA: Insufficient documentation

## 2023-06-11 DIAGNOSIS — Z6839 Body mass index (BMI) 39.0-39.9, adult: Secondary | ICD-10-CM | POA: Diagnosis not present

## 2023-06-11 DIAGNOSIS — D122 Benign neoplasm of ascending colon: Secondary | ICD-10-CM | POA: Insufficient documentation

## 2023-06-11 DIAGNOSIS — Z1211 Encounter for screening for malignant neoplasm of colon: Secondary | ICD-10-CM

## 2023-06-11 DIAGNOSIS — Z800 Family history of malignant neoplasm of digestive organs: Secondary | ICD-10-CM | POA: Diagnosis not present

## 2023-06-11 HISTORY — PX: COLONOSCOPY WITH PROPOFOL: SHX5780

## 2023-06-11 HISTORY — PX: POLYPECTOMY: SHX5525

## 2023-06-11 SURGERY — COLONOSCOPY WITH PROPOFOL
Anesthesia: General

## 2023-06-11 MED ORDER — PROPOFOL 10 MG/ML IV BOLUS
INTRAVENOUS | Status: AC
  Filled 2023-06-11: qty 20

## 2023-06-11 MED ORDER — LIDOCAINE HCL (CARDIAC) PF 100 MG/5ML IV SOSY
PREFILLED_SYRINGE | INTRAVENOUS | Status: DC | PRN
  Administered 2023-06-11: 50 mg via INTRAVENOUS

## 2023-06-11 MED ORDER — SODIUM CHLORIDE 0.9 % IV SOLN: INTRAVENOUS | Status: DC

## 2023-06-11 MED ORDER — PROPOFOL 10 MG/ML IV BOLUS
INTRAVENOUS | Status: DC | PRN
  Administered 2023-06-11: 110 ug/kg/min via INTRAVENOUS
  Administered 2023-06-11: 100 mg via INTRAVENOUS

## 2023-06-11 NOTE — Op Note (Signed)
[facility] Gastroenterology Patient Name: Elizabeth [name] Procedure Date: 06/11/2023 9:44 AM MRN: [id number] Account #: [id number] Date of Birth: 1956/04/03 Admit Type: Outpatient Age: 67 Room: [facility] ENDO ROOM 2 Gender: Female Note Status: Finalized Instrument Name: Prentice Docker [id number] Procedure:             Colonoscopy Indications:           Screening for colorectal malignant neoplasm Providers:             Wyline Mood MD, MD Referring MD:          Lorre Munroe (Referring MD) Medicines:             Monitored Anesthesia Care Complications:         No immediate complications. Procedure:             Pre-Anesthesia Assessment:                        - Prior to the procedure, a History and Physical was                         performed, and patient medications, allergies and                         sensitivities were reviewed. The patient's tolerance                         of previous anesthesia was reviewed.                        - The risks and benefits of the procedure and the                         sedation options and risks were discussed with the                         patient. All questions were answered and informed                         consent was obtained.                        - ASA Grade Assessment: II - A patient with mild                         systemic disease.                        After obtaining informed consent, the colonoscope was                         passed under direct vision. Throughout the procedure,                         the patient's blood pressure, pulse, and oxygen                         saturations were monitored continuously. The                         Colonoscope was introduced  through the anus and                         advanced to the the cecum, identified by the                         appendiceal orifice. The colonoscopy was performed                         without difficulty. The patient tolerated the                          procedure well. The quality of the bowel preparation                         was fair. Anatomical landmarks were photographed. Findings:      The perianal and digital rectal examinations were normal.      Multiple medium-mouthed diverticula were found in the sigmoid colon.      A 15 mm polyp was found in the proximal ascending colon. The polyp was       flat. The polyp was removed with a piecemeal technique using a cold       snare. Resection and retrieval were complete.      The exam was otherwise without abnormality on direct and retroflexion       views. Impression:            - Preparation of the colon was fair.                        - Diverticulosis in the sigmoid colon.                        - One 15 mm polyp in the proximal ascending colon,                         removed piecemeal using a cold snare. Resected and                         retrieved.                        - The examination was otherwise normal on direct and                         retroflexion views. Recommendation:        - Discharge patient to home (with escort).                        - Resume previous diet.                        - Continue present medications.                        - Await pathology results.                        - Repeat colonoscopy in 6 months because the bowel  preparation was suboptimal. Procedure Code(s):     --- Professional ---                        [phone], Colonoscopy, flexible; with removal of                         tumor(s), polyp(s), or other lesion(s) by snare                         technique Diagnosis Code(s):     --- Professional ---                        Z12.11, Encounter for screening for malignant neoplasm                         of colon                        D12.2, Benign neoplasm of ascending colon                        K57.30, Diverticulosis of large intestine without                         perforation or abscess without  bleeding CPT copyright 2022 American Medical Association. All rights reserved. The codes documented in this report are preliminary and upon coder review may  be revised to meet current compliance requirements. Wyline Mood, MD Wyline Mood MD, MD 06/11/2023 10:19:03 AM This report has been signed electronically. Number of Addenda: 0 Note Initiated On: 06/11/2023 9:44 AM Scope Withdrawal Time: 0 hours 12 minutes 27 seconds  Total Procedure Duration: 0 hours 21 minutes 40 seconds  Estimated Blood Loss:  Estimated blood loss: none.      [facility]

## 2023-06-11 NOTE — Anesthesia Postprocedure Evaluation (Signed)
Anesthesia Post Note  Patient: Elizabeth [name]  Procedure(s) Performed: COLONOSCOPY WITH PROPOFOL  Patient location during evaluation: Endoscopy Anesthesia Type: General Level of consciousness: awake and alert Pain management: pain level controlled Vital Signs Assessment: post-procedure vital signs reviewed and stable Respiratory status: spontaneous breathing, nonlabored ventilation, respiratory function stable and patient connected to nasal cannula oxygen Cardiovascular status: blood pressure returned to baseline and stable Postop Assessment: no apparent nausea or vomiting Anesthetic complications: no   No notable events documented.   Last Vitals:  Vitals:   06/11/23 1029 06/11/23 1039  BP: (!) 104/48 139/84  Pulse:  64  Resp:    Temp:    SpO2:  99%    Last Pain:  Vitals:   06/11/23 1039  TempSrc:   PainSc: 0-No pain                 Corinda Gubler

## 2023-06-11 NOTE — Anesthesia Preprocedure Evaluation (Signed)
Anesthesia Evaluation  Patient identified by MRN, date of birth, ID band Patient awake    Reviewed: Allergy & Precautions, NPO status , Patient's Chart, lab work & pertinent test results  History of Anesthesia Complications Negative for: history of anesthetic complications  Airway Mallampati: II  TM Distance: >3 FB Neck ROM: Full    Dental no notable dental hx. (+) Teeth Intact   Pulmonary neg pulmonary ROS, neg sleep apnea, neg COPD, Patient abstained from smoking.Not current smoker, former smoker   Pulmonary exam normal breath sounds clear to auscultation       Cardiovascular Exercise Tolerance: Good METShypertension, Pt. on medications (-) CAD and (-) Past MI (-) dysrhythmias  Rhythm:Regular Rate:Normal - Systolic murmurs    Neuro/Psych  PSYCHIATRIC DISORDERS Anxiety Depression    negative neurological ROS     GI/Hepatic ,GERD  ,,(+)     (-) substance abuse    Endo/Other  neg diabetes    Renal/GU CRFRenal disease     Musculoskeletal   Abdominal  (+) + obese  Peds  Hematology   Anesthesia Other Findings Past Medical History: 08/04/1998: Allergy No date: Anxiety 08/04/2008: Arthritis 03/04/2021: CKD (chronic kidney disease) stage 3, GFR 30-59 ml/min (HCC) No date: Depression No date: GERD (gastroesophageal reflux disease) 11/02/1997: Hypertension No date: Insomnia No date: Mixed hyperlipidemia No date: Obesity  Reproductive/Obstetrics                              Anesthesia Physical Anesthesia Plan  ASA: 2  Anesthesia Plan: General   Post-op Pain Management: Minimal or no pain anticipated   Induction: Intravenous  PONV Risk Score and Plan: 3 and Propofol infusion, TIVA and Ondansetron  Airway Management Planned: Nasal Cannula  Additional Equipment: None  Intra-op Plan:   Post-operative Plan:   Informed Consent: I have reviewed the patients History and Physical,  chart, labs and discussed the procedure including the risks, benefits and alternatives for the proposed anesthesia with the patient or authorized representative who has indicated his/her understanding and acceptance.     Dental advisory given  Plan Discussed with: CRNA and Surgeon  Anesthesia Plan Comments: (Discussed risks of anesthesia with patient, including possibility of difficulty with spontaneous ventilation under anesthesia necessitating airway intervention, PONV, and rare risks such as cardiac or respiratory or neurological events, and allergic reactions. Discussed the role of CRNA in patient's perioperative care. Patient understands.)         Anesthesia Quick Evaluation

## 2023-06-11 NOTE — Transfer of Care (Signed)
Immediate Anesthesia Transfer of Care Note  Patient: Elizabeth [name]  Procedure(s) Performed: COLONOSCOPY WITH PROPOFOL  Patient Location: PACU and Endoscopy Unit  Anesthesia Type:General  Level of Consciousness: drowsy and patient cooperative  Airway & Oxygen Therapy: Patient Spontanous Breathing  Post-op Assessment: Report given to RN and Post -op Vital signs reviewed and stable  Post vital signs: Reviewed and stable  Last Vitals:  Vitals Value Taken Time  BP 97/64 06/11/23 1021  Temp 36.1 C 06/11/23 1019  Pulse 59 06/11/23 1023  Resp 13 06/11/23 1023  SpO2 97 % 06/11/23 1023  Vitals shown include unfiled device data.  Last Pain:  Vitals:   06/11/23 1019  TempSrc: Temporal  PainSc: Asleep         Complications: No notable events documented.

## 2023-06-11 NOTE — H&P (Signed)
Elizabeth Mood, MD [address], [address], [place], [place], [address] [address], [address], [place], [place], [address] Phone: [phone]  Fax: [phone]  Primary Care Physician:  Lorre Munroe, NP   Pre-Procedure History & Physical: HPI:  Elizabeth [name] is a 67 y.o. female is here for an colonoscopy.   Past Medical History:  Diagnosis Date   Allergy 08/04/1998   Anxiety    Arthritis 08/04/2008   CKD (chronic kidney disease) stage 3, GFR 30-59 ml/min (HCC) 03/04/2021   Depression    GERD (gastroesophageal reflux disease)    Hypertension 11/02/1997   Insomnia    Mixed hyperlipidemia    Obesity     Past Surgical History:  Procedure Laterality Date   CHOLECYSTECTOMY  2004   repeat 2014   COLONOSCOPY  08/05/2011    Prior to Admission medications   Medication Sig Start Date End Date Taking? Authorizing Provider  ARIPiprazole (ABILIFY) 5 MG tablet Take 1 tablet (5 mg total) by mouth daily. 04/15/23  Yes Lorre Munroe, NP  aspirin 81 MG tablet Take 81 mg by mouth daily.   Yes [provider]  atorvastatin (LIPITOR) 10 MG tablet TAKE 1 TABLET EVERY DAY 03/26/23  Yes [name], Salvadore Oxford, NP  carbidopa-levodopa (SINEMET IR) 25-100 MG tablet Take 1 tablet by mouth 3 (three) times daily.   Yes [provider]  cetirizine (ZYRTEC) 10 MG tablet Take 10 mg by mouth daily.   Yes [provider]  citalopram (CELEXA) 40 MG tablet Take 1 tablet (40 mg total) by mouth daily. 04/15/23  Yes [name], Salvadore Oxford, NP  fluticasone (FLONASE) 50 MCG/ACT nasal spray USE 1 SPRAY IN EACH NOSTRIL EVERY DAY 03/09/23  Yes [name], Salvadore Oxford, NP  losartan (COZAAR) 50 MG tablet Take 1 tablet (50 mg total) by mouth daily. 04/15/23  Yes Lorre Munroe, NP  Multiple Vitamin (MULTIVITAMIN) tablet Take 1 tablet by mouth daily.   Yes [provider]  Omega-3 Fatty Acids (FISH OIL) 1000 MG CAPS Take by mouth.   Yes [provider]  propranolol ER (INDERAL LA) 120 MG  24 hr capsule Take 1 capsule (120 mg total) by mouth daily. 04/15/23  Yes Lorre Munroe, NP  acetaminophen (TYLENOL) 500 MG tablet Take 1,000 mg by mouth daily as needed.    [provider]  Blood Pressure Monitoring (SM WRIST CUFF BP MONITOR) MISC 1 Units by Does not apply route daily. 03/03/17   Galen Manila, NP  chlorpheniramine (CHLOR-TRIMETON) 4 MG tablet Take 4 mg by mouth 2 (two) times daily as needed for allergies.    [provider]  cyclobenzaprine (FLEXERIL) 5 MG tablet TAKE 1 TABLET THREE TIMES DAILY AS NEEDED FOR MUSCLE SPASM(S) 04/01/22   Lorre Munroe, NP  pantoprazole (PROTONIX) 20 MG tablet TAKE 1 TABLET EVERY DAY 02/26/23   Lorre Munroe, NP  pregabalin (LYRICA) 25 MG capsule Take 25 mg by mouth 2 (two) times daily. 01/26/23   [provider]    Allergies as of 03/18/2023 - Review Complete 02/26/2023  Allergen Reaction Noted   Ace inhibitors Cough 01/25/2018    Family History  Problem Relation Age of Onset   Breast cancer Maternal Aunt 53   Alzheimer's disease Mother    Stroke Father    Hypertension Father    Multiple sclerosis Sister    Hypertension Sister    Early death Sister    Colon cancer Sister    Liver  cancer Sister    Breast cancer Cousin        mat cousin   Parkinson's disease Paternal Uncle    Early death Sister    Hypertension Sister     Social History   Socioeconomic History   Marital status: Widowed    Spouse name: Not on file   Number of children: 2   Years of education: Not on file   Highest education level: 12th grade  Occupational History   Not on file  Tobacco Use   Smoking status: Former    Current packs/day: 0.00    Average packs/day: 0.3 packs/day for 40.0 years (10.0 ttl pk-yrs)    Types: Cigarettes    Start date: 07/28/1982    Quit date: 07/28/2022    Years since quitting: 0.8   Smokeless tobacco: Never   Tobacco comments:    pt smoking 8 cigs daily  Vaping Use   Vaping status: Every  Day  Substance and Sexual Activity   Alcohol use: Yes    Comment: about once a month    Drug use: No   Sexual activity: Not Currently    Birth control/protection: Post-menopausal  Other Topics Concern   Not on file  Social History Narrative   Not on file   Social Determinants of Health   Financial Resource Strain: Low Risk  (04/14/2023)   Overall Financial Resource Strain (CARDIA)    Difficulty of Paying Living Expenses: Not hard at all  Food Insecurity: No Food Insecurity (04/14/2023)   Hunger Vital Sign    Worried About Running Out of Food in the Last Year: Never true    Ran Out of Food in the Last Year: Never true  Transportation Needs: No Transportation Needs (04/14/2023)   PRAPARE - Administrator, Civil Service (Medical): No    Lack of Transportation (Non-Medical): No  Physical Activity: Inactive (04/14/2023)   Exercise Vital Sign    Days of Exercise per Week: 0 days    Minutes of Exercise per Session: 0 min  Stress: No Stress Concern Present (04/14/2023)   [employer] of Occupational Health - Occupational Stress Questionnaire    Feeling of Stress : Not at all  Social Connections: Moderately Isolated (04/14/2023)   Social Connection and Isolation Panel [NHANES]    Frequency of Communication with Friends and Family: More than three times a week    Frequency of Social Gatherings with Friends and Family: Twice a week    Attends Religious Services: More than 4 times per year    Active Member of [employer] or Organizations: No    Attends Banker Meetings: Never    Marital Status: Widowed  Intimate Partner Violence: Not At Risk (09/26/2022)   Humiliation, Afraid, Rape, and Kick questionnaire    Fear of Current or Ex-Partner: No    Emotionally Abused: No    Physically Abused: No    Sexually Abused: No    Review of Systems: See HPI, otherwise negative ROS  Physical Exam: BP 131/76   Pulse 66   Temp 97.8 F (36.6 C) (Temporal)   Resp 16   Ht 4'  11" (1.499 m)   Wt 88.5 kg   SpO2 98%   BMI 39.43 kg/m  General:   Alert,  pleasant and cooperative in NAD Head:  Normocephalic and atraumatic. Neck:  Supple; no masses or thyromegaly. Lungs:  Clear throughout to auscultation, normal respiratory effort.    Heart:  +S1, +S2, Regular rate and rhythm, No  edema. Abdomen:  Soft, nontender and nondistended. Normal bowel sounds, without guarding, and without rebound.   Neurologic:  Alert and  oriented x4;  grossly normal neurologically.  Impression/Plan: Kimmerly Lora is here for an colonoscopy to be performed for Screening colonoscopy average risk   Risks, benefits, limitations, and alternatives regarding  colonoscopy have been reviewed with the patient.  Questions have been answered.  All parties agreeable.   Elizabeth Mood, MD  06/11/2023, 9:22 AM

## 2023-06-12 ENCOUNTER — Encounter: Payer: Self-pay | Admitting: Gastroenterology

## 2023-06-12 LAB — SURGICAL PATHOLOGY

## 2023-06-16 ENCOUNTER — Encounter: Payer: Self-pay | Admitting: Gastroenterology

## 2023-06-16 DIAGNOSIS — R7989 Other specified abnormal findings of blood chemistry: Secondary | ICD-10-CM | POA: Diagnosis not present

## 2023-06-16 DIAGNOSIS — Z79899 Other long term (current) drug therapy: Secondary | ICD-10-CM | POA: Diagnosis not present

## 2023-06-16 DIAGNOSIS — G2119 Other drug induced secondary parkinsonism: Secondary | ICD-10-CM | POA: Diagnosis not present

## 2023-06-16 DIAGNOSIS — R400 Somnolence: Secondary | ICD-10-CM | POA: Diagnosis not present

## 2023-06-16 DIAGNOSIS — E569 Vitamin deficiency, unspecified: Secondary | ICD-10-CM | POA: Diagnosis not present

## 2023-06-16 DIAGNOSIS — R0683 Snoring: Secondary | ICD-10-CM | POA: Diagnosis not present

## 2023-07-01 ENCOUNTER — Other Ambulatory Visit: Payer: Self-pay | Admitting: *Deleted

## 2023-07-01 DIAGNOSIS — Z122 Encounter for screening for malignant neoplasm of respiratory organs: Secondary | ICD-10-CM

## 2023-07-01 DIAGNOSIS — Z87891 Personal history of nicotine dependence: Secondary | ICD-10-CM

## 2023-07-13 ENCOUNTER — Ambulatory Visit: Payer: Medicare PPO | Admitting: Adult Health

## 2023-07-13 ENCOUNTER — Encounter: Payer: Self-pay | Admitting: Adult Health

## 2023-07-13 DIAGNOSIS — Z87891 Personal history of nicotine dependence: Secondary | ICD-10-CM

## 2023-07-13 NOTE — Patient Instructions (Signed)

## 2023-07-13 NOTE — Progress Notes (Signed)
  Virtual Visit via Telephone Note  I connected with Elizabeth [name] , 07/13/23 8:57 AM by a telemedicine application and verified that I am speaking with the correct person using two identifiers.  Location: Patient: home Provider: home   I discussed the limitations of evaluation and management by telemedicine and the availability of in person appointments. The patient expressed understanding and agreed to proceed.   Shared Decision Making Visit Lung Cancer Screening Program [phone])   Eligibility: 67 y.o. Pack Years Smoking History Calculation 25 pack years (# packs/per year x # years smoked) Recent History of coughing up blood  no Unexplained weight loss? no ( >Than 15 pounds within the last 6 months ) Prior History Lung / other cancer no (Diagnosis within the last 5 years already requiring surveillance chest CT Scans). Smoking Status Former Smoker Former Smokers: Years since quit: 1 year  Quit Date: 2023  Visit Components: Discussion included one or more decision making aids. YES Discussion included risk/benefits of screening. YES Discussion included potential follow up diagnostic testing for abnormal scans. YES Discussion included meaning and risk of over diagnosis. YES Discussion included meaning and risk of False Positives. YES Discussion included meaning of total radiation exposure. YES  Counseling Included: Importance of adherence to annual lung cancer LDCT screening. YES Impact of comorbidities on ability to participate in the program. YES Ability and willingness to under diagnostic treatment. YES  Smoking Cessation Counseling: Former Smokers:  Discussed the importance of maintaining cigarette abstinence. no Diagnosis Code: Personal History of Nicotine Dependence. I95.188 Information about tobacco cessation classes and interventions provided to patient. Yes Patient provided with "ticket" for LDCT Scan. yes Written Order for Lung Cancer Screening with LDCT  placed in Epic. Yes (CT Chest Lung Cancer Screening Low Dose W/O CM) [id number]  Z12.2-Screening of respiratory organs Z87.891-Personal history of nicotine dependence   [name] 07/13/23

## 2023-07-14 ENCOUNTER — Ambulatory Visit
Admission: RE | Admit: 2023-07-14 | Discharge: 2023-07-14 | Disposition: A | Discharge status: Home / Self Care | Payer: Medicare PPO | Source: Ambulatory Visit | Attending: Acute Care | Admitting: Acute Care

## 2023-07-14 ENCOUNTER — Ambulatory Visit: Admission: RE | Admit: 2023-07-14 | Payer: Medicare PPO | Source: Home / Self Care

## 2023-07-14 DIAGNOSIS — Z122 Encounter for screening for malignant neoplasm of respiratory organs: Secondary | ICD-10-CM | POA: Insufficient documentation

## 2023-07-14 DIAGNOSIS — Z87891 Personal history of nicotine dependence: Secondary | ICD-10-CM | POA: Insufficient documentation

## 2023-07-15 ENCOUNTER — Ambulatory Visit: Admission: RE | Admit: 2023-07-15 | Payer: Medicare PPO | Source: Ambulatory Visit

## 2023-07-27 ENCOUNTER — Other Ambulatory Visit: Payer: Self-pay

## 2023-07-27 DIAGNOSIS — Z122 Encounter for screening for malignant neoplasm of respiratory organs: Secondary | ICD-10-CM

## 2023-07-27 DIAGNOSIS — Z87891 Personal history of nicotine dependence: Secondary | ICD-10-CM

## 2023-07-27 DIAGNOSIS — F17210 Nicotine dependence, cigarettes, uncomplicated: Secondary | ICD-10-CM

## 2023-09-08 ENCOUNTER — Encounter: Payer: Self-pay | Admitting: Internal Medicine

## 2023-09-08 ENCOUNTER — Telehealth: Payer: Medicare HMO | Admitting: Internal Medicine

## 2023-09-08 DIAGNOSIS — J029 Acute pharyngitis, unspecified: Secondary | ICD-10-CM | POA: Diagnosis not present

## 2023-09-08 DIAGNOSIS — R051 Acute cough: Secondary | ICD-10-CM

## 2023-09-08 DIAGNOSIS — Z20828 Contact with and (suspected) exposure to other viral communicable diseases: Secondary | ICD-10-CM

## 2023-09-08 MED ORDER — OSELTAMIVIR PHOSPHATE 75 MG PO CAPS: 75.0000 mg | ORAL_CAPSULE | Freq: Two times a day (BID) | ORAL | 0 refills | Status: DC

## 2023-09-08 MED ORDER — BENZONATATE 100 MG PO CAPS
100.0000 mg | ORAL_CAPSULE | Freq: Three times a day (TID) | ORAL | 0 refills | Status: DC | PRN
Start: 2023-09-08 — End: 2023-10-09

## 2023-09-08 NOTE — Progress Notes (Signed)
 Virtual Visit via Video Note  I connected with Elizabeth [name] on 09/08/23 at  4:00 PM EST by a video enabled telemedicine application and verified that I am speaking with the correct person using two identifiers.  Location: Patient: Home Provider: Office  Person's participating in this video call: Elizabeth Laura, NP-C and Elizabeth [name]   I discussed the limitations of evaluation and management by telemedicine and the availability of in person appointments. The patient expressed understanding and agreed to proceed.  History of Present Illness:   Discussed the use of AI scribe software for clinical note transcription with the patient, who gave verbal consent to proceed.   Elizabeth [name] is a 68 year old female with kidney disease who presents with a sore throat and cough. She is accompanied by her daughter, who recently had the flu.  She began experiencing a sore throat and cough yesterday. She is concerned about contracting the flu due to her daughter's recent illness, despite having received the flu shot. She has taken Tylenol and Benadryl for symptom relief, which have been effective.  She denies headache, ear pain, shortness of breath, nausea, vomiting, diarrhea, fever, chills, or body aches. She reports nasal congestion.      Past Medical History:  Diagnosis Date   Allergy 08/04/1998   Anxiety    Arthritis 08/04/2008   CKD (chronic kidney disease) stage 3, GFR 30-59 ml/min (HCC) 03/04/2021   Depression    GERD (gastroesophageal reflux disease)    Hypertension 11/02/1997   Insomnia    Mixed hyperlipidemia    Obesity     Current Outpatient Medications  Medication Sig Dispense Refill   acetaminophen (TYLENOL) 500 MG tablet Take 1,000 mg by mouth daily as needed.     ARIPiprazole  (ABILIFY ) 5 MG tablet Take 1 tablet (5 mg total) by mouth daily. 90 tablet 1   aspirin 81 MG tablet Take 81 mg by mouth daily.     atorvastatin  (LIPITOR) 10 MG tablet TAKE 1 TABLET EVERY DAY  90 tablet 1   Blood Pressure Monitoring (SM WRIST CUFF BP MONITOR) MISC 1 Units by Does not apply route daily. 1 each 0   carbidopa-levodopa (SINEMET IR) 25-100 MG tablet Take 1 tablet by mouth 3 (three) times daily.     cetirizine  (ZYRTEC ) 10 MG tablet Take 10 mg by mouth daily.     chlorpheniramine (CHLOR-TRIMETON) 4 MG tablet Take 4 mg by mouth 2 (two) times daily as needed for allergies.     citalopram  (CELEXA ) 40 MG tablet Take 1 tablet (40 mg total) by mouth daily. 90 tablet 1   cyclobenzaprine  (FLEXERIL ) 5 MG tablet TAKE 1 TABLET THREE TIMES DAILY AS NEEDED FOR MUSCLE SPASM(S) 90 tablet 1   fluticasone  (FLONASE ) 50 MCG/ACT nasal spray USE 1 SPRAY IN EACH NOSTRIL EVERY DAY 16 g 2   losartan  (COZAAR ) 50 MG tablet Take 1 tablet (50 mg total) by mouth daily. 90 tablet 1   Multiple Vitamin (MULTIVITAMIN) tablet Take 1 tablet by mouth daily.     Omega-3 Fatty Acids (FISH OIL ) 1000 MG CAPS Take by mouth.     pantoprazole  (PROTONIX ) 20 MG tablet TAKE 1 TABLET EVERY DAY 90 tablet 2   pregabalin (LYRICA) 25 MG capsule Take 25 mg by mouth 2 (two) times daily.     propranolol  ER (INDERAL  LA) 120 MG 24 hr capsule Take 1 capsule (120 mg total) by mouth daily. 90 capsule 1   No current facility-administered medications for this visit.  Allergies  Allergen Reactions   Ace Inhibitors Cough    Family History  Problem Relation Age of Onset   Breast cancer Maternal Aunt 62   Alzheimer's disease Mother    Stroke Father    Hypertension Father    Multiple sclerosis Sister    Hypertension Sister    Early death Sister    Colon cancer Sister    Liver cancer Sister    Breast cancer Cousin        mat cousin   Parkinson's disease Paternal Uncle    Early death Sister    Hypertension Sister     Social History   Socioeconomic History   Marital status: Widowed    Spouse name: Not on file   Number of children: 2   Years of education: Not on file   Highest education level: 12th grade   Occupational History   Not on file  Tobacco Use   Smoking status: Former    Current packs/day: 0.00    Average packs/day: 0.3 packs/day for 40.0 years (10.0 ttl pk-yrs)    Types: Cigarettes    Start date: 07/28/1982    Quit date: 07/28/2022    Years since quitting: 1.1   Smokeless tobacco: Never   Tobacco comments:    pt smoking 8 cigs daily  Vaping Use   Vaping status: Every Day  Substance and Sexual Activity   Alcohol use: Yes    Comment: about once a month    Drug use: No   Sexual activity: Not Currently    Birth control/protection: Post-menopausal  Other Topics Concern   Not on file  Social History Narrative   Not on file   Social Drivers of Health   Financial Resource Strain: Low Risk  (04/14/2023)   Overall Financial Resource Strain (CARDIA)    Difficulty of Paying Living Expenses: Not hard at all  Food Insecurity: No Food Insecurity (04/14/2023)   Hunger Vital Sign    Worried About Running Out of Food in the Last Year: Never true    Ran Out of Food in the Last Year: Never true  Transportation Needs: No Transportation Needs (04/14/2023)   PRAPARE - Administrator, Civil Service (Medical): No    Lack of Transportation (Non-Medical): No  Physical Activity: Inactive (04/14/2023)   Exercise Vital Sign    Days of Exercise per Week: 0 days    Minutes of Exercise per Session: 0 min  Stress: No Stress Concern Present (04/14/2023)   [employer] of Occupational Health - Occupational Stress Questionnaire    Feeling of Stress : Not at all  Social Connections: Moderately Isolated (04/14/2023)   Social Connection and Isolation Panel [NHANES]    Frequency of Communication with Friends and Family: More than three times a week    Frequency of Social Gatherings with Friends and Family: Twice a week    Attends Religious Services: More than 4 times per year    Active Member of [employer] or Organizations: No    Attends Banker Meetings: Never    Marital  Status: Widowed  Intimate Partner Violence: Not At Risk (09/26/2022)   Humiliation, Afraid, Rape, and Kick questionnaire    Fear of Current or Ex-Partner: No    Emotionally Abused: No    Physically Abused: No    Sexually Abused: No     Constitutional: Denies fever, malaise, fatigue, headache or abrupt weight changes.  HEENT: Pt reports sore throat. Denies eye pain, eye redness, ear pain, ringing  in the ears, wax buildup, runny nose, nasal congestion, bloody nose. Respiratory: Pt reports cough. Denies difficulty breathing, shortness of breath, or sputum production.   Cardiovascular: Denies chest pain, chest tightness, palpitations or swelling in the hands or feet.  Gastrointestinal: Denies abdominal pain, bloating, constipation, diarrhea or blood in the stool.   No other specific complaints in a complete review of systems (except as listed in HPI above).  Observations/Objective:   Wt Readings from Last 3 Encounters:  07/14/23 195 lb (88.5 kg)  06/11/23 195 lb 3.2 oz (88.5 kg)  04/15/23 199 lb (90.3 kg)    General: Appears her stated age, obese, in NAD. HEENT: Head: normal shape and size; Nose: no congestion noted; Throat/Mouth: hoarseness noted.  Pulmonary/Chest: Normal effort. No respiratory distress.  Neurological: Alert and oriented.   BMET    Component Value Date/Time   NA 140 04/15/2023 0927   NA 145 (H) 04/09/2017 1431   K 4.3 04/15/2023 0927   CL 106 04/15/2023 0927   CO2 25 04/15/2023 0927   GLUCOSE 103 (H) 04/15/2023 0927   BUN 14 04/15/2023 0927   BUN 6 (L) 04/09/2017 1431   CREATININE 1.22 (H) 04/15/2023 0927   CALCIUM  9.5 04/15/2023 0927   GFRNONAA 45 (L) 09/04/2020 0941   GFRAA 52 (L) 09/04/2020 0941    Lipid Panel     Component Value Date/Time   CHOL 135 04/15/2023 0927   CHOL 253 (H) 08/08/2015 1323   TRIG 88 04/15/2023 0927   HDL 49 (L) 04/15/2023 0927   HDL 58 08/08/2015 1323   CHOLHDL 2.8 04/15/2023 0927   VLDL 27 03/03/2017 1058   LDLCALC 69  04/15/2023 0927    CBC    Component Value Date/Time   WBC 7.4 04/15/2023 0927   RBC 3.84 04/15/2023 0927   HGB 11.6 (L) 04/15/2023 0927   HGB 11.6 04/09/2017 1431   HCT 36.0 04/15/2023 0927   HCT 34.5 04/09/2017 1431   PLT 437 (H) 04/15/2023 0927   PLT 376 04/09/2017 1431   MCV 93.8 04/15/2023 0927   MCV 92 04/09/2017 1431   MCH 30.2 04/15/2023 0927   MCHC 32.2 04/15/2023 0927   RDW 12.8 04/15/2023 0927   RDW 13.7 04/09/2017 1431   LYMPHSABS 1,699 09/04/2020 0941   LYMPHSABS 1.6 04/09/2017 1431   MONOABS 385 03/03/2017 1058   EOSABS 158 09/04/2020 0941   EOSABS 0.2 04/09/2017 1431   BASOSABS 79 09/04/2020 0941   BASOSABS 0.0 04/09/2017 1431    Hgb A1C Lab Results  Component Value Date   HGBA1C 5.8 (H) 04/15/2023       Assessment and Plan:  Assessment and Plan    Influenza Exposure Recent close contact with confirmed influenza case. Current symptoms of sore throat, cough, and nasal congestion. No systemic symptoms such as fever, chills, or body aches. Received influenza vaccine. -Start Tamiflu  75mg  twice daily for 5 days. -Continue Tylenol 1000mg  every 8 hours as needed for symptom relief. -Add Benzonatate  100mg  every 8 hours as needed for cough. -If symptoms worsen or do not improve by 09/14/2023, patient to notify the office.  Chronic Kidney Disease Noted in conversation, no acute issues discussed. -No changes to current management.       RTC in 1 month for your annual exam  Follow Up Instructions:    I discussed the assessment and treatment plan with the patient. The patient was provided an opportunity to ask questions and all were answered. The patient agreed with the plan and demonstrated  an understanding of the instructions.   The patient was advised to call back or seek an in-person evaluation if the symptoms worsen or if the condition fails to improve as anticipated.   Elizabeth Laura, NP

## 2023-09-08 NOTE — Patient Instructions (Signed)

## 2023-09-09 ENCOUNTER — Encounter: Payer: Self-pay | Admitting: Internal Medicine

## 2023-09-30 ENCOUNTER — Telehealth: Payer: Medicare HMO | Admitting: Family

## 2023-09-30 DIAGNOSIS — J019 Acute sinusitis, unspecified: Secondary | ICD-10-CM

## 2023-09-30 MED ORDER — AMOXICILLIN-POT CLAVULANATE 875-125 MG PO TABS: 1.0000 | ORAL_TABLET | Freq: Two times a day (BID) | ORAL | 0 refills | Status: DC

## 2023-09-30 NOTE — Progress Notes (Signed)

## 2023-10-02 ENCOUNTER — Ambulatory Visit: Payer: Medicare HMO

## 2023-10-02 DIAGNOSIS — Z0000 Encounter for general adult medical examination without abnormal findings: Secondary | ICD-10-CM | POA: Diagnosis not present

## 2023-10-02 NOTE — Patient Instructions (Addendum)
 Ms. Elizabeth [name] , Thank you for taking time to come for your Medicare Wellness Visit. I appreciate your ongoing commitment to your health goals. Please review the following plan we discussed and let me know if I can assist you in the future.   Referrals/Orders/Follow-Ups/Clinician Recommendations: NONE  This is a list of the screening recommended for you and due dates:  Health Maintenance  Topic Date Due   Zoster (Shingles) Vaccine (1 of 2) Never done   DTaP/Tdap/Td vaccine (2 - Td or Tdap) 02/26/2023   Mammogram  03/22/2024   Medicare Annual Wellness Visit  10/01/2024   Colon Cancer Screening  06/10/2033   Pneumonia Vaccine  Completed   Flu Shot  Completed   DEXA scan (bone density measurement)  Completed   Hepatitis C Screening  Completed   HPV Vaccine  Aged Out   COVID-19 Vaccine  Discontinued    Advanced directives: (ACP Link)Information on Advanced Care Planning can be found at [facility] Advance Health Care Directives Advance Health Care Directives ([email or web address]   Next Medicare Annual Wellness Visit scheduled for next year: Yes   10/07/24 @ 8:50 AM BY VIDEO

## 2023-10-02 NOTE — Progress Notes (Signed)
 Subjective:   Elizabeth [name] is a 68 y.o. who presents for a Medicare Wellness preventive visit.  Visit Complete: Virtual I connected with  [name] on 10/02/23 by a video and audio enabled telemedicine application and verified that I am speaking with the correct person using two identifiers.  Patient Location: Home  Provider Location: Office/Clinic  I discussed the limitations of evaluation and management by telemedicine. The patient expressed understanding and agreed to proceed.  Vital Signs: Because this visit was a virtual/telehealth visit, some criteria may be missing or patient reported. Any vitals not documented were not able to be obtained and vitals that have been documented are patient reported.   AWV Questionnaire: No: Patient Medicare AWV questionnaire was not completed prior to this visit.  Cardiac Risk Factors include: advanced age (>86men, >59 women);dyslipidemia;hypertension;obesity (BMI >30kg/m2);smoking/ tobacco exposure;sedentary lifestyle     Objective:    There were no vitals filed for this visit. There is no height or weight on file to calculate BMI.     10/02/2023    9:22 AM 06/11/2023    9:15 AM 09/26/2022   11:18 AM 04/05/2019    3:44 PM 03/23/2018    2:27 PM 02/17/2017    1:32 PM 05/30/2015    1:13 PM  Advanced Directives  Does Patient Have a Medical Advance Directive? No No No No No No Yes  Does patient want to make changes to medical advance directive?       Yes - information given  Would patient like information on creating a medical advance directive? No - Patient declined  No - Patient declined  Yes (MAU/Ambulatory/Procedural Areas - Information given) Yes (MAU/Ambulatory/Procedural Areas - Information given)     Current Medications (verified) Outpatient Encounter Medications as of 10/02/2023  Medication Sig   acetaminophen (TYLENOL) 500 MG tablet Take 1,000 mg by mouth daily as needed.   amoxicillin-clavulanate (AUGMENTIN)  875-125 MG tablet Take 1 tablet by mouth 2 (two) times daily.   ARIPiprazole (ABILIFY) 5 MG tablet Take 1 tablet (5 mg total) by mouth daily.   aspirin 81 MG tablet Take 81 mg by mouth daily.   atorvastatin (LIPITOR) 10 MG tablet TAKE 1 TABLET EVERY DAY   benzonatate (TESSALON) 100 MG capsule Take 1 capsule (100 mg total) by mouth 3 (three) times daily as needed for cough.   Blood Pressure Monitoring (SM WRIST CUFF BP MONITOR) MISC 1 Units by Does not apply route daily.   carbidopa-levodopa (SINEMET IR) 25-100 MG tablet Take 1 tablet by mouth 3 (three) times daily.   chlorpheniramine (CHLOR-TRIMETON) 4 MG tablet Take 4 mg by mouth 2 (two) times daily as needed for allergies.   citalopram (CELEXA) 40 MG tablet Take 1 tablet (40 mg total) by mouth daily.   cyclobenzaprine (FLEXERIL) 5 MG tablet TAKE 1 TABLET THREE TIMES DAILY AS NEEDED FOR MUSCLE SPASM(S)   losartan (COZAAR) 50 MG tablet Take 1 tablet (50 mg total) by mouth daily.   Multiple Vitamin (MULTIVITAMIN) tablet Take 1 tablet by mouth daily.   Omega-3 Fatty Acids (FISH OIL) 1000 MG CAPS Take by mouth.   pantoprazole (PROTONIX) 20 MG tablet TAKE 1 TABLET EVERY DAY   pregabalin (LYRICA) 25 MG capsule Take 25 mg by mouth 2 (two) times daily.   propranolol ER (INDERAL LA) 120 MG 24 hr capsule Take 1 capsule (120 mg total) by mouth daily.   cetirizine (ZYRTEC) 10 MG tablet Take 10 mg by mouth daily.   fluticasone (FLONASE) 50  MCG/ACT nasal spray USE 1 SPRAY IN EACH NOSTRIL EVERY DAY   oseltamivir (TAMIFLU) 75 MG capsule Take 1 capsule (75 mg total) by mouth 2 (two) times daily. For 5 days (Patient not taking: Reported on 10/02/2023)   No facility-administered encounter medications on file as of 10/02/2023.    Allergies (verified) Ace inhibitors   History: Past Medical History:  Diagnosis Date   Allergy 08/04/1998   Anxiety    Arthritis 08/04/2008   CKD (chronic kidney disease) stage 3, GFR 30-59 ml/min (HCC) 03/04/2021   Depression     GERD (gastroesophageal reflux disease)    Hypertension 11/02/1997   Insomnia    Mixed hyperlipidemia    Obesity    Past Surgical History:  Procedure Laterality Date   CHOLECYSTECTOMY  2004   repeat 2014   COLONOSCOPY  08/05/2011   COLONOSCOPY WITH PROPOFOL N/A 06/11/2023   Procedure: COLONOSCOPY WITH PROPOFOL;  Surgeon: Wyline Mood, MD;  Location: [facility] ENDOSCOPY;  Service: Gastroenterology;  Laterality: N/A;   POLYPECTOMY  06/11/2023   Procedure: POLYPECTOMY;  Surgeon: Wyline Mood, MD;  Location: [facility] ENDOSCOPY;  Service: Gastroenterology;;   Family History  Problem Relation Age of Onset   Breast cancer Maternal Aunt 29   Alzheimer's disease Mother    Stroke Father    Hypertension Father    Multiple sclerosis Sister    Hypertension Sister    Early death Sister    Colon cancer Sister    Liver cancer Sister    Breast cancer Cousin        mat cousin   Parkinson's disease Paternal Uncle    Early death Sister    Hypertension Sister    Social History   Socioeconomic History   Marital status: Widowed    Spouse name: Not on file   Number of children: 2   Years of education: Not on file   Highest education level: 12th grade  Occupational History   Not on file  Tobacco Use   Smoking status: Former    Current packs/day: 0.00    Average packs/day: 0.3 packs/day for 40.0 years (10.0 ttl pk-yrs)    Types: Cigarettes    Start date: 07/28/1982    Quit date: 07/28/2022    Years since quitting: 1.1   Smokeless tobacco: Never   Tobacco comments:    pt smoking 8 cigs daily  Vaping Use   Vaping status: Every Day  Substance and Sexual Activity   Alcohol use: Yes    Comment: about once a month    Drug use: No   Sexual activity: Not Currently    Birth control/protection: Post-menopausal  Other Topics Concern   Not on file  Social History Narrative   Not on file   Social Drivers of Health   Financial Resource Strain: Low Risk  (10/02/2023)   Overall Financial Resource Strain  (CARDIA)    Difficulty of Paying Living Expenses: Not hard at all  Food Insecurity: No Food Insecurity (10/02/2023)   Hunger Vital Sign    Worried About Running Out of Food in the Last Year: Never true    Ran Out of Food in the Last Year: Never true  Transportation Needs: No Transportation Needs (10/02/2023)   PRAPARE - Transportation    Lack of Transportation (Medical): No    Lack of Transportation (Non-Medical): No  Physical Activity: Insufficiently Active (10/02/2023)   Exercise Vital Sign    Days of Exercise per Week: 2 days    Minutes of Exercise per Session: 20  min  Stress: No Stress Concern Present (10/02/2023)   [employer] of Occupational Health - Occupational Stress Questionnaire    Feeling of Stress : Not at all  Social Connections: Moderately Isolated (10/02/2023)   Social Connection and Isolation Panel [NHANES]    Frequency of Communication with Friends and Family: More than three times a week    Frequency of Social Gatherings with Friends and Family: Twice a week    Attends Religious Services: More than 4 times per year    Active Member of [employer] or Organizations: No    Attends Banker Meetings: Never    Marital Status: Widowed    Tobacco Counseling Counseling given: Not Answered Tobacco comments: pt smoking 8 cigs daily    Clinical Intake:  Pre-visit preparation completed: Yes  Pain : No/denies pain     BMI - recorded: 40.2 Nutritional Status: BMI > 30  Obese Nutritional Risks: None Diabetes: No  How often do you need to have someone help you when you read instructions, pamphlets, or other written materials from your doctor or pharmacy?: 1 - Never  Interpreter Needed?: No  Information entered by :: Kennedy Bucker, LPN   Activities of Daily Living     10/02/2023    9:23 AM 09/30/2023    1:06 AM  In your present state of health, do you have any difficulty performing the following activities:  Hearing? 0 0  Vision? 0 0  Difficulty  concentrating or making decisions? 0 0  Walking or climbing stairs? 1 1  Comment KNEES   Dressing or bathing? 0 0  Doing errands, shopping? 0 0  Preparing Food and eating ? N N  Using the Toilet? N N  In the past six months, have you accidently leaked urine? Y Y  Do you have problems with loss of bowel control? N N  Managing your Medications? N N  Managing your Finances? N N  Housekeeping or managing your Housekeeping? N N    Patient Care Team: Lorre Munroe, NP as PCP - General (Internal Medicine) Pa, [facility]  Indicate any recent Medical Services you may have received from other than [facility] providers in the past year (date may be approximate).     Assessment:   This is a routine wellness examination for [place].  Hearing/Vision screen Hearing Screening - Comments:: NO AIDS Vision Screening - Comments:: WEARS CONTACTS- PATTY VISION   Goals Addressed             This Visit's Progress    DIET - INCREASE WATER INTAKE         Depression Screen     10/02/2023    9:20 AM 04/15/2023    9:14 AM 10/02/2022    2:13 PM 09/26/2022   11:16 AM 09/16/2021    2:25 PM 03/04/2021    1:30 PM 09/30/2019    1:54 PM  PHQ 2/9 Scores  PHQ - 2 Score 0 0 0 0 0 0 0  PHQ- 9 Score 0    0 0     Fall Risk     10/02/2023    9:23 AM 09/30/2023    1:06 AM 04/15/2023    9:14 AM 10/02/2022    2:14 PM 09/26/2022   11:18 AM  Fall Risk   Falls in the past year? 0 0 1 0 0  Number falls in past yr: 0 0 0  0  Injury with Fall? 0 0 1 0 0  Risk for fall  due to : No Fall Risks   No Fall Risks No Fall Risks  Follow up Falls prevention discussed;Falls evaluation completed    Falls prevention discussed;Falls evaluation completed    MEDICARE RISK AT HOME:  Medicare Risk at Home Any stairs in or around the home?: Yes If so, are there any without handrails?: No Home free of loose throw rugs in walkways, pet beds, electrical cords, etc?: Yes Adequate lighting in your home to reduce risk of  falls?: Yes Life alert?: No Use of a cane, walker or w/c?: No Grab bars in the bathroom?: No Shower chair or bench in shower?: No Elevated toilet seat or a handicapped toilet?: No  TIMED UP AND GO:  Was the test performed?  No  Cognitive Function: 6CIT completed    05/30/2015    1:26 PM  MMSE - Mini Mental State Exam  Orientation to time 5  Orientation to Place 5  Registration 3  Attention/ Calculation 5  Recall 3  Language- name 2 objects 2  Language- repeat 1  Language- follow 3 step command 3  Language- read & follow direction 1  Write a sentence 1  Copy design 1  Total score 30        10/02/2023    9:25 AM 09/26/2022   11:24 AM 09/16/2021    2:30 PM 03/23/2018    2:29 PM 02/17/2017    1:43 PM  6CIT Screen  What Year? 0 points 0 points 0 points 0 points 0 points  What month? 0 points 0 points 0 points 0 points 0 points  What time? 0 points 0 points 0 points 0 points 0 points  Count back from 20 0 points 0 points 0 points 0 points 0 points  Months in reverse 0 points 0 points 0 points 0 points 0 points  Repeat phrase 0 points 0 points 0 points 2 points 0 points  Total Score 0 points 0 points 0 points 2 points 0 points    Immunizations Immunization History  Administered Date(s) Administered   Fluad Quad(high Dose 65+) 09/27/2021, 07/16/2022   Fluad Trivalent(High Dose 65+) 04/15/2023   Influenza,inj,Quad PF,6+ Mos 05/30/2015, 04/29/2016, 05/05/2017, 05/26/2018, 05/06/2019, 08/27/2020   PNEUMOCOCCAL CONJUGATE-20 09/27/2021   Pneumococcal Polysaccharide-23 02/25/2013   Tdap 02/25/2013    Screening Tests Health Maintenance  Topic Date Due   Zoster Vaccines- Shingrix (1 of 2) Never done   DTaP/Tdap/Td (2 - Td or Tdap) 02/26/2023   MAMMOGRAM  03/22/2024   Medicare Annual Wellness (AWV)  10/01/2024   Colonoscopy  06/10/2033   Pneumonia Vaccine 78+ Years old  Completed   INFLUENZA VACCINE  Completed   DEXA SCAN  Completed   Hepatitis C Screening  Completed    HPV VACCINES  Aged Out   COVID-19 Vaccine  Discontinued    Health Maintenance  Health Maintenance Due  Topic Date Due   Zoster Vaccines- Shingrix (1 of 2) Never done   DTaP/Tdap/Td (2 - Td or Tdap) 02/26/2023   Health Maintenance Items Addressed: HAVING ANOTHER COLONOSCOPY SHORTLY D/T WASN'T CLEAN AT THE LAST ONE  Additional Screening:  Vision Screening: Recommended annual ophthalmology exams for early detection of glaucoma and other disorders of the eye.  Dental Screening: Recommended annual dental exams for proper oral hygiene  Community Resource Referral / Chronic Care Management: CRR required this visit?  No   CCM required this visit?  No     Plan:     I have personally reviewed and noted the following in  the patient's chart:   Medical and social history Use of alcohol, tobacco or illicit drugs  Current medications and supplements including opioid prescriptions. Patient is not currently taking opioid prescriptions. Functional ability and status Nutritional status Physical activity Advanced directives List of other physicians Hospitalizations, surgeries, and ER visits in previous 12 months Vitals Screenings to include cognitive, depression, and falls Referrals and appointments  In addition, I have reviewed and discussed with patient certain preventive protocols, quality metrics, and best practice recommendations. A written personalized care plan for preventive services as well as general preventive health recommendations were provided to patient.     Hal Hope, LPN   1/61/0960   After Visit Summary: (MyChart) Due to this being a telephonic visit, the after visit summary with patients personalized plan was offered to patient via MyChart   Notes: Nothing significant to report at this time.

## 2023-10-03 ENCOUNTER — Other Ambulatory Visit: Payer: Self-pay | Admitting: Internal Medicine

## 2023-10-03 DIAGNOSIS — K219 Gastro-esophageal reflux disease without esophagitis: Secondary | ICD-10-CM

## 2023-10-05 NOTE — Telephone Encounter (Signed)
 Requested Prescriptions  Refused Prescriptions Disp Refills   pantoprazole (PROTONIX) 20 MG tablet [Pharmacy Med Name: Pantoprazole Sodium Oral Tablet Delayed Release 20 MG] 90 tablet 3    Sig: TAKE 1 TABLET EVERY DAY     Gastroenterology: Proton Pump Inhibitors Passed - 10/05/2023  1:52 PM      Passed - Valid encounter within last 12 months    Recent Outpatient Visits           5 months ago Essential hypertension   [facility] [place], Salvadore Oxford, NP   1 year ago Encounter for general adult medical examination with abnormal findings   [place] [facility] [place], Salvadore Oxford, NP   1 year ago Mixed hyperlipidemia   [facility] [place], Salvadore Oxford, NP   2 years ago Encounter for general adult medical examination with abnormal findings   [place] [facility] [place], [place] W, NP   2 years ago Chronic bilateral low back pain without sciatica   [place] [place], Salvadore Oxford, NP       Future Appointments             In 4 days [place], Salvadore Oxford, NP  [facility], [facility]

## 2023-10-09 ENCOUNTER — Encounter: Payer: Self-pay | Admitting: Internal Medicine

## 2023-10-09 ENCOUNTER — Ambulatory Visit (INDEPENDENT_AMBULATORY_CARE_PROVIDER_SITE_OTHER): Payer: Medicare PPO | Admitting: Internal Medicine

## 2023-10-09 VITALS — BP 138/84 | Ht 59.0 in | Wt 202.6 lb

## 2023-10-09 DIAGNOSIS — Z1231 Encounter for screening mammogram for malignant neoplasm of breast: Secondary | ICD-10-CM

## 2023-10-09 DIAGNOSIS — E66813 Obesity, class 3: Secondary | ICD-10-CM

## 2023-10-09 DIAGNOSIS — E782 Mixed hyperlipidemia: Secondary | ICD-10-CM | POA: Diagnosis not present

## 2023-10-09 DIAGNOSIS — Z0001 Encounter for general adult medical examination with abnormal findings: Secondary | ICD-10-CM

## 2023-10-09 DIAGNOSIS — G252 Other specified forms of tremor: Secondary | ICD-10-CM

## 2023-10-09 DIAGNOSIS — Z6841 Body Mass Index (BMI) 40.0 and over, adult: Secondary | ICD-10-CM

## 2023-10-09 DIAGNOSIS — F419 Anxiety disorder, unspecified: Secondary | ICD-10-CM

## 2023-10-09 DIAGNOSIS — K219 Gastro-esophageal reflux disease without esophagitis: Secondary | ICD-10-CM

## 2023-10-09 DIAGNOSIS — R7303 Prediabetes: Secondary | ICD-10-CM | POA: Insufficient documentation

## 2023-10-09 DIAGNOSIS — I10 Essential (primary) hypertension: Secondary | ICD-10-CM

## 2023-10-09 DIAGNOSIS — Z780 Asymptomatic menopausal state: Secondary | ICD-10-CM

## 2023-10-09 MED ORDER — PANTOPRAZOLE SODIUM 20 MG PO TBEC: 20.0000 mg | DELAYED_RELEASE_TABLET | Freq: Every day | ORAL | 1 refills | Status: DC

## 2023-10-09 MED ORDER — CITALOPRAM HYDROBROMIDE 40 MG PO TABS: 40.0000 mg | ORAL_TABLET | Freq: Every day | ORAL | 1 refills | Status: DC

## 2023-10-09 MED ORDER — ARIPIPRAZOLE 5 MG PO TABS: 5.0000 mg | ORAL_TABLET | Freq: Every day | ORAL | 1 refills | Status: DC

## 2023-10-09 MED ORDER — LOSARTAN POTASSIUM 50 MG PO TABS: 50.0000 mg | ORAL_TABLET | Freq: Every day | ORAL | 1 refills | Status: DC

## 2023-10-09 MED ORDER — PROPRANOLOL HCL ER 120 MG PO CP24: 120.0000 mg | ORAL_CAPSULE | Freq: Every day | ORAL | 1 refills | Status: DC

## 2023-10-09 NOTE — Patient Instructions (Signed)
 Health Maintenance After Age 68 After age 4, you are at a higher risk for certain long-term diseases and infections as well as injuries from falls. Falls are a major cause of broken bones and head injuries in people who are older than age 47. Getting regular preventive care can help to keep you healthy and well. Preventive care includes getting regular testing and making lifestyle changes as recommended by your health care provider. Talk with your health care provider about: Which screenings and tests you should have. A screening is a test that checks for a disease when you have no symptoms. A diet and exercise plan that is right for you. What should I know about screenings and tests to prevent falls? Screening and testing are the best ways to find a health problem early. Early diagnosis and treatment give you the best chance of managing medical conditions that are common after age 37. Certain conditions and lifestyle choices may make you more likely to have a fall. Your health care provider may recommend: Regular vision checks. Poor vision and conditions such as cataracts can make you more likely to have a fall. If you wear glasses, make sure to get your prescription updated if your vision changes. Medicine review. Work with your health care provider to regularly review all of the medicines you are taking, including over-the-counter medicines. Ask your health care provider about any side effects that may make you more likely to have a fall. Tell your health care provider if any medicines that you take make you feel dizzy or sleepy. Strength and balance checks. Your health care provider may recommend certain tests to check your strength and balance while standing, walking, or changing positions. Foot health exam. Foot pain and numbness, as well as not wearing proper footwear, can make you more likely to have a fall. Screenings, including: Osteoporosis screening. Osteoporosis is a condition that causes  the bones to get weaker and break more easily. Blood pressure screening. Blood pressure changes and medicines to control blood pressure can make you feel dizzy. Depression screening. You may be more likely to have a fall if you have a fear of falling, feel depressed, or feel unable to do activities that you used to do. Alcohol use screening. Using too much alcohol can affect your balance and may make you more likely to have a fall. Follow these instructions at home: Lifestyle Do not drink alcohol if: Your health care provider tells you not to drink. If you drink alcohol: Limit how much you have to: 0-1 drink a day for women. 0-2 drinks a day for men. Know how much alcohol is in your drink. In the [place] one drink equals one 12 oz bottle of beer (355 mL), one 5 oz glass of wine (148 mL), or one 1 oz glass of hard liquor (44 mL). Do not use any products that contain nicotine or tobacco. These products include cigarettes, chewing tobacco, and vaping devices, such as e-cigarettes. If you need help quitting, ask your health care provider. Activity  Follow a regular exercise program to stay fit. This will help you maintain your balance. Ask your health care provider what types of exercise are appropriate for you. If you need a cane or walker, use it as recommended by your health care provider. Wear supportive shoes that have nonskid soles. Safety  Remove any tripping hazards, such as rugs, cords, and clutter. Install safety equipment such as grab bars in bathrooms and safety rails on stairs. Keep rooms and walkways  well-lit. General instructions Talk with your health care provider about your risks for falling. Tell your health care provider if: You fall. Be sure to tell your health care provider about all falls, even ones that seem minor. You feel dizzy, tiredness (fatigue), or off-balance. Take over-the-counter and prescription medicines only as told by your health care provider. These include  supplements. Eat a healthy diet and maintain a healthy weight. A healthy diet includes low-fat dairy products, low-fat (lean) meats, and fiber from whole grains, beans, and lots of fruits and vegetables. Stay current with your vaccines. Schedule regular health, dental, and eye exams. Summary Having a healthy lifestyle and getting preventive care can help to protect your health and wellness after age 11. Screening and testing are the best way to find a health problem early and help you avoid having a fall. Early diagnosis and treatment give you the best chance for managing medical conditions that are more common for people who are older than age 28. Falls are a major cause of broken bones and head injuries in people who are older than age 48. Take precautions to prevent a fall at home. Work with your health care provider to learn what changes you can make to improve your health and wellness and to prevent falls. This information is not intended to replace advice given to you by your health care provider. Make sure you discuss any questions you have with your health care provider. Document Revised: 12/10/2020 Document Reviewed: 12/10/2020 Elsevier Patient Education  2024 [employer].

## 2023-10-09 NOTE — Progress Notes (Signed)
 Subjective:    Patient ID: Elizabeth [name], female    DOB: 11-12-1955, 68 y.o.   MRN: [id number]  HPI  Patient presents to clinic today for her annual exam.  Flu: 04/2023 Tetanus: 02/2013 COVID: never Pneumovax: 02/2013 Prevnar: 09/2021 Shingrix: never Pap smear: 05/2021 Mammogram: 03/2023 Bone density: 10/2020 Colon screening: 06/2023 Vision screening: annually Dentist: as needed  Diet: She does eat meat. She consumes fruits and veggies. She tries to avoid fried foods. She drinks mostly soda. Exercise: None   Review of Systems     Past Medical History:  Diagnosis Date   Allergy 08/04/1998   Anxiety    Arthritis 08/04/2008   CKD (chronic kidney disease) stage 3, GFR 30-59 ml/min (HCC) 03/04/2021   Depression    GERD (gastroesophageal reflux disease)    Hypertension 11/02/1997   Insomnia    Mixed hyperlipidemia    Obesity     Current Outpatient Medications  Medication Sig Dispense Refill   acetaminophen (TYLENOL) 500 MG tablet Take 1,000 mg by mouth daily as needed.     amoxicillin-clavulanate (AUGMENTIN) 875-125 MG tablet Take 1 tablet by mouth 2 (two) times daily. 14 tablet 0   ARIPiprazole (ABILIFY) 5 MG tablet Take 1 tablet (5 mg total) by mouth daily. 90 tablet 1   aspirin 81 MG tablet Take 81 mg by mouth daily.     atorvastatin (LIPITOR) 10 MG tablet TAKE 1 TABLET EVERY DAY 90 tablet 1   benzonatate (TESSALON) 100 MG capsule Take 1 capsule (100 mg total) by mouth 3 (three) times daily as needed for cough. 30 capsule 0   Blood Pressure Monitoring (SM WRIST CUFF BP MONITOR) MISC 1 Units by Does not apply route daily. 1 each 0   carbidopa-levodopa (SINEMET IR) 25-100 MG tablet Take 1 tablet by mouth 3 (three) times daily.     cetirizine (ZYRTEC) 10 MG tablet Take 10 mg by mouth daily.     chlorpheniramine (CHLOR-TRIMETON) 4 MG tablet Take 4 mg by mouth 2 (two) times daily as needed for allergies.     citalopram (CELEXA) 40 MG tablet Take 1 tablet (40 mg total) by  mouth daily. 90 tablet 1   cyclobenzaprine (FLEXERIL) 5 MG tablet TAKE 1 TABLET THREE TIMES DAILY AS NEEDED FOR MUSCLE SPASM(S) 90 tablet 1   fluticasone (FLONASE) 50 MCG/ACT nasal spray USE 1 SPRAY IN EACH NOSTRIL EVERY DAY 16 g 2   losartan (COZAAR) 50 MG tablet Take 1 tablet (50 mg total) by mouth daily. 90 tablet 1   Multiple Vitamin (MULTIVITAMIN) tablet Take 1 tablet by mouth daily.     Omega-3 Fatty Acids (FISH OIL) 1000 MG CAPS Take by mouth.     oseltamivir (TAMIFLU) 75 MG capsule Take 1 capsule (75 mg total) by mouth 2 (two) times daily. For 5 days (Patient not taking: Reported on 10/02/2023) 10 capsule 0   pantoprazole (PROTONIX) 20 MG tablet TAKE 1 TABLET EVERY DAY 90 tablet 2   pregabalin (LYRICA) 25 MG capsule Take 25 mg by mouth 2 (two) times daily.     propranolol ER (INDERAL LA) 120 MG 24 hr capsule Take 1 capsule (120 mg total) by mouth daily. 90 capsule 1   No current facility-administered medications for this visit.    Allergies  Allergen Reactions   Ace Inhibitors Cough    Family History  Problem Relation Age of Onset   Breast cancer Maternal Aunt 51   Alzheimer's disease Mother    Stroke Father  Hypertension Father    Multiple sclerosis Sister    Hypertension Sister    Early death Sister    Colon cancer Sister    Liver cancer Sister    Breast cancer Cousin        mat cousin   Parkinson's disease Paternal Uncle    Early death Sister    Hypertension Sister     Social History   Socioeconomic History   Marital status: Widowed    Spouse name: Not on file   Number of children: 2   Years of education: Not on file   Highest education level: 12th grade  Occupational History   Not on file  Tobacco Use   Smoking status: Former    Current packs/day: 0.00    Average packs/day: 0.3 packs/day for 40.0 years (10.0 ttl pk-yrs)    Types: Cigarettes    Start date: 07/28/1982    Quit date: 07/28/2022    Years since quitting: 1.2   Smokeless tobacco: Never    Tobacco comments:    pt smoking 8 cigs daily  Vaping Use   Vaping status: Every Day  Substance and Sexual Activity   Alcohol use: Yes    Comment: about once a month    Drug use: No   Sexual activity: Not Currently    Birth control/protection: Post-menopausal  Other Topics Concern   Not on file  Social History Narrative   Not on file   Social Drivers of Health   Financial Resource Strain: Low Risk  (10/02/2023)   Overall Financial Resource Strain (CARDIA)    Difficulty of Paying Living Expenses: Not hard at all  Food Insecurity: No Food Insecurity (10/02/2023)   Hunger Vital Sign    Worried About Running Out of Food in the Last Year: Never true    Ran Out of Food in the Last Year: Never true  Transportation Needs: No Transportation Needs (10/02/2023)   PRAPARE - Transportation    Lack of Transportation (Medical): No    Lack of Transportation (Non-Medical): No  Physical Activity: Insufficiently Active (10/02/2023)   Exercise Vital Sign    Days of Exercise per Week: 2 days    Minutes of Exercise per Session: 20 min  Stress: No Stress Concern Present (10/02/2023)   [employer] of Occupational Health - Occupational Stress Questionnaire    Feeling of Stress : Not at all  Social Connections: Moderately Isolated (10/02/2023)   Social Connection and Isolation Panel [NHANES]    Frequency of Communication with Friends and Family: More than three times a week    Frequency of Social Gatherings with Friends and Family: Twice a week    Attends Religious Services: More than 4 times per year    Active Member of [employer] or Organizations: No    Attends Banker Meetings: Never    Marital Status: Widowed  Intimate Partner Violence: Not At Risk (10/02/2023)   Humiliation, Afraid, Rape, and Kick questionnaire    Fear of Current or Ex-Partner: No    Emotionally Abused: No    Physically Abused: No    Sexually Abused: No     Constitutional: Denies fever, malaise, fatigue, headache  or abrupt weight changes.  HEENT: Denies eye pain, eye redness, ear pain, ringing in the ears, wax buildup, runny nose, nasal congestion, bloody nose, or sore throat. Respiratory: Denies difficulty breathing, shortness of breath, cough or sputum production.   Cardiovascular: Denies chest pain, chest tightness, palpitations or swelling in the hands or feet.  Gastrointestinal:  Denies abdominal pain, bloating, constipation, diarrhea or blood in the stool.  GU: Denies urgency, frequency, pain with urination, burning sensation, blood in urine, odor or discharge. Musculoskeletal: Patient reports chronic joint pain, knee pain.  Denies decrease in range of motion, difficulty with gait, muscle pain or joint swelling.  Skin: Denies redness, rashes, lesions or ulcercations.  Neurological: Denies dizziness, difficulty with memory, difficulty with speech or problems with balance and coordination.  Psych: Patient has a history of anxiety and depression.  Denies SI/HI.  No other specific complaints in a complete review of systems (except as listed in HPI above).  Objective:   Physical Exam  BP 138/84 (BP Location: Left Arm, Patient Position: Sitting, Cuff Size: Normal)   Ht 4\' 11"  (1.499 m)   Wt 202 lb 9.6 oz (91.9 kg)   BMI 40.92 kg/m    Wt Readings from Last 3 Encounters:  07/14/23 195 lb (88.5 kg)  06/11/23 195 lb 3.2 oz (88.5 kg)  04/15/23 199 lb (90.3 kg)    General: Appears her stated age, obese, in NAD. Skin: Warm, dry and intact.  HEENT: Head: normal shape and size; Eyes: sclera white, no icterus, conjunctiva pink, PERRLA and EOMs intact;  Neck:  Neck supple, trachea midline. No masses, lumps or thyromegaly present.  Cardiovascular: Normal rate and rhythm. S1,S2 noted.  No murmur, rubs or gallops noted. No JVD or BLE edema. No carotid bruits noted. Pulmonary/Chest: Normal effort and positive vesicular breath sounds. No respiratory distress. No wheezes, rales or ronchi noted.  Abdomen:  Soft and nontender. Normal bowel sounds.  Musculoskeletal: Strength 5/5 BUE/BLE. No difficulty with gait.  Neurological: Alert and oriented. Cranial nerves II-XII grossly intact. Coordination normal.  Psychiatric: Mood and affect normal. Behavior is normal. Judgment and thought content normal.     BMET    Component Value Date/Time   NA 140 04/15/2023 0927   NA 145 (H) 04/09/2017 1431   K 4.3 04/15/2023 0927   CL 106 04/15/2023 0927   CO2 25 04/15/2023 0927   GLUCOSE 103 (H) 04/15/2023 0927   BUN 14 04/15/2023 0927   BUN 6 (L) 04/09/2017 1431   CREATININE 1.22 (H) 04/15/2023 0927   CALCIUM 9.5 04/15/2023 0927   GFRNONAA 45 (L) 09/04/2020 0941   GFRAA 52 (L) 09/04/2020 0941    Lipid Panel     Component Value Date/Time   CHOL 135 04/15/2023 0927   CHOL 253 (H) 08/08/2015 1323   TRIG 88 04/15/2023 0927   HDL 49 (L) 04/15/2023 0927   HDL 58 08/08/2015 1323   CHOLHDL 2.8 04/15/2023 0927   VLDL 27 03/03/2017 1058   LDLCALC 69 04/15/2023 0927    CBC    Component Value Date/Time   WBC 7.4 04/15/2023 0927   RBC 3.84 04/15/2023 0927   HGB 11.6 (L) 04/15/2023 0927   HGB 11.6 04/09/2017 1431   HCT 36.0 04/15/2023 0927   HCT 34.5 04/09/2017 1431   PLT 437 (H) 04/15/2023 0927   PLT 376 04/09/2017 1431   MCV 93.8 04/15/2023 0927   MCV 92 04/09/2017 1431   MCH 30.2 04/15/2023 0927   MCHC 32.2 04/15/2023 0927   RDW 12.8 04/15/2023 0927   RDW 13.7 04/09/2017 1431   LYMPHSABS 1,699 09/04/2020 0941   LYMPHSABS 1.6 04/09/2017 1431   MONOABS 385 03/03/2017 1058   EOSABS 158 09/04/2020 0941   EOSABS 0.2 04/09/2017 1431   BASOSABS 79 09/04/2020 0941   BASOSABS 0.0 04/09/2017 1431    Hgb A1C Lab  Results  Component Value Date   HGBA1C 5.8 (H) 04/15/2023           Assessment & Plan:   Preventative Health Maintenance:  Flu shot UTD Tetanus UTD Encouraged her to get her COVID-vaccine Pneumovax and Prevnar UTD Discussed Shingrix vaccine, she will check coverage with  her insurance company and schedule visit if she would like to have this done Pap smear UTD Mammogram ordered-she will call to schedule Bone density ordered-she will call to schedule Colon screening UTD Encouraged her to consume a balanced diet and exercise regimen Advised her to see an eye doctor and dentist annually Will check CBC, c-Met, lipid, A1c today  RTC in 6 months, follow-up chronic conditions Nicki Reaper, NP

## 2023-10-09 NOTE — Assessment & Plan Note (Signed)
 Encourage diet and exercise for weight loss

## 2023-10-10 LAB — COMPLETE METABOLIC PANEL WITH GFR
AG Ratio: 1.5 (calc) (ref 1.0–2.5)
ALT: 14 U/L (ref 6–29)
AST: 14 U/L (ref 10–35)
Albumin: 4.2 g/dL (ref 3.6–5.1)
Alkaline phosphatase (APISO): 90 U/L (ref 37–153)
BUN/Creatinine Ratio: 12 (calc) (ref 6–22)
BUN: 16 mg/dL (ref 7–25)
CO2: 26 mmol/L (ref 20–32)
Calcium: 10 mg/dL (ref 8.6–10.4)
Chloride: 106 mmol/L (ref 98–110)
Creat: 1.38 mg/dL — ABNORMAL HIGH (ref 0.50–1.05)
Globulin: 2.8 g/dL (ref 1.9–3.7)
Glucose, Bld: 97 mg/dL (ref 65–99)
Potassium: 4.4 mmol/L (ref 3.5–5.3)
Sodium: 142 mmol/L (ref 135–146)
Total Bilirubin: 0.5 mg/dL (ref 0.2–1.2)
Total Protein: 7 g/dL (ref 6.1–8.1)
eGFR: 42 mL/min/{1.73_m2} — ABNORMAL LOW (ref >=60)

## 2023-10-10 LAB — LIPID PANEL
Cholesterol: 166 mg/dL (ref <200)
HDL: 59 mg/dL (ref >=50)
LDL Cholesterol (Calc): 90 mg/dL
Non-HDL Cholesterol (Calc): 107 mg/dL (ref <130)
Total CHOL/HDL Ratio: 2.8 (calc) (ref <5.0)
Triglycerides: 84 mg/dL (ref <150)

## 2023-10-10 LAB — CBC
HCT: 39.2 % (ref 35.0–45.0)
Hemoglobin: 13 g/dL (ref 11.7–15.5)
MCH: 30.3 pg (ref 27.0–33.0)
MCHC: 33.2 g/dL (ref 32.0–36.0)
MCV: 91.4 fL (ref 80.0–100.0)
MPV: 10.1 fL (ref 7.5–12.5)
Platelets: 421 10*3/uL — ABNORMAL HIGH (ref 140–400)
RBC: 4.29 10*6/uL (ref 3.80–5.10)
RDW: 12.6 % (ref 11.0–15.0)
WBC: 8.4 10*3/uL (ref 3.8–10.8)

## 2023-10-10 LAB — HEMOGLOBIN A1C
Hgb A1c MFr Bld: 5.5 %{Hb} (ref <5.7)
Mean Plasma Glucose: 111 mg/dL
eAG (mmol/L): 6.2 mmol/L

## 2023-10-12 ENCOUNTER — Encounter: Payer: Self-pay | Admitting: Internal Medicine

## 2023-10-12 DIAGNOSIS — N1831 Chronic kidney disease, stage 3a: Secondary | ICD-10-CM

## 2023-10-14 ENCOUNTER — Other Ambulatory Visit: Payer: Self-pay | Admitting: Nephrology

## 2023-10-14 DIAGNOSIS — N1832 Chronic kidney disease, stage 3b: Secondary | ICD-10-CM

## 2023-10-14 DIAGNOSIS — I10 Essential (primary) hypertension: Secondary | ICD-10-CM

## 2023-10-23 ENCOUNTER — Ambulatory Visit

## 2023-11-06 ENCOUNTER — Telehealth: Admitting: Nurse Practitioner

## 2023-11-06 DIAGNOSIS — J301 Allergic rhinitis due to pollen: Secondary | ICD-10-CM | POA: Diagnosis not present

## 2023-11-07 MED ORDER — LEVOCETIRIZINE DIHYDROCHLORIDE 5 MG PO TABS: 5.0000 mg | ORAL_TABLET | Freq: Every evening | ORAL | 0 refills | Status: DC

## 2023-11-07 MED ORDER — PATADAY 0.7 % OP SOLN: 1.0000 [drp] | Freq: Two times a day (BID) | OPHTHALMIC | 0 refills | Status: DC

## 2023-11-07 NOTE — Progress Notes (Signed)
 I have spent 5 minutes in review of e-visit questionnaire, review and updating patient chart, medical decision making and response to patient.   Claiborne Rigg, NP

## 2023-11-07 NOTE — Progress Notes (Signed)
 E visit for Allergic Rhinitis We are sorry that you are not feeling well.  Here is how we plan to help!  Based on what you have shared with me it looks like you have Allergic Rhinitis.  Rhinitis is when a reaction occurs that causes nasal congestion, runny nose, sneezing, and itching.  Most types of rhinitis are caused by an inflammation and are associated with symptoms in the eyes ears or throat. There are several types of rhinitis.  The most common are acute rhinitis, which is usually caused by a viral illness, allergic or seasonal rhinitis, and nonallergic or year-round rhinitis.  Nasal allergies occur certain times of the year.  Allergic rhinitis is caused when allergens in the air trigger the release of histamine in the body.  Histamine causes itching, swelling, and fluid to build up in the fragile linings of the nasal passages, sinuses and eyelids.  An itchy nose and clear discharge are common.  I recommend the following over the counter treatments which I have sent to the pharmacy: Xyzal 5 mg take 1 tablet daily instead of claritin or zyrtec  I also would recommend a nasal spray: Atrovent as instructed on label  You may also benefit from eye drops such as: Pataday eye drops  HOME CARE:  You can use an over-the-counter saline nasal spray as needed Avoid areas where there is heavy dust, mites, or molds Stay indoors on windy days during the pollen season Keep windows closed in home, at least in bedroom; use air conditioner. Use high-efficiency house air filter Keep windows closed in car, turn AC on re-circulate Avoid playing out with dog during pollen season  GET HELP RIGHT AWAY IF:  If your symptoms do not improve within 10 days You become short of breath You develop yellow or green discharge from your nose for over 3 days You have coughing fits  MAKE SURE YOU:  Understand these instructions Will watch your condition Will get help right away if you are not doing well or get  worse  Thank you for choosing an e-visit. Your e-visit answers were reviewed by a board certified advanced clinical practitioner to complete your personal care plan. Depending upon the condition, your plan could have included both over the counter or prescription medications. Please review your pharmacy choice. Be sure that the pharmacy you have chosen is open so that you can pick up your prescription now.  If there is a problem you may message your provider in MyChart to have the prescription routed to another pharmacy. Your safety is important to [place]. If you have drug allergies check your prescription carefully.  For the next 24 hours, you can use MyChart to ask questions about today's visit, request a non-urgent call back, or ask for a work or school excuse from your e-visit provider. You will get an email in the next two days asking about your experience. I hope that your e-visit has been valuable and will speed your recovery.

## 2024-02-15 ENCOUNTER — Encounter: Payer: Self-pay | Admitting: Internal Medicine

## 2024-02-15 DIAGNOSIS — R936 Abnormal findings on diagnostic imaging of limbs: Secondary | ICD-10-CM

## 2024-02-15 DIAGNOSIS — M7989 Other specified soft tissue disorders: Secondary | ICD-10-CM

## 2024-02-19 ENCOUNTER — Ambulatory Visit
Admission: RE | Admit: 2024-02-19 | Discharge: 2024-02-19 | Disposition: A | Discharge status: Home / Self Care | Source: Ambulatory Visit | Attending: Internal Medicine | Admitting: Internal Medicine

## 2024-02-19 ENCOUNTER — Encounter: Payer: Self-pay | Admitting: Internal Medicine

## 2024-02-19 ENCOUNTER — Telehealth: Payer: Self-pay | Admitting: Internal Medicine

## 2024-02-19 ENCOUNTER — Ambulatory Visit: Admitting: Internal Medicine

## 2024-02-19 VITALS — BP 132/80 | Ht 59.0 in | Wt 200.4 lb

## 2024-02-19 DIAGNOSIS — M7989 Other specified soft tissue disorders: Secondary | ICD-10-CM | POA: Insufficient documentation

## 2024-02-19 DIAGNOSIS — M25562 Pain in left knee: Secondary | ICD-10-CM

## 2024-02-19 DIAGNOSIS — W19XXXA Unspecified fall, initial encounter: Secondary | ICD-10-CM

## 2024-02-19 MED ORDER — FUROSEMIDE 20 MG PO TABS: 20.0000 mg | ORAL_TABLET | Freq: Every day | ORAL | 0 refills | Status: DC

## 2024-02-19 NOTE — Progress Notes (Signed)
 Subjective:    Patient ID: Elizabeth [name], female    DOB: [birth year], 68 y.o.   MRN: [id number]  HPI   Discussed the use of AI scribe software for clinical note transcription with the patient, who gave verbal consent to proceed.  Elizabeth [name] is a 68 year old female who presents with swelling in her legs.  She noticed swelling in her legs over the weekend, initially affecting both legs but now primarily in the right ankle and significantly more in the left leg. The swelling is uncomfortable but not painful.  She has noticed some pain in the left calf but has not noticed any redness or warmth.  Elevating her legs at night has not reduced the swelling.  She stopped taking atorvastatin  this week due to concerns it was causing soreness in her legs. No history of similar swelling, chest pain, shortness of breath, or increased salt intake. No recent travel, long car rides, or history of blood clots.   Approximately two and a half to four weeks ago, she fell face forward, landing on her left knee, which has since been numb underneath. Despite the fall, she does not feel she has been more sedentary.  She only noticed swelling in the left leg after the fall.      Review of Systems     Past Medical History:  Diagnosis Date   Allergy 08/04/1998   Anxiety    Arthritis 08/04/2008   CKD (chronic kidney disease) stage 3, GFR 30-59 ml/min (HCC) 03/04/2021   Depression    GERD (gastroesophageal reflux disease)    Hypertension 11/02/1997   Insomnia    Mixed hyperlipidemia    Obesity     Current Outpatient Medications  Medication Sig Dispense Refill   acetaminophen (TYLENOL) 500 MG tablet Take 1,000 mg by mouth daily as needed.     ARIPiprazole  (ABILIFY ) 5 MG tablet Take 1 tablet (5 mg total) by mouth daily. 90 tablet 1   aspirin 81 MG tablet Take 81 mg by mouth daily.     atorvastatin  (LIPITOR) 10 MG tablet TAKE 1 TABLET EVERY DAY 90 tablet 1   Blood Pressure Monitoring (SM WRIST  CUFF BP MONITOR) MISC 1 Units by Does not apply route daily. 1 each 0   carbidopa-levodopa (SINEMET IR) 25-100 MG tablet Take 1 tablet by mouth 3 (three) times daily.     chlorpheniramine (CHLOR-TRIMETON) 4 MG tablet Take 4 mg by mouth 2 (two) times daily as needed for allergies.     citalopram  (CELEXA ) 40 MG tablet Take 1 tablet (40 mg total) by mouth daily. 90 tablet 1   Cobalamin Combinations (B-12) 1000-400 MCG SUBL      cyclobenzaprine  (FLEXERIL ) 5 MG tablet TAKE 1 TABLET THREE TIMES DAILY AS NEEDED FOR MUSCLE SPASM(S) 90 tablet 1   levocetirizine (XYZAL ) 5 MG tablet Take 1 tablet (5 mg total) by mouth every evening. 30 tablet 0   losartan  (COZAAR ) 50 MG tablet Take 1 tablet (50 mg total) by mouth daily. 90 tablet 1   Multiple Vitamin (MULTIVITAMIN) tablet Take 1 tablet by mouth daily.     Olopatadine  HCl (PATADAY ) 0.7 % SOLN Apply 1 drop to eye 2 (two) times daily. 2.5 mL 0   Omega-3 Fatty Acids (FISH OIL ) 1000 MG CAPS Take by mouth.     pantoprazole  (PROTONIX ) 20 MG tablet Take 1 tablet (20 mg total) by mouth daily. 90 tablet 1   pregabalin (LYRICA) 25 MG capsule Take 25 mg by mouth  2 (two) times daily.     propranolol  ER (INDERAL  LA) 120 MG 24 hr capsule Take 1 capsule (120 mg total) by mouth daily. 90 capsule 1   No current facility-administered medications for this visit.    Allergies  Allergen Reactions   Ace Inhibitors Cough    Family History  Problem Relation Age of Onset   Breast cancer Maternal Aunt 85   Alzheimer's disease Mother    Stroke Father    Hypertension Father    Multiple sclerosis Sister    Hypertension Sister    Early death Sister    Colon cancer Sister    Liver cancer Sister    Breast cancer Cousin        mat cousin   Parkinson's disease Paternal Uncle    Early death Sister    Hypertension Sister     Social History   Socioeconomic History   Marital status: Widowed    Spouse name: Not on file   Number of children: 2   Years of education: Not  on file   Highest education level: 12th grade  Occupational History   Not on file  Tobacco Use   Smoking status: Former    Current packs/day: 0.00    Average packs/day: 0.3 packs/day for 40.0 years (10.0 ttl pk-yrs)    Types: Cigarettes    Start date: 07/28/1982    Quit date: 07/28/2022    Years since quitting: 1.5   Smokeless tobacco: Never   Tobacco comments:    pt smoking 8 cigs daily  Vaping Use   Vaping status: Every Day  Substance and Sexual Activity   Alcohol use: Yes    Comment: about once a month    Drug use: No   Sexual activity: Not Currently    Birth control/protection: Post-menopausal  Other Topics Concern   Not on file  Social History Narrative   Not on file   Social Drivers of Health   Financial Resource Strain: Low Risk  (02/18/2024)   Overall Financial Resource Strain (CARDIA)    Difficulty of Paying Living Expenses: Not hard at all  Food Insecurity: No Food Insecurity (02/18/2024)   Hunger Vital Sign    Worried About Running Out of Food in the Last Year: Never true    Ran Out of Food in the Last Year: Never true  Transportation Needs: No Transportation Needs (02/18/2024)   PRAPARE - Administrator, Civil Service (Medical): No    Lack of Transportation (Non-Medical): No  Physical Activity: Inactive (02/18/2024)   Exercise Vital Sign    Days of Exercise per Week: 0 days    Minutes of Exercise per Session: Not on file  Stress: No Stress Concern Present (02/18/2024)   [employer] of Occupational Health - Occupational Stress Questionnaire    Feeling of Stress: Only a little  Social Connections: Unknown (02/18/2024)   Social Connection and Isolation Panel    Frequency of Communication with Friends and Family: Patient declined    Frequency of Social Gatherings with Friends and Family: More than three times a week    Attends Religious Services: More than 4 times per year    Active Member of [employer] or Organizations: Patient declined    Attends  Banker Meetings: Not on file    Marital Status: Widowed  Intimate Partner Violence: Not At Risk (10/02/2023)   Humiliation, Afraid, Rape, and Kick questionnaire    Fear of Current or Ex-Partner: No    Emotionally Abused:  No    Physically Abused: No    Sexually Abused: No     Constitutional: Denies fever, malaise, fatigue, headache or abrupt weight changes.  Respiratory: Denies difficulty breathing, shortness of breath, cough or sputum production.   Cardiovascular: Patient reports left leg swelling.  Denies chest pain, chest tightness, palpitations or swelling in the hands.  Musculoskeletal: Patient reports chronic joint pain, left knee pain.  Denies decrease in range of motion, difficulty with gait, muscle pain or joint swelling.  Skin: Denies redness, rashes, lesions or ulcercations.  Neurological: Patient reports numbness in left knee.  Denies dizziness, difficulty with memory, difficulty with speech or problems with balance and coordination.   No other specific complaints in a complete review of systems (except as listed in HPI above).  Objective:   Physical Exam  BP 132/80 (BP Location: Right Arm, Patient Position: Sitting, Cuff Size: Normal)   Ht 4' 11 (1.499 m)   Wt 200 lb 6.4 oz (90.9 kg)   BMI 40.48 kg/m     Wt Readings from Last 3 Encounters:  10/09/23 202 lb 9.6 oz (91.9 kg)  07/14/23 195 lb (88.5 kg)  06/11/23 195 lb 3.2 oz (88.5 kg)    General: Appears her stated age, obese, in NAD. Skin: Warm, dry and intact.  No redness, warmth or ulcerations noted of the left lower extremity. HEENT: Head: normal shape and size; Eyes: sclera white, no icterus, conjunctiva pink, PERRLA and EOMs intact;  Cardiovascular: Normal rate and rhythm. S1,S2 noted.  No murmur, rubs or gallops noted. No JVD.  1+ pitting edema noted in the left lower extremity.  No significant swelling noted in the right leg.  Pedal +2+ bilaterally.  Negative Homans' sign on the  left. Pulmonary/Chest: Normal effort and positive vesicular breath sounds. No respiratory distress. No wheezes, rales or ronchi noted.  Musculoskeletal: Normal flexion extension of the left knee.  Crepitus noted with range of motion of the left knee.  Joint enlargement noted of the left knee.  No discrete swelling of the knee noted.  Difficulty with gait. Neurological: Alert and oriented.   BMET    Component Value Date/Time   NA 142 10/09/2023 0900   NA 145 (H) 04/09/2017 1431   K 4.4 10/09/2023 0900   CL 106 10/09/2023 0900   CO2 26 10/09/2023 0900   GLUCOSE 97 10/09/2023 0900   BUN 16 10/09/2023 0900   BUN 6 (L) 04/09/2017 1431   CREATININE 1.38 (H) 10/09/2023 0900   CALCIUM  10.0 10/09/2023 0900   GFRNONAA 45 (L) 09/04/2020 0941   GFRAA 52 (L) 09/04/2020 0941    Lipid Panel     Component Value Date/Time   CHOL 166 10/09/2023 0900   CHOL 253 (H) 08/08/2015 1323   TRIG 84 10/09/2023 0900   HDL 59 10/09/2023 0900   HDL 58 08/08/2015 1323   CHOLHDL 2.8 10/09/2023 0900   VLDL 27 03/03/2017 1058   LDLCALC 90 10/09/2023 0900    CBC    Component Value Date/Time   WBC 8.4 10/09/2023 0900   RBC 4.29 10/09/2023 0900   HGB 13.0 10/09/2023 0900   HGB 11.6 04/09/2017 1431   HCT 39.2 10/09/2023 0900   HCT 34.5 04/09/2017 1431   PLT 421 (H) 10/09/2023 0900   PLT 376 04/09/2017 1431   MCV 91.4 10/09/2023 0900   MCV 92 04/09/2017 1431   MCH 30.3 10/09/2023 0900   MCHC 33.2 10/09/2023 0900   RDW 12.6 10/09/2023 0900   RDW 13.7 04/09/2017  1431   LYMPHSABS 1,699 09/04/2020 0941   LYMPHSABS 1.6 04/09/2017 1431   MONOABS 385 03/03/2017 1058   EOSABS 158 09/04/2020 0941   EOSABS 0.2 04/09/2017 1431   BASOSABS 79 09/04/2020 0941   BASOSABS 0.0 04/09/2017 1431    Hgb A1C Lab Results  Component Value Date   HGBA1C 5.5 10/09/2023           Assessment & Plan:   Assessment and Plan    Unilateral leg swelling Acute left leg swelling with calf soreness suggests DVT,  especially after recent fall. Unilateral swelling makes DVT more likely than heart failure. Immediate ultrasound prioritized to rule out DVT. - Order stat ultrasound of the left leg to rule out DVT. - Order BMET, BNP test to assess for heart failure. - Advise her to keep left leg elevated above heart level. - Instruct her to reduce salt intake. - Advise her to go to the ER if symptoms worsen significantly over the weekend. - If ultrasound negative for DVT and kidney function normal, consider furosemide 20 mg in 5 days to help reduce swelling  Discontinuation of atorvastatin  Atorvastatin  is not known to cause leg swelling. It is safe to resume atorvastatin . - Advise her to resume atorvastatin .   RTC in 2 months, follow-up chronic conditions [name], NP

## 2024-02-19 NOTE — Addendum Note (Signed)
 Addended by: ZELIA GAUZE D on: 02/19/2024 04:43 PM   Modules accepted: Orders

## 2024-02-19 NOTE — Telephone Encounter (Signed)
 Patient advised per Angeline -   Negative DVT, Rx for Furosemide 20 mg #5 sent to pharmacy. Patient verbalized understanding.

## 2024-02-19 NOTE — Patient Instructions (Signed)
 Peripheral Edema  Peripheral edema is swelling that is caused by a buildup of fluid. Peripheral edema most often affects the lower legs, ankles, and feet. It can also develop in the arms, hands, and face. The area of the body that has peripheral edema will look swollen. It may also feel heavy or warm. Your clothes may start to feel tight. Pressing on the area may make a temporary dent in your skin (pitting edema). You may not be able to move your swollen arm or leg as much as usual. There are many causes of peripheral edema. It can happen because of a complication of other conditions such as heart failure, kidney disease, or a problem with your circulation. It also can be a side effect of certain medicines or happen because of an infection. It often happens to women during pregnancy. Sometimes, the cause is not known. Follow these instructions at home: Managing pain, stiffness, and swelling  Raise (elevate) your legs while you are sitting or lying down. Move around often to prevent stiffness and to reduce swelling. Do not sit or stand for long periods of time. Do not wear tight clothing. Do not wear garters on your upper legs. Exercise your legs to get your circulation going. This helps to move the fluid back into your blood vessels, and it may help the swelling go down. Wear compression stockings as told by your health care provider. These stockings help to prevent blood clots and reduce swelling in your legs. It is important that these are the correct size. These stockings should be prescribed by your doctor to prevent possible injuries. If elastic bandages or wraps are recommended, use them as told by your health care provider. Medicines Take over-the-counter and prescription medicines only as told by your health care provider. Your health care provider may prescribe medicine to help your body get rid of excess water (diuretic). Take this medicine if you are told to take it. General  instructions Eat a low-salt (low-sodium) diet as told by your health care provider. Sometimes, eating less salt may reduce swelling. Pay attention to any changes in your symptoms. Moisturize your skin daily to help prevent skin from cracking and draining. Keep all follow-up visits. This is important. Contact a health care provider if: You have a fever. You have swelling in only one leg. You have increased swelling, redness, or pain in one or both of your legs. You have drainage or sores at the area where you have edema. Get help right away if: You have edema that starts suddenly or is getting worse, especially if you are pregnant or have a medical condition. You develop shortness of breath, especially when you are lying down. You have pain in your chest or abdomen. You feel weak. You feel like you will faint. These symptoms may be an emergency. Get help right away. Call 911. Do not wait to see if the symptoms will go away. Do not drive yourself to the hospital. Summary Peripheral edema is swelling that is caused by a buildup of fluid. Peripheral edema most often affects the lower legs, ankles, and feet. Move around often to prevent stiffness and to reduce swelling. Do not sit or stand for long periods of time. Pay attention to any changes in your symptoms. Contact a health care provider if you have edema that starts suddenly or is getting worse, especially if you are pregnant or have a medical condition. Get help right away if you develop shortness of breath, especially when lying down.  This information is not intended to replace advice given to you by your health care provider. Make sure you discuss any questions you have with your health care provider. Document Revised: 03/25/2021 Document Reviewed: 03/25/2021 Elsevier Patient Education  2024 [employer].

## 2024-02-19 NOTE — Telephone Encounter (Signed)
 CAL notified of US  results.

## 2024-02-19 NOTE — Telephone Encounter (Signed)
 Rosina from [facility] US  department calling to report results of US  lab results : IMPRESSION: No evidence of left lower extremity DVT. Oblong fluid collection along the calf measuring 14.8 x 0.8 x 5.1 cm. Please correlate for etiology. Further workup as clinically appropriate or follow-up to confirm clearance. Please correlate for history of trauma or infection amongst others.      Copied from CRM [id number]. Topic: Clinical - Lab/Test Results >> Feb 19, 2024  4:04 PM Myrick T wrote: Reason for CRM: Rosina from [facility] Ultrasound Dept called with results. Transferred to NT

## 2024-02-20 ENCOUNTER — Ambulatory Visit: Payer: Self-pay | Admitting: Internal Medicine

## 2024-02-20 LAB — BASIC METABOLIC PANEL WITH GFR
BUN/Creatinine Ratio: 12 (calc) (ref 6–22)
BUN: 16 mg/dL (ref 7–25)
CO2: 26 mmol/L (ref 20–32)
Calcium: 9.6 mg/dL (ref 8.6–10.4)
Chloride: 105 mmol/L (ref 98–110)
Creat: 1.39 mg/dL — ABNORMAL HIGH (ref 0.50–1.05)
Glucose, Bld: 80 mg/dL (ref 65–99)
Potassium: 4.4 mmol/L (ref 3.5–5.3)
Sodium: 137 mmol/L (ref 135–146)
eGFR: 41 mL/min/1.73m2 — ABNORMAL LOW (ref >=60)

## 2024-02-20 LAB — BRAIN NATRIURETIC PEPTIDE: Brain Natriuretic Peptide: 47 pg/mL (ref <100)

## 2024-02-24 ENCOUNTER — Other Ambulatory Visit: Payer: Self-pay | Admitting: Internal Medicine

## 2024-02-24 DIAGNOSIS — I10 Essential (primary) hypertension: Secondary | ICD-10-CM

## 2024-02-24 DIAGNOSIS — K219 Gastro-esophageal reflux disease without esophagitis: Secondary | ICD-10-CM

## 2024-02-25 ENCOUNTER — Telehealth: Admitting: Physician Assistant

## 2024-02-25 DIAGNOSIS — B9689 Other specified bacterial agents as the cause of diseases classified elsewhere: Secondary | ICD-10-CM

## 2024-02-25 DIAGNOSIS — J019 Acute sinusitis, unspecified: Secondary | ICD-10-CM

## 2024-02-25 MED ORDER — FLUTICASONE PROPIONATE 50 MCG/ACT NA SUSP: 2.0000 | Freq: Every day | NASAL | 6 refills | Status: AC

## 2024-02-25 MED ORDER — AMOXICILLIN-POT CLAVULANATE 875-125 MG PO TABS: 1.0000 | ORAL_TABLET | Freq: Two times a day (BID) | ORAL | 0 refills | Status: AC

## 2024-02-25 NOTE — Telephone Encounter (Signed)
 Requested Prescriptions  Refused Prescriptions Disp Refills   pantoprazole  (PROTONIX ) 20 MG tablet [Pharmacy Med Name: PANTOPRAZOLE  SODIUM 20 MG Oral Tablet Delayed Release] 90 tablet 3    Sig: TAKE 1 TABLET EVERY DAY     Gastroenterology: Proton Pump Inhibitors Passed - 02/25/2024  4:08 PM      Passed - Valid encounter within last 12 months    Recent Outpatient Visits           6 days ago Left leg swelling   [place] [facility] [place], [place] W, NP   4 months ago Encounter for general adult medical examination with abnormal findings   [facility] [place], Angeline ORN, NP   5 months ago Exposure to influenza   [facility], Angeline ORN, NP               losartan  (COZAAR ) 50 MG tablet [Pharmacy Med Name: LOSARTAN  POTASSIUM 50 MG Oral Tablet] 90 tablet 3    Sig: TAKE 1 TABLET EVERY DAY     Cardiovascular:  Angiotensin Receptor Blockers Failed - 02/25/2024  4:08 PM      Failed - Cr in normal range and within 180 days    Creat  Date Value Ref Range Status  02/19/2024 1.39 (H) 0.50 - 1.05 mg/dL Final         Passed - K in normal range and within 180 days    Potassium  Date Value Ref Range Status  02/19/2024 4.4 3.5 - 5.3 mmol/L Final         Passed - Patient is not pregnant      Passed - Last BP in normal range    BP Readings from Last 1 Encounters:  02/19/24 132/80         Passed - Valid encounter within last 6 months    Recent Outpatient Visits           6 days ago Left leg swelling   [place] [facility] [place], Angeline ORN, NP   4 months ago Encounter for general adult medical examination with abnormal findings   [place] [facility] [place], Angeline ORN, NP   5 months ago Exposure to influenza   [facility] [place], Angeline ORN, NP

## 2024-02-25 NOTE — Progress Notes (Signed)

## 2024-02-25 NOTE — Progress Notes (Signed)
 I have spent 5 minutes in review of e-visit questionnaire, review and updating patient chart, medical decision making and response to patient.   Laure Kidney, PA-C

## 2024-03-07 MED ORDER — FUROSEMIDE 20 MG PO TABS
20.0000 mg | ORAL_TABLET | Freq: Every day | ORAL | 0 refills | Status: DC
Start: 2024-03-07 — End: 2024-04-20

## 2024-03-07 NOTE — Addendum Note (Signed)
 Addended by: ANTONETTE ANGELINE ORN on: 03/07/2024 10:14 AM   Modules accepted: Orders

## 2024-03-17 NOTE — Addendum Note (Signed)
 Addended by: ANTONETTE ANGELINE ORN on: 03/17/2024 08:40 AM   Modules accepted: Orders

## 2024-04-11 ENCOUNTER — Telehealth: Admitting: Physician Assistant

## 2024-04-11 DIAGNOSIS — R062 Wheezing: Secondary | ICD-10-CM

## 2024-04-12 ENCOUNTER — Encounter (INDEPENDENT_AMBULATORY_CARE_PROVIDER_SITE_OTHER): Payer: Self-pay | Admitting: Vascular Surgery

## 2024-04-12 ENCOUNTER — Ambulatory Visit (INDEPENDENT_AMBULATORY_CARE_PROVIDER_SITE_OTHER): Admitting: Vascular Surgery

## 2024-04-12 VITALS — BP 128/80 | HR 67 | Ht 59.0 in | Wt 199.2 lb

## 2024-04-12 DIAGNOSIS — M7989 Other specified soft tissue disorders: Secondary | ICD-10-CM | POA: Insufficient documentation

## 2024-04-12 DIAGNOSIS — N1831 Chronic kidney disease, stage 3a: Secondary | ICD-10-CM

## 2024-04-12 DIAGNOSIS — I10 Essential (primary) hypertension: Secondary | ICD-10-CM

## 2024-04-12 NOTE — Assessment & Plan Note (Signed)
 Can worsen her lower extremity edema and also precludes the use of anti-inflammatories.

## 2024-04-12 NOTE — Assessment & Plan Note (Signed)
 She had an ultrasound done about 6 weeks ago which showed no DVT or superficial thrombophlebitis present.  There was significant fluid in the left calf area.  This was of unclear etiology and source by the report. Clinically, she does not have any significant venous disease to explain her symptoms.  The most likely cause of this fluid collection and her symptoms would be that with her significant fall, she ruptured a Baker's cyst in her left knee.  The fluid tracking into the calf does usually happen days to weeks after the initial injury and persists for often times several months before it resolves.  The inflammatory fluid causes significant swelling in the leg.  I have recommended she wear compression socks on a daily basis.  I recommend she elevate her legs to reduce the swelling as well.  She cannot tolerate anti-inflammatories due to her chronic kidney disease.  These are usually used to help but will not be an option in her case.  I will plan to see her back in a few months to reassess her leg.

## 2024-04-12 NOTE — Assessment & Plan Note (Signed)
 blood pressure control important in reducing the progression of atherosclerotic disease. On appropriate oral medications.

## 2024-04-12 NOTE — Progress Notes (Signed)
   Thank you for the details you included in the comment boxes. Those details are very helpful in determining the best course of treatment for you and help us  to provide the best care.Because of associated wheezing, we recommend that you schedule a Virtual Urgent Care video visit in order for the provider to better assess what is going on.  The provider will be able to give you a more accurate diagnosis and treatment plan if we can more freely discuss your symptoms and with the addition of a virtual examination.   If you change your visit to a video visit, we will bill your insurance (similar to an office visit) and you will not be charged for this e-Visit. You will be able to stay at home and speak with the first available [facility] advanced practice provider. The link to do a video visit is in the drop down Menu tab of your Welcome screen in MyChart.

## 2024-04-12 NOTE — Progress Notes (Signed)
 Patient ID: Elizabeth [name], female   DOB: 10-06-1955, 68 y.o.   MRN: [id number]  Chief Complaint  Patient presents with   Establish Care    HPI Elizabeth [name] is a 68 y.o. female.  I am asked to see the patient by [name] for evaluation of left leg swelling.  The patient reports about 3 months ago, she had a significant trauma with a fall directly on her left knee.  About 3 weeks later, she began to notice pain and swelling in the calf and lower leg that was not really present initially after the fall.  She denies any open wounds or infection.  No fevers or chills.  No drainage from the tissue.  She had an ultrasound done about 6 weeks ago which showed no DVT or superficial thrombophlebitis present.  There was significant fluid in the left calf area.  This was of unclear etiology and source by the report.  The patient does have some arthritis underlying prior to this fall.   Past Medical History:  Diagnosis Date   Allergy 08/04/1998   Anxiety    Arthritis 08/04/2008   CKD (chronic kidney disease) stage 3, GFR 30-59 ml/min (HCC) 03/04/2021   Depression    GERD (gastroesophageal reflux disease)    Hypertension 11/02/1997   Insomnia    Mixed hyperlipidemia    Obesity     Past Surgical History:  Procedure Laterality Date   CHOLECYSTECTOMY  2004   repeat 2014   COLONOSCOPY  08/05/2011   COLONOSCOPY WITH PROPOFOL  N/A 06/11/2023   Procedure: COLONOSCOPY WITH PROPOFOL ;  Surgeon: Therisa Bi, MD;  Location: [facility] ENDOSCOPY;  Service: Gastroenterology;  Laterality: N/A;   POLYPECTOMY  06/11/2023   Procedure: POLYPECTOMY;  Surgeon: Therisa Bi, MD;  Location: [facility] ENDOSCOPY;  Service: Gastroenterology;;     Family History  Problem Relation Age of Onset   Breast cancer Maternal Aunt 73   Alzheimer's disease Mother    Arthritis Mother    Stroke Father    Hypertension Father    Multiple sclerosis Sister    Hypertension Sister    Early death Sister    Colon cancer  Sister    Liver cancer Sister    Breast cancer Cousin        mat cousin   Parkinson's disease Paternal Uncle    Early death Sister    Hypertension Sister    Cancer Sister       Social History   Tobacco Use   Smoking status: Former    Current packs/day: 0.00    Average packs/day: 0.3 packs/day for 40.0 years (10.0 ttl pk-yrs)    Types: Cigarettes    Start date: 07/28/1982    Quit date: 07/28/2022    Years since quitting: 1.7   Smokeless tobacco: Never   Tobacco comments:    pt smoking 8 cigs daily  Vaping Use   Vaping status: Every Day  Substance Use Topics   Alcohol use: Yes    Comment: about once a month    Drug use: No     Allergies  Allergen Reactions   Ace Inhibitors Cough    Current Outpatient Medications  Medication Sig Dispense Refill   acetaminophen (TYLENOL) 500 MG tablet Take 1,000 mg by mouth daily as needed.     ARIPiprazole  (ABILIFY ) 5 MG tablet Take 1 tablet (5 mg total) by mouth daily. 90 tablet 1   aspirin 81 MG tablet Take 81 mg by mouth daily.  atorvastatin  (LIPITOR) 10 MG tablet TAKE 1 TABLET EVERY DAY (Patient not taking: Reported on 04/12/2024) 90 tablet 1   Blood Pressure Monitoring (SM WRIST CUFF BP MONITOR) MISC 1 Units by Does not apply route daily. 1 each 0   carbidopa-levodopa (SINEMET IR) 25-100 MG tablet Take 1 tablet by mouth 3 (three) times daily.     chlorpheniramine (CHLOR-TRIMETON) 4 MG tablet Take 4 mg by mouth 2 (two) times daily as needed for allergies.     citalopram  (CELEXA ) 40 MG tablet Take 1 tablet (40 mg total) by mouth daily. 90 tablet 1   Cobalamin Combinations (B-12) 1000-400 MCG SUBL      cyclobenzaprine  (FLEXERIL ) 5 MG tablet TAKE 1 TABLET THREE TIMES DAILY AS NEEDED FOR MUSCLE SPASM(S) 90 tablet 1   fluticasone  (FLONASE ) 50 MCG/ACT nasal spray Place 2 sprays into both nostrils daily. 16 g 6   furosemide  (LASIX ) 20 MG tablet Take 1 tablet (20 mg total) by mouth daily. 5 tablet 0   losartan  (COZAAR ) 50 MG tablet Take  1 tablet (50 mg total) by mouth daily. 90 tablet 1   Multiple Vitamin (MULTIVITAMIN) tablet Take 1 tablet by mouth daily.     Omega-3 Fatty Acids (FISH OIL ) 1000 MG CAPS Take by mouth.     pantoprazole  (PROTONIX ) 20 MG tablet Take 1 tablet (20 mg total) by mouth daily. 90 tablet 1   pregabalin (LYRICA) 25 MG capsule Take 25 mg by mouth 2 (two) times daily.     propranolol  ER (INDERAL  LA) 120 MG 24 hr capsule Take 1 capsule (120 mg total) by mouth daily. 90 capsule 1   No current facility-administered medications for this visit.      REVIEW OF SYSTEMS (Negative unless checked)  Constitutional: [] Weight loss  [] Fever  [] Chills Cardiac: [] Chest pain   [] Chest pressure   [] Palpitations   [] Shortness of breath when laying flat   [] Shortness of breath at rest   [] Shortness of breath with exertion. Vascular:  [] Pain in legs with walking   [] Pain in legs at rest   [] Pain in legs when laying flat   [] Claudication   [] Pain in feet when walking  [] Pain in feet at rest  [] Pain in feet when laying flat   [] History of DVT   [] Phlebitis   [x] Swelling in legs   [] Varicose veins   [] Non-healing ulcers Pulmonary:   [] Uses home oxygen   [] Productive cough   [] Hemoptysis   [] Wheeze  [] COPD   [] Asthma Neurologic:  [] Dizziness  [] Blackouts   [] Seizures   [] History of stroke   [] History of TIA  [] Aphasia   [] Temporary blindness   [] Dysphagia   [] Weakness or numbness in arms   [] Weakness or numbness in legs Musculoskeletal:  [x] Arthritis   [] Joint swelling   [x] Joint pain   [] Low back pain Hematologic:  [] Easy bruising  [] Easy bleeding   [] Hypercoagulable state   [] Anemic  [] Hepatitis Gastrointestinal:  [] Blood in stool   [] Vomiting blood  [x] Gastroesophageal reflux/heartburn   [] Abdominal pain Genitourinary:  [x] Chronic kidney disease   [] Difficult urination  [] Frequent urination  [] Burning with urination   [] Hematuria Skin:  [] Rashes   [] Ulcers   [] Wounds Psychological:  [x] History of anxiety   []  History of major  depression.    Physical Exam BP 128/80   Pulse 67   Ht 4' 11 (1.499 m)   Wt 199 lb 4 oz (90.4 kg)   BMI 40.24 kg/m  Gen:  WD/WN, NAD Head: [place]/AT, No temporalis wasting. Ear/Nose/Throat: Hearing  grossly intact, nares w/o erythema or drainage, oropharynx w/o Erythema/Exudate Eyes: Conjunctiva clear, sclera non-icteric  Neck: trachea midline.  No JVD.  Pulmonary:  Good air movement, respirations not labored, no use of accessory muscles  Cardiac: RRR, no JVD Vascular:  Vessel Right Left  Radial Palpable Palpable                                   Gastrointestinal:. No masses, surgical incisions, or scars. Musculoskeletal: M/S 5/5 throughout.  Extremities without ischemic changes.  No deformity or atrophy.  1-2+ left lower extremity edema. Neurologic: Sensation grossly intact in extremities.  Symmetrical.  Speech is fluent. Motor exam as listed above. Psychiatric: Judgment intact, Mood & affect appropriate for pt's clinical situation. Dermatologic: No rashes or ulcers noted.  No cellulitis or open wounds.    Radiology No results found.  Labs Recent Results (from the past 2160 hours)  Basic metabolic panel with GFR     Status: Abnormal   Collection Time: 02/19/24  2:17 PM  Result Value Ref Range   Glucose, Bld 80 65 - 99 mg/dL    Comment: .            Fasting reference interval .    BUN 16 7 - 25 mg/dL   Creat 8.60 (H) 9.49 - 1.05 mg/dL   eGFR 41 (L) > OR = 60 mL/min/1.77m2   BUN/Creatinine Ratio 12 6 - 22 (calc)   Sodium 137 135 - 146 mmol/L   Potassium 4.4 3.5 - 5.3 mmol/L   Chloride 105 98 - 110 mmol/L   CO2 26 20 - 32 mmol/L   Calcium  9.6 8.6 - 10.4 mg/dL  B Nat Peptide     Status: None   Collection Time: 02/19/24  2:17 PM  Result Value Ref Range   Brain Natriuretic Peptide 47 <100 pg/mL    Comment: . BNP levels increase with age in the general population with the highest values seen in individuals greater than 29 years of age. Reference: J. Am. Penne.  Cardiol. 2002; 59:023-017. .     Assessment/Plan:  Swelling of limb She had an ultrasound done about 6 weeks ago which showed no DVT or superficial thrombophlebitis present.  There was significant fluid in the left calf area.  This was of unclear etiology and source by the report. Clinically, she does not have any significant venous disease to explain her symptoms.  The most likely cause of this fluid collection and her symptoms would be that with her significant fall, she ruptured a Baker's cyst in her left knee.  The fluid tracking into the calf does usually happen days to weeks after the initial injury and persists for often times several months before it resolves.  The inflammatory fluid causes significant swelling in the leg.  I have recommended she wear compression socks on a daily basis.  I recommend she elevate her legs to reduce the swelling as well.  She cannot tolerate anti-inflammatories due to her chronic kidney disease.  These are usually used to help but will not be an option in her case.  I will plan to see her back in a few months to reassess her leg.  CKD (chronic kidney disease) stage 3, GFR 30-59 ml/min (HCC) Can worsen her lower extremity edema and also precludes the use of anti-inflammatories.  Hypertension blood pressure control important in reducing the progression of atherosclerotic disease. On appropriate oral medications.  Selinda Gu 04/12/2024, 10:18 AM   This note was created with Dragon medical transcription system.  Any errors from dictation are unintentional.

## 2024-04-13 ENCOUNTER — Ambulatory Visit: Admitting: Internal Medicine

## 2024-04-13 NOTE — Progress Notes (Deleted)
 Subjective:    Patient ID: Elizabeth [name], female    DOB: 07-27-1956, 68 y.o.   MRN: [id number]  HPI  Patient presents to clinic today for 62-month follow-up of chronic conditions.  HTN: Her BP today is 124/78.  She is taking losartan , furosemide  and propranolol  as prescribed.  There is no ECG on file.  HLD: Her last LDL was 90, triglycerides 84, 10/2023.  She denies myalgias on atorvastatin .  She tries to consume a low-fat diet.  Anxiety and depression: Chronic, managed on citalopram  and aripiprazole .  She is not currently seeing a therapist.  She denies SI/HI.  OA/chronic back pain: Managed with lyrica, turmeric, voltaren  gel, tylenol and cyclobenzaprine .  She does not follow with orthopedics.  GERD: Triggered by spicy foods.  She denies breakthrough on pantoprazole .  There is no upper GI on file.  Osteopenia: She is taking calcium  and vitamin D  OTC.  She does not get much weightbearing exercise daily.  Bone density from 10/2020 reviewed.  Drug-induced parkinsonism: She reports mainly a tremor.  She is taking sinemet and propranolol  as prescribed.  She follows with neurology.  CKD 3: Her last creatinine was 1.31, GFR 44, 02/2024.  She is on losartan  for renal protection.  She follows with nephrology.  Prediabetes: Her last A1c was 5.5%, 10/2023.  She is not taking any oral diabetic medication at this time.  She does not check her sugars.  Thrombocytopenia: Her last platelet count was 421, 10/2023.  She does not follow with hematology.  Review of Systems     Past Medical History:  Diagnosis Date   Allergy 08/04/1998   Anxiety    Arthritis 08/04/2008   CKD (chronic kidney disease) stage 3, GFR 30-59 ml/min (HCC) 03/04/2021   Depression    GERD (gastroesophageal reflux disease)    Hypertension 11/02/1997   Insomnia    Mixed hyperlipidemia    Obesity     Current Outpatient Medications  Medication Sig Dispense Refill   acetaminophen (TYLENOL) 500 MG tablet Take 1,000 mg by  mouth daily as needed.     ARIPiprazole  (ABILIFY ) 5 MG tablet Take 1 tablet (5 mg total) by mouth daily. 90 tablet 1   aspirin 81 MG tablet Take 81 mg by mouth daily.     atorvastatin  (LIPITOR) 10 MG tablet TAKE 1 TABLET EVERY DAY (Patient not taking: Reported on 04/12/2024) 90 tablet 1   Blood Pressure Monitoring (SM WRIST CUFF BP MONITOR) MISC 1 Units by Does not apply route daily. 1 each 0   carbidopa-levodopa (SINEMET IR) 25-100 MG tablet Take 1 tablet by mouth 3 (three) times daily.     chlorpheniramine (CHLOR-TRIMETON) 4 MG tablet Take 4 mg by mouth 2 (two) times daily as needed for allergies.     citalopram  (CELEXA ) 40 MG tablet Take 1 tablet (40 mg total) by mouth daily. 90 tablet 1   Cobalamin Combinations (B-12) 1000-400 MCG SUBL      cyclobenzaprine  (FLEXERIL ) 5 MG tablet TAKE 1 TABLET THREE TIMES DAILY AS NEEDED FOR MUSCLE SPASM(S) 90 tablet 1   fluticasone  (FLONASE ) 50 MCG/ACT nasal spray Place 2 sprays into both nostrils daily. 16 g 6   furosemide  (LASIX ) 20 MG tablet Take 1 tablet (20 mg total) by mouth daily. 5 tablet 0   losartan  (COZAAR ) 50 MG tablet Take 1 tablet (50 mg total) by mouth daily. 90 tablet 1   Multiple Vitamin (MULTIVITAMIN) tablet Take 1 tablet by mouth daily.     Omega-3 Fatty Acids (FISH  OIL) 1000 MG CAPS Take by mouth.     pantoprazole  (PROTONIX ) 20 MG tablet Take 1 tablet (20 mg total) by mouth daily. 90 tablet 1   pregabalin (LYRICA) 25 MG capsule Take 25 mg by mouth 2 (two) times daily.     propranolol  ER (INDERAL  LA) 120 MG 24 hr capsule Take 1 capsule (120 mg total) by mouth daily. 90 capsule 1   No current facility-administered medications for this visit.    Allergies  Allergen Reactions   Ace Inhibitors Cough    Family History  Problem Relation Age of Onset   Breast cancer Maternal Aunt 56   Alzheimer's disease Mother    Arthritis Mother    Stroke Father    Hypertension Father    Multiple sclerosis Sister    Hypertension Sister    Early  death Sister    Colon cancer Sister    Liver cancer Sister    Breast cancer Cousin        mat cousin   Parkinson's disease Paternal Uncle    Early death Sister    Hypertension Sister    Cancer Sister     Social History   Socioeconomic History   Marital status: Widowed    Spouse name: Not on file   Number of children: 2   Years of education: Not on file   Highest education level: 12th grade  Occupational History   Not on file  Tobacco Use   Smoking status: Former    Current packs/day: 0.00    Average packs/day: 0.3 packs/day for 40.0 years (10.0 ttl pk-yrs)    Types: Cigarettes    Start date: 07/28/1982    Quit date: 07/28/2022    Years since quitting: 1.7   Smokeless tobacco: Never   Tobacco comments:    pt smoking 8 cigs daily  Vaping Use   Vaping status: Every Day  Substance and Sexual Activity   Alcohol use: Yes    Comment: about once a month    Drug use: No   Sexual activity: Not Currently    Birth control/protection: Post-menopausal  Other Topics Concern   Not on file  Social History Narrative   Not on file   Social Drivers of Health   Financial Resource Strain: Low Risk  (02/18/2024)   Overall Financial Resource Strain (CARDIA)    Difficulty of Paying Living Expenses: Not hard at all  Food Insecurity: No Food Insecurity (02/18/2024)   Hunger Vital Sign    Worried About Running Out of Food in the Last Year: Never true    Ran Out of Food in the Last Year: Never true  Transportation Needs: No Transportation Needs (02/18/2024)   PRAPARE - Administrator, Civil Service (Medical): No    Lack of Transportation (Non-Medical): No  Physical Activity: Inactive (02/18/2024)   Exercise Vital Sign    Days of Exercise per Week: 0 days    Minutes of Exercise per Session: Not on file  Stress: No Stress Concern Present (02/18/2024)   [employer] of Occupational Health - Occupational Stress Questionnaire    Feeling of Stress: Only a little  Social  Connections: Unknown (02/18/2024)   Social Connection and Isolation Panel    Frequency of Communication with Friends and Family: Patient declined    Frequency of Social Gatherings with Friends and Family: More than three times a week    Attends Religious Services: More than 4 times per year    Active Member of Clubs or  Organizations: Patient declined    Attends Banker Meetings: Not on file    Marital Status: Widowed  Intimate Partner Violence: Not At Risk (10/02/2023)   Humiliation, Afraid, Rape, and Kick questionnaire    Fear of Current or Ex-Partner: No    Emotionally Abused: No    Physically Abused: No    Sexually Abused: No     Constitutional: Denies fever, malaise, fatigue, headache or abrupt weight changes.  HEENT: Denies eye pain, eye redness, ear pain, ringing in the ears, wax buildup, runny nose, nasal congestion, bloody nose, or sore throat. Respiratory: Denies difficulty breathing, shortness of breath, cough or sputum production.   Cardiovascular: Denies chest pain, chest tightness, palpitations or swelling in the hands or feet.  Gastrointestinal: Denies abdominal pain, bloating, constipation, diarrhea or blood in the stool.  GU: Denies urgency, frequency, pain with urination, burning sensation, blood in urine, odor or discharge. Musculoskeletal: Patient reports joint pain.  Denies decrease in range of motion, difficulty with gait, muscle pain or joint swelling.  Skin: Denies redness, rashes, lesions or ulcercations.  Neurological: Patient reports tremor.  Denies dizziness, difficulty with memory, difficulty with speech or problems with balance and coordination.  Psych: Patient has a history of anxiety and depression.  Denies SI/HI.  No other specific complaints in a complete review of systems (except as listed in HPI above).  Objective:   Physical Exam  There were no vitals taken for this visit.   Wt Readings from Last 3 Encounters:  04/12/24 199 lb 4 oz  (90.4 kg)  02/19/24 200 lb 6.4 oz (90.9 kg)  10/09/23 202 lb 9.6 oz (91.9 kg)    General: Appears her stated age, obese, in NAD. Skin: Warm, dry and intact.  HEENT: Head: normal shape and size; Eyes: sclera white, no icterus, conjunctiva pink, PERRLA and EOMs intact;  Cardiovascular: Normal rate and rhythm. S1,S2 noted.  No murmur, rubs or gallops noted. No JVD or BLE edema. No carotid bruits noted. Pulmonary/Chest: Normal effort and positive vesicular breath sounds. No respiratory distress. No wheezes, rales or ronchi noted.  Abdomen: Soft and nontender. Normal bowel sounds.  Musculoskeletal: No difficulty with gait.  Neurological: Alert and oriented.  Coordination normal.  Psychiatric: Mood and affect normal. Behavior is normal. Judgment and thought content normal.     BMET    Component Value Date/Time   NA 137 02/19/2024 1417   NA 145 (H) 04/09/2017 1431   K 4.4 02/19/2024 1417   CL 105 02/19/2024 1417   CO2 26 02/19/2024 1417   GLUCOSE 80 02/19/2024 1417   BUN 16 02/19/2024 1417   BUN 6 (L) 04/09/2017 1431   CREATININE 1.39 (H) 02/19/2024 1417   CALCIUM  9.6 02/19/2024 1417   GFRNONAA 45 (L) 09/04/2020 0941   GFRAA 52 (L) 09/04/2020 0941    Lipid Panel     Component Value Date/Time   CHOL 166 10/09/2023 0900   CHOL 253 (H) 08/08/2015 1323   TRIG 84 10/09/2023 0900   HDL 59 10/09/2023 0900   HDL 58 08/08/2015 1323   CHOLHDL 2.8 10/09/2023 0900   VLDL 27 03/03/2017 1058   LDLCALC 90 10/09/2023 0900    CBC    Component Value Date/Time   WBC 8.4 10/09/2023 0900   RBC 4.29 10/09/2023 0900   HGB 13.0 10/09/2023 0900   HGB 11.6 04/09/2017 1431   HCT 39.2 10/09/2023 0900   HCT 34.5 04/09/2017 1431   PLT 421 (H) 10/09/2023 0900   PLT 376  04/09/2017 1431   MCV 91.4 10/09/2023 0900   MCV 92 04/09/2017 1431   MCH 30.3 10/09/2023 0900   MCHC 33.2 10/09/2023 0900   RDW 12.6 10/09/2023 0900   RDW 13.7 04/09/2017 1431   LYMPHSABS 1,699 09/04/2020 0941   LYMPHSABS  1.6 04/09/2017 1431   MONOABS 385 03/03/2017 1058   EOSABS 158 09/04/2020 0941   EOSABS 0.2 04/09/2017 1431   BASOSABS 79 09/04/2020 0941   BASOSABS 0.0 04/09/2017 1431    Hgb A1C Lab Results  Component Value Date   HGBA1C 5.5 10/09/2023           Assessment & Plan:     RTC in 6 months for your annual exam [name], NP

## 2024-04-19 ENCOUNTER — Ambulatory Visit: Admitting: Internal Medicine

## 2024-04-20 ENCOUNTER — Encounter: Payer: Self-pay | Admitting: Internal Medicine

## 2024-04-20 ENCOUNTER — Ambulatory Visit: Admitting: Internal Medicine

## 2024-04-20 VITALS — BP 122/80 | Ht 59.0 in | Wt 199.2 lb

## 2024-04-20 DIAGNOSIS — G252 Other specified forms of tremor: Secondary | ICD-10-CM | POA: Diagnosis not present

## 2024-04-20 DIAGNOSIS — J432 Centrilobular emphysema: Secondary | ICD-10-CM | POA: Diagnosis not present

## 2024-04-20 DIAGNOSIS — E782 Mixed hyperlipidemia: Secondary | ICD-10-CM

## 2024-04-20 DIAGNOSIS — M150 Primary generalized (osteo)arthritis: Secondary | ICD-10-CM

## 2024-04-20 DIAGNOSIS — G2119 Other drug induced secondary parkinsonism: Secondary | ICD-10-CM | POA: Diagnosis not present

## 2024-04-20 DIAGNOSIS — F39 Unspecified mood [affective] disorder: Secondary | ICD-10-CM

## 2024-04-20 DIAGNOSIS — K219 Gastro-esophageal reflux disease without esophagitis: Secondary | ICD-10-CM | POA: Diagnosis not present

## 2024-04-20 DIAGNOSIS — Z23 Encounter for immunization: Secondary | ICD-10-CM

## 2024-04-20 DIAGNOSIS — R7303 Prediabetes: Secondary | ICD-10-CM

## 2024-04-20 DIAGNOSIS — D75839 Thrombocytosis, unspecified: Secondary | ICD-10-CM | POA: Insufficient documentation

## 2024-04-20 DIAGNOSIS — M85851 Other specified disorders of bone density and structure, right thigh: Secondary | ICD-10-CM

## 2024-04-20 DIAGNOSIS — Z6841 Body Mass Index (BMI) 40.0 and over, adult: Secondary | ICD-10-CM

## 2024-04-20 DIAGNOSIS — N1832 Chronic kidney disease, stage 3b: Secondary | ICD-10-CM

## 2024-04-20 DIAGNOSIS — I10 Essential (primary) hypertension: Secondary | ICD-10-CM

## 2024-04-20 MED ORDER — CITALOPRAM HYDROBROMIDE 40 MG PO TABS: 40.0000 mg | ORAL_TABLET | Freq: Every day | ORAL | 1 refills | Status: AC

## 2024-04-20 MED ORDER — LOSARTAN POTASSIUM 50 MG PO TABS: 50.0000 mg | ORAL_TABLET | Freq: Every day | ORAL | 1 refills | Status: AC

## 2024-04-20 MED ORDER — TRELEGY ELLIPTA 100-62.5-25 MCG/ACT IN AEPB: 1.0000 | INHALATION_SPRAY | Freq: Every day | RESPIRATORY_TRACT | 1 refills | Status: DC

## 2024-04-20 MED ORDER — ARIPIPRAZOLE 5 MG PO TABS: 5.0000 mg | ORAL_TABLET | Freq: Every day | ORAL | 1 refills | Status: AC

## 2024-04-20 MED ORDER — PROPRANOLOL HCL ER 120 MG PO CP24: 120.0000 mg | ORAL_CAPSULE | Freq: Every day | ORAL | 1 refills | Status: AC

## 2024-04-20 MED ORDER — PANTOPRAZOLE SODIUM 20 MG PO TBEC: 20.0000 mg | DELAYED_RELEASE_TABLET | Freq: Every day | ORAL | 1 refills | Status: AC

## 2024-04-20 NOTE — Assessment & Plan Note (Signed)
 Avoid foods that trigger reflux Encourage weight loss as this can help reduce reflux symptoms Continue pantoprazole  20 mg daily, failed previous wean attempt

## 2024-04-20 NOTE — Assessment & Plan Note (Signed)
 CBC reviewed

## 2024-04-20 NOTE — Assessment & Plan Note (Signed)
 Controlled on losartan  50 mg and propranolol  120 mg daily Reinforced DASH diet and exercise for weight loss Kidney function reviewed

## 2024-04-20 NOTE — Assessment & Plan Note (Signed)
 Encouraged regular stretching Continue pregabalin 25 mg BID, voltaren  gel 1% TID prn, tylenol 1000 mg Q8H prn and cyclobenzaprine  Q8H prn She is currently following with neurology but considering pain management appt

## 2024-04-20 NOTE — Assessment & Plan Note (Signed)
 A1C today Encouraged low carb diet and exercise for weight loss

## 2024-04-20 NOTE — Assessment & Plan Note (Signed)
 Continue calcium  and vitamin D  OTC Encouraged weightbearing exercise daily

## 2024-04-20 NOTE — Assessment & Plan Note (Signed)
 Encourage diet and exercise for weight loss

## 2024-04-20 NOTE — Progress Notes (Signed)
 Subjective:    Patient ID: Elizabeth [name], female    DOB: 10-31-1955, 68 y.o.   MRN: [id number]  HPI  Patient presents to clinic today for 84-month follow-up of chronic conditions.  HTN: Her BP today is 122/80.  She is taking losartan  and propranolol  as prescribed.  There is no ECG on file.  HLD: Her last LDL was 90, triglycerides 84, 10/2023.  She denies myalgias on atorvastatin  (3 days a week).  She tries to consume a low-fat diet.  Anxiety and depression: Chronic, managed on citalopram  and aripiprazole .  She is not currently seeing a therapist.  She denies SI/HI.  OA/chronic back pain: She is a surgical candidate but does not want injections or surgery. Managed with lyrica, voltaren  gel, tylenol and cyclobenzaprine .  She is following with neurology but they are considering sending her to pain management.  GERD: Triggered by spicy foods.  She denies breakthrough on pantoprazole . She has tried to wean this in the past without success. There is no upper GI on file.  Osteopenia: She is taking calcium  and vitamin D  OTC.  She does not get much weightbearing exercise daily.  Bone density from 10/2020 reviewed.  Drug-induced parkinsonism: She reports mainly a tremor.  She is taking sinemet and propranolol  as prescribed.  She follows with neurology.  CKD 3: Her last creatinine was 1.31, GFR 44, 02/2024.  She is on losartan  for renal protection.  She follows with nephrology.  Prediabetes: Her last A1c was 5.5%, 10/2023.  She is not taking any oral diabetic medication at this time.  She does not check her sugars.  Thrombocytosis: Her last platelet count was 365, 03/2024.  She does not follow with hematology.  COPD: She reports chronic cough and shortness of breath. She is not using any inhalers at this time. There are no PFT's on file. She no longer smokes.  Review of Systems     Past Medical History:  Diagnosis Date   Allergy 08/04/1998   Anxiety    Arthritis 08/04/2008   CKD (chronic  kidney disease) stage 3, GFR 30-59 ml/min (HCC) 03/04/2021   Depression    GERD (gastroesophageal reflux disease)    Hypertension 11/02/1997   Insomnia    Mixed hyperlipidemia    Obesity     Current Outpatient Medications  Medication Sig Dispense Refill   acetaminophen (TYLENOL) 500 MG tablet Take 1,000 mg by mouth daily as needed.     ARIPiprazole  (ABILIFY ) 5 MG tablet Take 1 tablet (5 mg total) by mouth daily. 90 tablet 1   aspirin 81 MG tablet Take 81 mg by mouth daily.     atorvastatin  (LIPITOR) 10 MG tablet TAKE 1 TABLET EVERY DAY 90 tablet 1   Blood Pressure Monitoring (SM WRIST CUFF BP MONITOR) MISC 1 Units by Does not apply route daily. 1 each 0   carbidopa-levodopa (SINEMET IR) 25-100 MG tablet Take 1 tablet by mouth 3 (three) times daily.     citalopram  (CELEXA ) 40 MG tablet Take 1 tablet (40 mg total) by mouth daily. 90 tablet 1   Cobalamin Combinations (B-12) 1000-400 MCG SUBL      cyclobenzaprine  (FLEXERIL ) 5 MG tablet TAKE 1 TABLET THREE TIMES DAILY AS NEEDED FOR MUSCLE SPASM(S) 90 tablet 1   fluticasone  (FLONASE ) 50 MCG/ACT nasal spray Place 2 sprays into both nostrils daily. 16 g 6   losartan  (COZAAR ) 50 MG tablet Take 1 tablet (50 mg total) by mouth daily. 90 tablet 1   Multiple Vitamin (MULTIVITAMIN) tablet  Take 1 tablet by mouth daily.     Omega-3 Fatty Acids (FISH OIL ) 1000 MG CAPS Take by mouth.     pantoprazole  (PROTONIX ) 20 MG tablet Take 1 tablet (20 mg total) by mouth daily. 90 tablet 1   pregabalin (LYRICA) 25 MG capsule Take 25 mg by mouth 2 (two) times daily.     propranolol  ER (INDERAL  LA) 120 MG 24 hr capsule Take 1 capsule (120 mg total) by mouth daily. 90 capsule 1   No current facility-administered medications for this visit.    Allergies  Allergen Reactions   Ace Inhibitors Cough    Family History  Problem Relation Age of Onset   Breast cancer Maternal Aunt 84   Alzheimer's disease Mother    Arthritis Mother    Stroke Father    Hypertension  Father    Multiple sclerosis Sister    Hypertension Sister    Early death Sister    Colon cancer Sister    Liver cancer Sister    Breast cancer Cousin        mat cousin   Parkinson's disease Paternal Uncle    Early death Sister    Hypertension Sister    Cancer Sister     Social History   Socioeconomic History   Marital status: Widowed    Spouse name: Not on file   Number of children: 2   Years of education: Not on file   Highest education level: 12th grade  Occupational History   Not on file  Tobacco Use   Smoking status: Former    Current packs/day: 0.00    Average packs/day: 0.3 packs/day for 40.0 years (10.0 ttl pk-yrs)    Types: Cigarettes    Start date: 07/28/1982    Quit date: 07/28/2022    Years since quitting: 1.7   Smokeless tobacco: Never   Tobacco comments:    pt smoking 8 cigs daily  Vaping Use   Vaping status: Every Day  Substance and Sexual Activity   Alcohol use: Yes    Comment: about once a month    Drug use: No   Sexual activity: Not Currently    Birth control/protection: Post-menopausal  Other Topics Concern   Not on file  Social History Narrative   Not on file   Social Drivers of Health   Financial Resource Strain: Low Risk  (02/18/2024)   Overall Financial Resource Strain (CARDIA)    Difficulty of Paying Living Expenses: Not hard at all  Food Insecurity: No Food Insecurity (02/18/2024)   Hunger Vital Sign    Worried About Running Out of Food in the Last Year: Never true    Ran Out of Food in the Last Year: Never true  Transportation Needs: No Transportation Needs (02/18/2024)   PRAPARE - Administrator, Civil Service (Medical): No    Lack of Transportation (Non-Medical): No  Physical Activity: Inactive (02/18/2024)   Exercise Vital Sign    Days of Exercise per Week: 0 days    Minutes of Exercise per Session: Not on file  Stress: No Stress Concern Present (02/18/2024)   [employer] of Occupational Health - Occupational  Stress Questionnaire    Feeling of Stress: Only a little  Social Connections: Unknown (02/18/2024)   Social Connection and Isolation Panel    Frequency of Communication with Friends and Family: Patient declined    Frequency of Social Gatherings with Friends and Family: More than three times a week    Attends Religious Services:  More than 4 times per year    Active Member of Clubs or Organizations: Patient declined    Attends Banker Meetings: Not on file    Marital Status: Widowed  Intimate Partner Violence: Not At Risk (10/02/2023)   Humiliation, Afraid, Rape, and Kick questionnaire    Fear of Current or Ex-Partner: No    Emotionally Abused: No    Physically Abused: No    Sexually Abused: No     Constitutional: Denies fever, malaise, fatigue, headache or abrupt weight changes.  HEENT: Denies eye pain, eye redness, ear pain, ringing in the ears, wax buildup, runny nose, nasal congestion, bloody nose, or sore throat. Respiratory: Pt reports chronic cough and shortness of breath. Denies difficulty breathing, or sputum production.   Cardiovascular: Denies chest pain, chest tightness, palpitations or swelling in the hands or feet.  Gastrointestinal: Denies abdominal pain, bloating, constipation, diarrhea or blood in the stool.  GU: Denies urgency, frequency, pain with urination, burning sensation, blood in urine, odor or discharge. Musculoskeletal: Patient reports joint pain.  Denies decrease in range of motion, difficulty with gait, muscle pain or joint swelling.  Skin: Denies redness, rashes, lesions or ulcercations.  Neurological: Patient reports tremor.  Denies dizziness, difficulty with memory, difficulty with speech or problems with balance and coordination.  Psych: Patient has a history of anxiety and depression.  Denies SI/HI.  No other specific complaints in a complete review of systems (except as listed in HPI above).  Objective:   Physical Exam  BP 122/80 (BP  Location: Left Arm, Patient Position: Sitting, Cuff Size: Normal)   Ht 4' 11 (1.499 m)   Wt 199 lb 4 oz (90.4 kg)   BMI 40.24 kg/m    Wt Readings from Last 3 Encounters:  04/20/24 199 lb 4 oz (90.4 kg)  04/12/24 199 lb 4 oz (90.4 kg)  02/19/24 200 lb 6.4 oz (90.9 kg)    General: Appears her stated age, obese, in NAD. Skin: Warm, dry and intact.  HEENT: Head: normal shape and size; Eyes: sclera white, no icterus, conjunctiva pink, PERRLA and EOMs intact;  Cardiovascular: Normal rate and rhythm. S1,S2 noted.  No murmur, rubs or gallops noted. No JVD or BLE edema. No carotid bruits noted. Pulmonary/Chest: Normal effort and positive vesicular breath sounds. No respiratory distress. No wheezes, rales or ronchi noted.  Abdomen: Normal bowel sounds.  Musculoskeletal: No difficulty with gait.  Neurological: Alert and oriented.  Resting tremor noted of right hand. Coordination normal.  Psychiatric: Mood and affect normal. Behavior is normal. Judgment and thought content normal.     BMET    Component Value Date/Time   NA 137 02/19/2024 1417   NA 145 (H) 04/09/2017 1431   K 4.4 02/19/2024 1417   CL 105 02/19/2024 1417   CO2 26 02/19/2024 1417   GLUCOSE 80 02/19/2024 1417   BUN 16 02/19/2024 1417   BUN 6 (L) 04/09/2017 1431   CREATININE 1.39 (H) 02/19/2024 1417   CALCIUM  9.6 02/19/2024 1417   GFRNONAA 45 (L) 09/04/2020 0941   GFRAA 52 (L) 09/04/2020 0941    Lipid Panel     Component Value Date/Time   CHOL 166 10/09/2023 0900   CHOL 253 (H) 08/08/2015 1323   TRIG 84 10/09/2023 0900   HDL 59 10/09/2023 0900   HDL 58 08/08/2015 1323   CHOLHDL 2.8 10/09/2023 0900   VLDL 27 03/03/2017 1058   LDLCALC 90 10/09/2023 0900    CBC    Component Value Date/Time  WBC 8.4 10/09/2023 0900   RBC 4.29 10/09/2023 0900   HGB 13.0 10/09/2023 0900   HGB 11.6 04/09/2017 1431   HCT 39.2 10/09/2023 0900   HCT 34.5 04/09/2017 1431   PLT 421 (H) 10/09/2023 0900   PLT 376 04/09/2017 1431    MCV 91.4 10/09/2023 0900   MCV 92 04/09/2017 1431   MCH 30.3 10/09/2023 0900   MCHC 33.2 10/09/2023 0900   RDW 12.6 10/09/2023 0900   RDW 13.7 04/09/2017 1431   LYMPHSABS 1,699 09/04/2020 0941   LYMPHSABS 1.6 04/09/2017 1431   MONOABS 385 03/03/2017 1058   EOSABS 158 09/04/2020 0941   EOSABS 0.2 04/09/2017 1431   BASOSABS 79 09/04/2020 0941   BASOSABS 0.0 04/09/2017 1431    Hgb A1C Lab Results  Component Value Date   HGBA1C 5.5 10/09/2023           Assessment & Plan:     RTC in 6 months for your annual exam [name], NP

## 2024-04-20 NOTE — Assessment & Plan Note (Signed)
 Stable on citalopram  40 mg and aripiprazole  5 mg daily as previously prescribed Support offered

## 2024-04-20 NOTE — Assessment & Plan Note (Signed)
 Will trial trelegy 160-62.5-25 mcg/act

## 2024-04-20 NOTE — Assessment & Plan Note (Signed)
 C-Met today Continue losartan  50 mg daily for renal protection She will continue to follow with nephrology

## 2024-04-20 NOTE — Patient Instructions (Signed)
 Osteopenia  Osteopenia is a loss of thickness (density) inside the bones. Another name for osteopenia is low bone mass. Mild osteopenia is a normal part of aging. It is not a disease, and it does not cause symptoms. However, if you have osteopenia and continue to lose bone mass, you could develop a condition that causes the bones to become thin and break more easily (osteoporosis). Osteoporosis can cause you to lose some height, have back pain, and have a stooped posture. Although osteopenia is not a disease, making changes to your lifestyle and diet can help to prevent osteopenia from developing into osteoporosis. What are the causes? Osteopenia is caused by loss of calcium in the bones. Bones are constantly changing. Old bone cells are continually being replaced with new bone cells. This process builds new bone. The mineral calcium is needed to build new bone and maintain bone density. Bone density is usually highest around age 63. After that, most people's bodies cannot replace all the bone they have lost with new bone. What increases the risk? You are more likely to develop this condition if: You are older than age 73. You are a woman who went through menopause early. You have a long illness that keeps you in bed. You do not get enough exercise. You lack certain nutrients (malnutrition). You have an overactive thyroid gland (hyperthyroidism). You use products that contain nicotine or tobacco, such as cigarettes, e-cigarettes and chewing tobacco, or you drink a lot of alcohol. You are taking medicines that weaken the bones, such as steroids. What are the signs or symptoms? This condition does not cause any symptoms. You may have a slightly higher risk for bone breaks (fractures), so getting fractures more easily than normal may be an indication of osteopenia. How is this diagnosed? This condition may be diagnosed based on an X-ray exam that measures bone density (dual-energy X-ray  absorptiometry, or DEXA). This test can measure bone density in your hips, spine, and wrists. Osteopenia has no symptoms, so this condition is usually diagnosed after a routine bone density screening test is done for osteoporosis. This routine screening is usually done for: Women who are age 68 or older. Men who are age 26 or older. If you have risk factors for osteopenia, you may have the screening test at an earlier age. How is this treated? Making dietary and lifestyle changes can lower your risk for osteoporosis. If you have severe osteopenia that is close to becoming osteoporosis, this condition can be treated with medicines and dietary supplements such as calcium and vitamin D . These supplements help to rebuild bone density. Follow these instructions at home: Eating and drinking Eat a diet that is high in calcium and vitamin D . Calcium is found in dairy products, beans, salmon, and leafy green vegetables like spinach and broccoli. Look for foods that have vitamin D  and calcium added to them (fortified foods), such as orange juice, cereal, and bread.  Lifestyle Do 30 minutes or more of a weight-bearing exercise every day, such as walking, jogging, or playing a sport. These types of exercises strengthen the bones. Do not use any products that contain nicotine or tobacco, such as cigarettes, e-cigarettes, and chewing tobacco. If you need help quitting, ask your health care provider. Do not drink alcohol if: Your health care provider tells you not to drink. You are pregnant, may be pregnant, or are planning to become pregnant. If you drink alcohol: Limit how much you use to: 0-1 drink a day for women. 0-2  drinks a day for men. Be aware of how much alcohol is in your drink. In the [place] one drink equals one 12 oz bottle of beer (355 mL), one 5 oz glass of wine (148 mL), or one 1 oz glass of hard liquor (44 mL). General instructions Take over-the-counter and prescription medicines only as  told by your health care provider. These include vitamins and supplements. Take precautions at home to lower your risk of falling, such as: Keeping rooms well-lit and free of clutter, such as cords. Installing safety rails on stairs. Using rubber mats in the bathroom or other areas that are often wet or slippery. Keep all follow-up visits. This is important. Contact a health care provider if: You have not had a bone density screening for osteoporosis and you are: A woman who is age 25 or older. A man who is age 50 or older. You are a postmenopausal woman who has not had a bone density screening for osteoporosis. You are older than age 52 and you want to know if you should have bone density screening for osteoporosis. Summary Osteopenia is a loss of thickness (density) inside the bones. Another name for osteopenia is low bone mass. Osteopenia is not a disease, but it may increase your risk for a condition that causes the bones to become thin and break more easily (osteoporosis). You may be at risk for osteopenia if you are older than age 7 or if you are a woman who went through early menopause. Osteopenia does not cause any symptoms, but it can be diagnosed with a bone density screening test. Dietary and lifestyle changes are the first treatment for osteopenia. These may lower your risk for osteoporosis. This information is not intended to replace advice given to you by your health care provider. Make sure you discuss any questions you have with your health care provider. Document Revised: 04/07/2023 Document Reviewed: 04/07/2023 Elsevier Patient Education  2025 [employer].

## 2024-04-20 NOTE — Assessment & Plan Note (Addendum)
 Continue propranolol  120 mg daily and sinemet 25-100 mg TID as previously prescribed She will continue to follow with neurology

## 2024-04-20 NOTE — Assessment & Plan Note (Signed)
 C-Met and lipid profile today Encouraged her to consume a low-fat diet Continue atorvastatin  10 mg- 3 x weekly

## 2024-04-21 ENCOUNTER — Encounter: Payer: Self-pay | Admitting: Internal Medicine

## 2024-04-21 ENCOUNTER — Ambulatory Visit: Payer: Self-pay | Admitting: Internal Medicine

## 2024-04-21 LAB — HEMOGLOBIN A1C
Hgb A1c MFr Bld: 5.5 % (ref <5.7)
Mean Plasma Glucose: 111 mg/dL
eAG (mmol/L): 6.2 mmol/L

## 2024-04-21 LAB — LIPID PANEL
Cholesterol: 151 mg/dL (ref <200)
HDL: 52 mg/dL (ref >=50)
LDL Cholesterol (Calc): 82 mg/dL
Non-HDL Cholesterol (Calc): 99 mg/dL (ref <130)
Total CHOL/HDL Ratio: 2.9 (calc) (ref <5.0)
Triglycerides: 91 mg/dL (ref <150)

## 2024-04-25 ENCOUNTER — Encounter: Payer: Self-pay | Admitting: Internal Medicine

## 2024-05-03 ENCOUNTER — Ambulatory Visit: Payer: Self-pay | Admitting: Internal Medicine

## 2024-05-03 ENCOUNTER — Ambulatory Visit
Admission: RE | Admit: 2024-05-03 | Discharge: 2024-05-03 | Disposition: A | Discharge status: Home / Self Care | Source: Ambulatory Visit | Attending: Internal Medicine | Admitting: Internal Medicine

## 2024-05-03 DIAGNOSIS — Z780 Asymptomatic menopausal state: Secondary | ICD-10-CM

## 2024-05-03 DIAGNOSIS — Z1231 Encounter for screening mammogram for malignant neoplasm of breast: Secondary | ICD-10-CM | POA: Insufficient documentation

## 2024-05-03 DIAGNOSIS — M8580 Other specified disorders of bone density and structure, unspecified site: Secondary | ICD-10-CM | POA: Diagnosis not present

## 2024-05-23 ENCOUNTER — Telehealth: Admitting: Physician Assistant

## 2024-05-23 DIAGNOSIS — B9689 Other specified bacterial agents as the cause of diseases classified elsewhere: Secondary | ICD-10-CM | POA: Diagnosis not present

## 2024-05-23 DIAGNOSIS — J019 Acute sinusitis, unspecified: Secondary | ICD-10-CM

## 2024-05-23 MED ORDER — AMOXICILLIN-POT CLAVULANATE 875-125 MG PO TABS
1.0000 | ORAL_TABLET | Freq: Two times a day (BID) | ORAL | 0 refills | Status: AC
Start: 2024-05-23 — End: ?

## 2024-05-23 NOTE — Progress Notes (Signed)
 E-Visit for Sinus Problems  We are sorry that you are not feeling well.  Here is how we plan to help!  Based on what you have shared with me it looks like you have sinusitis.  Sinusitis is inflammation and infection in the sinus cavities of the head.  Based on your presentation I believe you most likely have Acute Bacterial Sinusitis.  This is an infection caused by bacteria and is treated with antibiotics. I have prescribed Augmentin 875mg /125mg  one tablet twice daily with food, for 7 days. You may use an oral decongestant such as Mucinex D or if you have glaucoma or high blood pressure use plain Mucinex. Saline nasal spray help and can safely be used as often as needed for congestion.  If you develop worsening sinus pain, fever or notice severe headache and vision changes, or if symptoms are not better after completion of antibiotic, please schedule an appointment with a health care provider.    Sinus infections are not as easily transmitted as other respiratory infection, however we still recommend that you avoid close contact with loved ones, especially the very young and elderly.  Remember to wash your hands thoroughly throughout the day as this is the number one way to prevent the spread of infection!  Home Care: Only take medications as instructed by your medical team. Complete the entire course of an antibiotic. Do not take these medications with alcohol. A steam or ultrasonic humidifier can help congestion.  You can place a towel over your head and breathe in the steam from hot water coming from a faucet. Avoid close contacts especially the very young and the elderly. Cover your mouth when you cough or sneeze. Always remember to wash your hands.  Get Help Right Away If: You develop worsening fever or sinus pain. You develop a severe head ache or visual changes. Your symptoms persist after you have completed your treatment plan.  Make sure you Understand these instructions. Will  watch your condition. Will get help right away if you are not doing well or get worse.  Your e-visit answers were reviewed by a board certified advanced clinical practitioner to complete your personal care plan.  Depending on the condition, your plan could have included both over the counter or prescription medications.  If there is a problem please reply  once you have received a response from your provider.  Your safety is important to us .  If you have drug allergies check your prescription carefully.    You can use MyChart to ask questions about today's visit, request a non-urgent call back, or ask for a work or school excuse for 24 hours related to this e-Visit. If it has been greater than 24 hours you will need to follow up with your provider, or enter a new e-Visit to address those concerns.  You will get an e-mail in the next two days asking about your experience.  I hope that your e-visit has been valuable and will speed your recovery. Thank you for using e-visits.  I have spent 5 minutes in review of e-visit questionnaire, review and updating patient chart, medical decision making and response to patient.   Elizabeth [name] CHRISTELLA Dickinson, PA-C

## 2024-06-21 ENCOUNTER — Other Ambulatory Visit: Payer: Self-pay | Admitting: Internal Medicine

## 2024-06-21 DIAGNOSIS — K219 Gastro-esophageal reflux disease without esophagitis: Secondary | ICD-10-CM

## 2024-06-23 NOTE — Telephone Encounter (Signed)
 Rx 04/20/24 #90 1RF- too soon Requested Prescriptions  Pending Prescriptions Disp Refills   pantoprazole  (PROTONIX ) 20 MG tablet [Pharmacy Med Name: PANTOPRAZOLE  SODIUM 20 MG Oral Tablet Delayed Release] 90 tablet 3    Sig: TAKE 1 TABLET EVERY DAY     Gastroenterology: Proton Pump Inhibitors Passed - 06/23/2024  4:30 PM      Passed - Valid encounter within last 12 months    Recent Outpatient Visits           2 months ago Centrilobular emphysema [facility])   [place] [facility] [place], Angeline ORN, NP   4 months ago Left leg swelling   [place] [facility] [place], Angeline ORN, NP   8 months ago Encounter for general adult medical examination with abnormal findings   [place] [facility] [place], Angeline ORN, NP   9 months ago Exposure to influenza   [facility] [place], Angeline ORN, NP

## 2024-06-24 DIAGNOSIS — M1990 Unspecified osteoarthritis, unspecified site: Secondary | ICD-10-CM | POA: Diagnosis not present

## 2024-06-24 DIAGNOSIS — E785 Hyperlipidemia, unspecified: Secondary | ICD-10-CM | POA: Diagnosis not present

## 2024-06-24 DIAGNOSIS — J439 Emphysema, unspecified: Secondary | ICD-10-CM | POA: Diagnosis not present

## 2024-06-24 DIAGNOSIS — F325 Major depressive disorder, single episode, in full remission: Secondary | ICD-10-CM | POA: Diagnosis not present

## 2024-06-24 DIAGNOSIS — M810 Age-related osteoporosis without current pathological fracture: Secondary | ICD-10-CM | POA: Diagnosis not present

## 2024-06-24 DIAGNOSIS — I2510 Atherosclerotic heart disease of native coronary artery without angina pectoris: Secondary | ICD-10-CM | POA: Diagnosis not present

## 2024-06-24 DIAGNOSIS — G219 Secondary parkinsonism, unspecified: Secondary | ICD-10-CM | POA: Diagnosis not present

## 2024-06-24 DIAGNOSIS — K219 Gastro-esophageal reflux disease without esophagitis: Secondary | ICD-10-CM | POA: Diagnosis not present

## 2024-07-15 ENCOUNTER — Ambulatory Visit (INDEPENDENT_AMBULATORY_CARE_PROVIDER_SITE_OTHER): Admitting: Vascular Surgery

## 2024-07-29 ENCOUNTER — Telehealth: Admitting: Family Medicine

## 2024-07-29 DIAGNOSIS — J019 Acute sinusitis, unspecified: Secondary | ICD-10-CM

## 2024-07-29 MED ORDER — IPRATROPIUM BROMIDE 0.03 % NA SOLN: 2.0000 | Freq: Two times a day (BID) | NASAL | 0 refills | Status: AC

## 2024-07-29 NOTE — Progress Notes (Signed)
 We are sorry that you are not feeling well.  Here is how we plan to help!  Based on what you have shared with me it looks like you have sinusitis.  Sinusitis is inflammation and infection in the sinus cavities of the head.  Based on your presentation I believe you most likely have Acute Viral Sinusitis.This is an infection most likely caused by a virus. There is not specific treatment for viral sinusitis other than to help you with the symptoms until the infection runs its course.  You may use an oral decongestant such as Mucinex D or if you have glaucoma or high blood pressure use plain Mucinex. Saline nasal spray help and can safely be used as often as needed for congestion, I have prescribed: Ipratropium Bromide  nasal spray 0.03% 2 sprays in eah nostril 2-3 times a day  Some authorities believe that zinc sprays or the use of Echinacea may shorten the course of your symptoms.  Sinus infections are not as easily transmitted as other respiratory infection, however we still recommend that you avoid close contact with loved ones, especially the very young and elderly.  Remember to wash your hands thoroughly throughout the day as this is the number one way to prevent the spread of infection!  Home Care: Only take medications as instructed by your medical team. Do not take these medications with alcohol. A steam or ultrasonic humidifier can help congestion.  You can place a towel over your head and breathe in the steam from hot water coming from a faucet. Avoid close contacts especially the very young and the elderly. Cover your mouth when you cough or sneeze. Always remember to wash your hands.  Get Help Right Away If: You develop worsening fever or sinus pain. You develop a severe head ache or visual changes. Your symptoms persist after you have completed your treatment plan.  Make sure you Understand these instructions. Will watch your condition. Will get help right away if you are not doing  well or get worse.  Your e-visit answers were reviewed by a board certified advanced clinical practitioner to complete your personal care plan.  Depending on the condition, your plan could have included both over the counter or prescription medications.  If there is a problem please reply  once you have received a response from your provider.  Your safety is important to us .  If you have drug allergies check your prescription carefully.    You can use MyChart to ask questions about today's visit, request a non-urgent call back, or ask for a work or school excuse for 24 hours related to this e-Visit. If it has been greater than 24 hours you will need to follow up with your provider, or enter a new e-Visit to address those concerns.  You will get an e-mail in the next two days asking about your experience.  I hope that your e-visit has been valuable and will speed your recovery. Thank you for using e-visits.  I have spent 5 minutes in review of e-visit questionnaire, review and updating patient chart, medical decision making and response to patient.   Roosvelt Mater, PA-C

## 2024-08-09 ENCOUNTER — Ambulatory Visit (INDEPENDENT_AMBULATORY_CARE_PROVIDER_SITE_OTHER): Admitting: Vascular Surgery

## 2024-08-12 ENCOUNTER — Encounter (INDEPENDENT_AMBULATORY_CARE_PROVIDER_SITE_OTHER): Payer: Self-pay

## 2024-08-12 ENCOUNTER — Ambulatory Visit (INDEPENDENT_AMBULATORY_CARE_PROVIDER_SITE_OTHER): Admitting: Vascular Surgery

## 2024-08-16 ENCOUNTER — Encounter (INDEPENDENT_AMBULATORY_CARE_PROVIDER_SITE_OTHER): Payer: Self-pay | Admitting: Vascular Surgery

## 2024-10-07 ENCOUNTER — Ambulatory Visit: Payer: Medicare HMO

## 2024-10-12 ENCOUNTER — Encounter: Admitting: Internal Medicine

## 2024-10-19 ENCOUNTER — Ambulatory Visit
